# Patient Record
Sex: Female | Born: 1960 | Race: White | Hispanic: No | Marital: Married | Smoking: Former smoker
Health system: Southern US, Community
[De-identification: ages and names within clinical notes are randomized; demographics above are authoritative.]

## PROBLEM LIST (undated history)

## (undated) DIAGNOSIS — E785 Hyperlipidemia, unspecified: Secondary | ICD-10-CM

## (undated) DIAGNOSIS — M1990 Unspecified osteoarthritis, unspecified site: Secondary | ICD-10-CM

## (undated) DIAGNOSIS — K5792 Diverticulitis of intestine, part unspecified, without perforation or abscess without bleeding: Secondary | ICD-10-CM

## (undated) DIAGNOSIS — IMO0002 Reserved for concepts with insufficient information to code with codable children: Secondary | ICD-10-CM

## (undated) DIAGNOSIS — I10 Essential (primary) hypertension: Secondary | ICD-10-CM

## (undated) DIAGNOSIS — K219 Gastro-esophageal reflux disease without esophagitis: Secondary | ICD-10-CM

## (undated) DIAGNOSIS — Z9889 Other specified postprocedural states: Secondary | ICD-10-CM

## (undated) DIAGNOSIS — Z5189 Encounter for other specified aftercare: Secondary | ICD-10-CM

## (undated) DIAGNOSIS — D649 Anemia, unspecified: Secondary | ICD-10-CM

## (undated) DIAGNOSIS — T7840XA Allergy, unspecified, initial encounter: Secondary | ICD-10-CM

## (undated) DIAGNOSIS — I493 Ventricular premature depolarization: Secondary | ICD-10-CM

## (undated) DIAGNOSIS — R112 Nausea with vomiting, unspecified: Secondary | ICD-10-CM

## (undated) DIAGNOSIS — F32A Depression, unspecified: Secondary | ICD-10-CM

## (undated) DIAGNOSIS — F419 Anxiety disorder, unspecified: Secondary | ICD-10-CM

## (undated) DIAGNOSIS — M797 Fibromyalgia: Secondary | ICD-10-CM

## (undated) HISTORY — PX: APPENDECTOMY: SHX54

## (undated) HISTORY — DX: Encounter for other specified aftercare: Z51.89

## (undated) HISTORY — PX: ABDOMINAL HYSTERECTOMY: SHX81

## (undated) HISTORY — PX: AV FISTULA INSERTION W/ RF MAGNETIC GUIDANCE: CATH118308

## (undated) HISTORY — DX: Gastro-esophageal reflux disease without esophagitis: K21.9

## (undated) HISTORY — PX: COLONOSCOPY: SHX174

## (undated) HISTORY — DX: Anemia, unspecified: D64.9

## (undated) HISTORY — PX: SMALL INTESTINE SURGERY: SHX150

## (undated) HISTORY — DX: Allergy, unspecified, initial encounter: T78.40XA

## (undated) HISTORY — DX: Hyperlipidemia, unspecified: E78.5

## (undated) HISTORY — PX: COLON SURGERY: SHX602

## (undated) HISTORY — PX: CHOLECYSTECTOMY: SHX55

## (undated) HISTORY — DX: Reserved for concepts with insufficient information to code with codable children: IMO0002

---

## 1994-04-13 HISTORY — PX: TUBAL LIGATION: SHX77

## 2005-04-13 HISTORY — PX: ABDOMINAL HYSTERECTOMY: SHX81

## 2006-04-13 HISTORY — PX: APPENDECTOMY: SHX54

## 2006-04-13 HISTORY — PX: BLADDER REPAIR: SHX76

## 2006-04-13 HISTORY — PX: VESICOVAGINAL FISTULA CLOSURE: SUR270

## 2008-08-16 DIAGNOSIS — F988 Other specified behavioral and emotional disorders with onset usually occurring in childhood and adolescence: Secondary | ICD-10-CM | POA: Insufficient documentation

## 2009-08-22 DIAGNOSIS — N3942 Incontinence without sensory awareness: Secondary | ICD-10-CM | POA: Insufficient documentation

## 2013-02-28 DIAGNOSIS — N3941 Urge incontinence: Secondary | ICD-10-CM | POA: Insufficient documentation

## 2014-10-31 DIAGNOSIS — F411 Generalized anxiety disorder: Secondary | ICD-10-CM | POA: Insufficient documentation

## 2014-10-31 DIAGNOSIS — F17200 Nicotine dependence, unspecified, uncomplicated: Secondary | ICD-10-CM | POA: Insufficient documentation

## 2014-10-31 DIAGNOSIS — L280 Lichen simplex chronicus: Secondary | ICD-10-CM | POA: Insufficient documentation

## 2014-10-31 DIAGNOSIS — F329 Major depressive disorder, single episode, unspecified: Secondary | ICD-10-CM | POA: Insufficient documentation

## 2014-12-20 DIAGNOSIS — F341 Dysthymic disorder: Secondary | ICD-10-CM | POA: Insufficient documentation

## 2015-02-22 DIAGNOSIS — S29011A Strain of muscle and tendon of front wall of thorax, initial encounter: Secondary | ICD-10-CM | POA: Insufficient documentation

## 2015-02-22 DIAGNOSIS — M940 Chondrocostal junction syndrome [Tietze]: Secondary | ICD-10-CM | POA: Insufficient documentation

## 2015-02-22 DIAGNOSIS — N644 Mastodynia: Secondary | ICD-10-CM | POA: Insufficient documentation

## 2015-11-12 DIAGNOSIS — Z8719 Personal history of other diseases of the digestive system: Secondary | ICD-10-CM | POA: Insufficient documentation

## 2015-11-13 DIAGNOSIS — K5792 Diverticulitis of intestine, part unspecified, without perforation or abscess without bleeding: Secondary | ICD-10-CM | POA: Insufficient documentation

## 2016-07-12 HISTORY — PX: COLONOSCOPY: SHX174

## 2017-02-12 ENCOUNTER — Ambulatory Visit: Payer: Self-pay | Admitting: Physician Assistant

## 2017-02-12 VITALS — BP 110/70 | HR 92 | Temp 98.5°F | Resp 16

## 2017-02-12 DIAGNOSIS — R319 Hematuria, unspecified: Principal | ICD-10-CM

## 2017-02-12 DIAGNOSIS — N390 Urinary tract infection, site not specified: Secondary | ICD-10-CM

## 2017-02-12 LAB — POCT URINALYSIS DIPSTICK
BILIRUBIN UA: NEGATIVE
Glucose, UA: NEGATIVE
KETONES UA: NEGATIVE
NITRITE UA: POSITIVE
PH UA: 7 (ref 5.0–8.0)
PROTEIN UA: NEGATIVE
Spec Grav, UA: 1.01 (ref 1.010–1.025)
Urobilinogen, UA: 0.2 E.U./dL

## 2017-02-12 MED ORDER — FLUCONAZOLE 150 MG PO TABS: ORAL_TABLET | ORAL | 0 refills | Status: DC

## 2017-02-12 MED ORDER — CIPROFLOXACIN HCL 250 MG PO TABS: 250.0000 mg | ORAL_TABLET | Freq: Two times a day (BID) | ORAL | 0 refills | Status: DC

## 2017-02-12 NOTE — Progress Notes (Signed)
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

## 2017-02-15 LAB — URINE CULTURE

## 2017-02-18 ENCOUNTER — Encounter: Payer: Self-pay | Admitting: Physician Assistant

## 2017-02-18 ENCOUNTER — Ambulatory Visit: Payer: Self-pay | Admitting: Physician Assistant

## 2017-02-18 VITALS — BP 134/90 | HR 85 | Temp 98.6°F

## 2017-02-18 DIAGNOSIS — H8110 Benign paroxysmal vertigo, unspecified ear: Secondary | ICD-10-CM

## 2017-02-18 DIAGNOSIS — G8929 Other chronic pain: Secondary | ICD-10-CM

## 2017-02-18 DIAGNOSIS — M2550 Pain in unspecified joint: Secondary | ICD-10-CM

## 2017-02-18 LAB — POCT URINALYSIS DIPSTICK
Bilirubin, UA: NEGATIVE
GLUCOSE UA: NEGATIVE
KETONES UA: NEGATIVE
Leukocytes, UA: NEGATIVE
Nitrite, UA: NEGATIVE
Protein, UA: NEGATIVE
SPEC GRAV UA: 1.025 (ref 1.010–1.025)
Urobilinogen, UA: 0.2 E.U./dL
pH, UA: 7 (ref 5.0–8.0)

## 2017-02-18 NOTE — Progress Notes (Signed)
S: Patient complains of being dizzy, being a little confused, is for getting her words, no headache, no slurred speech, no loss of use of limbs, no chest pain or shortness of breath. Patient recently started amitriptyline. She was also on Cipro for UTI. She has multiple areas of pain. She was tested for rheumatoid arthritis and lupus which were both negative. Her doctor had referred her to rheumatology but she moved during that time. States she does have joint pain in her hands, elbows, and some in her back. 2. Also has some anterior throat pain. States it hurts to swallow, hurts to touch. She is a smoker and is concerned about what may be causing the pain  O: vitals wnl, nad, perrl, speech is normal, patient is alert and oriented 3. TMs are clear lungs are clear to auscultation, cv rrr, neuro is grossly intact, goals are swollen bilaterally, left elbow is a little tender to palpation. UA is normal  A:? medication reaction to elavil, throat pain, joint pain  P: labs today, refer to ENT for throat pain, stop elavil, if sx worsen pt is to go to ER

## 2017-02-19 LAB — CMP12+LP+TP+TSH+6AC+CBC/D/PLT
A/G RATIO: 1.7 (ref 1.2–2.2)
ALT: 16 IU/L (ref 0–32)
AST: 17 IU/L (ref 0–40)
Albumin: 4.4 g/dL (ref 3.5–5.5)
Alkaline Phosphatase: 89 IU/L (ref 39–117)
BASOS ABS: 0 10*3/uL (ref 0.0–0.2)
BUN/Creatinine Ratio: 11 (ref 9–23)
BUN: 8 mg/dL (ref 6–24)
Basos: 1 %
Bilirubin Total: <0.2 mg/dL (ref 0.0–1.2)
CALCIUM: 9.4 mg/dL (ref 8.7–10.2)
CREATININE: 0.72 mg/dL (ref 0.57–1.00)
Chloride: 103 mmol/L (ref 96–106)
Chol/HDL Ratio: 5.1 ratio — ABNORMAL HIGH (ref 0.0–4.4)
Cholesterol, Total: 215 mg/dL — ABNORMAL HIGH (ref 100–199)
EOS (ABSOLUTE): 0.2 10*3/uL (ref 0.0–0.4)
ESTIMATED CHD RISK: 1.4 times avg. — AB (ref 0.0–1.0)
Eos: 2 %
Free Thyroxine Index: 1.8 (ref 1.2–4.9)
GFR calc Af Amer: 110 mL/min/{1.73_m2} (ref >59)
GFR, EST NON AFRICAN AMERICAN: 95 mL/min/{1.73_m2} (ref >59)
GGT: 27 IU/L (ref 0–60)
GLUCOSE: 99 mg/dL (ref 65–99)
Globulin, Total: 2.6 g/dL (ref 1.5–4.5)
HDL: 42 mg/dL (ref >39)
Hematocrit: 38.4 % (ref 34.0–46.6)
Hemoglobin: 13.1 g/dL (ref 11.1–15.9)
IRON: 56 ug/dL (ref 27–159)
Immature Grans (Abs): 0 10*3/uL (ref 0.0–0.1)
Immature Granulocytes: 0 %
LDH: 210 IU/L (ref 119–226)
LDL Calculated: 145 mg/dL — ABNORMAL HIGH (ref 0–99)
LYMPHS ABS: 3.4 10*3/uL — AB (ref 0.7–3.1)
Lymphs: 46 %
MCH: 32.3 pg (ref 26.6–33.0)
MCHC: 34.1 g/dL (ref 31.5–35.7)
MCV: 95 fL (ref 79–97)
Monocytes Absolute: 0.4 10*3/uL (ref 0.1–0.9)
Monocytes: 5 %
NEUTROS ABS: 3.5 10*3/uL (ref 1.4–7.0)
Neutrophils: 46 %
PHOSPHORUS: 4.3 mg/dL (ref 2.5–4.5)
PLATELETS: 296 10*3/uL (ref 150–379)
POTASSIUM: 4.1 mmol/L (ref 3.5–5.2)
RBC: 4.05 x10E6/uL (ref 3.77–5.28)
RDW: 13.7 % (ref 12.3–15.4)
Sodium: 141 mmol/L (ref 134–144)
T3 UPTAKE RATIO: 22 % — AB (ref 24–39)
T4 TOTAL: 8.1 ug/dL (ref 4.5–12.0)
TOTAL PROTEIN: 7 g/dL (ref 6.0–8.5)
TSH: 1.35 u[IU]/mL (ref 0.450–4.500)
Triglycerides: 139 mg/dL (ref 0–149)
URIC ACID: 2.6 mg/dL (ref 2.5–7.1)
VLDL Cholesterol Cal: 28 mg/dL (ref 5–40)
WBC: 7.5 10*3/uL (ref 3.4–10.8)

## 2017-02-19 LAB — B. BURGDORFI ANTIBODIES

## 2017-02-19 LAB — VITAMIN D 25 HYDROXY (VIT D DEFICIENCY, FRACTURES): VIT D 25 HYDROXY: 25.5 ng/mL — AB (ref 30.0–100.0)

## 2017-02-19 LAB — VITAMIN B12: Vitamin B-12: 418 pg/mL (ref 232–1245)

## 2017-03-03 NOTE — Progress Notes (Signed)
Per Neysa Bonitohristy at [facility] patient's appointment is 03/19/17 @ 9:15am.  I called and left a message on the patient's voicemail.

## 2017-05-07 DIAGNOSIS — K5790 Diverticulosis of intestine, part unspecified, without perforation or abscess without bleeding: Secondary | ICD-10-CM | POA: Insufficient documentation

## 2017-05-07 DIAGNOSIS — M797 Fibromyalgia: Secondary | ICD-10-CM | POA: Insufficient documentation

## 2017-05-07 DIAGNOSIS — G8929 Other chronic pain: Secondary | ICD-10-CM | POA: Insufficient documentation

## 2017-05-21 ENCOUNTER — Encounter: Payer: Self-pay | Admitting: *Deleted

## 2017-05-25 ENCOUNTER — Other Ambulatory Visit: Payer: Self-pay | Admitting: Internal Medicine

## 2017-05-25 DIAGNOSIS — K5792 Diverticulitis of intestine, part unspecified, without perforation or abscess without bleeding: Secondary | ICD-10-CM

## 2017-05-27 ENCOUNTER — Emergency Department: Payer: Managed Care, Other (non HMO)

## 2017-05-27 ENCOUNTER — Encounter: Payer: Self-pay | Admitting: Emergency Medicine

## 2017-05-27 ENCOUNTER — Emergency Department
Admission: EM | Admit: 2017-05-27 | Discharge: 2017-05-27 | Disposition: A | Discharge status: Home / Self Care | Payer: Managed Care, Other (non HMO) | Attending: Emergency Medicine | Admitting: Emergency Medicine

## 2017-05-27 ENCOUNTER — Other Ambulatory Visit: Payer: Self-pay

## 2017-05-27 DIAGNOSIS — Z79899 Other long term (current) drug therapy: Secondary | ICD-10-CM | POA: Insufficient documentation

## 2017-05-27 DIAGNOSIS — R109 Unspecified abdominal pain: Secondary | ICD-10-CM

## 2017-05-27 DIAGNOSIS — R1030 Lower abdominal pain, unspecified: Secondary | ICD-10-CM | POA: Diagnosis not present

## 2017-05-27 DIAGNOSIS — F17210 Nicotine dependence, cigarettes, uncomplicated: Secondary | ICD-10-CM | POA: Diagnosis not present

## 2017-05-27 HISTORY — DX: Diverticulitis of intestine, part unspecified, without perforation or abscess without bleeding: K57.92

## 2017-05-27 LAB — URINALYSIS, COMPLETE (UACMP) WITH MICROSCOPIC
BILIRUBIN URINE: NEGATIVE
Glucose, UA: NEGATIVE mg/dL
KETONES UR: NEGATIVE mg/dL
LEUKOCYTES UA: NEGATIVE
Nitrite: NEGATIVE
Protein, ur: NEGATIVE mg/dL
SPECIFIC GRAVITY, URINE: 1.005 (ref 1.005–1.030)
pH: 7 (ref 5.0–8.0)

## 2017-05-27 LAB — CBC WITH DIFFERENTIAL/PLATELET
BASOS ABS: 0.1 10*3/uL (ref 0–0.1)
BASOS PCT: 1 %
Eosinophils Absolute: 0.2 10*3/uL (ref 0–0.7)
Eosinophils Relative: 3 %
HEMATOCRIT: 43.2 % (ref 35.0–47.0)
HEMOGLOBIN: 14.5 g/dL (ref 12.0–16.0)
Lymphocytes Relative: 33 %
Lymphs Abs: 2.2 10*3/uL (ref 1.0–3.6)
MCH: 32.7 pg (ref 26.0–34.0)
MCHC: 33.6 g/dL (ref 32.0–36.0)
MCV: 97.2 fL (ref 80.0–100.0)
MONOS PCT: 6 %
Monocytes Absolute: 0.4 10*3/uL (ref 0.2–0.9)
NEUTROS ABS: 4 10*3/uL (ref 1.4–6.5)
NEUTROS PCT: 57 %
Platelets: 298 10*3/uL (ref 150–440)
RBC: 4.44 MIL/uL (ref 3.80–5.20)
RDW: 12.7 % (ref 11.5–14.5)
WBC: 6.9 10*3/uL (ref 3.6–11.0)

## 2017-05-27 LAB — COMPREHENSIVE METABOLIC PANEL
ALK PHOS: 76 U/L (ref 38–126)
ALT: 22 U/L (ref 14–54)
AST: 37 U/L (ref 15–41)
Albumin: 4.5 g/dL (ref 3.5–5.0)
Anion gap: 9 (ref 5–15)
BILIRUBIN TOTAL: 0.8 mg/dL (ref 0.3–1.2)
BUN: 12 mg/dL (ref 6–20)
CALCIUM: 9.6 mg/dL (ref 8.9–10.3)
CO2: 26 mmol/L (ref 22–32)
Chloride: 104 mmol/L (ref 101–111)
Creatinine, Ser: 0.75 mg/dL (ref 0.44–1.00)
GFR calc Af Amer: >60 mL/min (ref >60)
GFR calc non Af Amer: >60 mL/min (ref >60)
GLUCOSE: 100 mg/dL — AB (ref 65–99)
Potassium: 3.8 mmol/L (ref 3.5–5.1)
SODIUM: 139 mmol/L (ref 135–145)
TOTAL PROTEIN: 8.2 g/dL — AB (ref 6.5–8.1)

## 2017-05-27 LAB — LIPASE, BLOOD: Lipase: 29 U/L (ref 11–51)

## 2017-05-27 MED ORDER — KETOROLAC TROMETHAMINE 30 MG/ML IJ SOLN
15.0000 mg | Freq: Once | INTRAMUSCULAR | Status: AC
  Administered 2017-05-27: 15 mg via INTRAVENOUS
  Filled 2017-05-27: qty 1

## 2017-05-27 MED ORDER — ONDANSETRON HCL 4 MG/2ML IJ SOLN
4.0000 mg | Freq: Once | INTRAMUSCULAR | Status: AC
Start: 2017-05-27 — End: 2017-05-27
  Administered 2017-05-27: 4 mg via INTRAVENOUS
  Filled 2017-05-27: qty 2

## 2017-05-27 MED ORDER — IOPAMIDOL (ISOVUE-300) INJECTION 61%
100.0000 mL | Freq: Once | INTRAVENOUS | Status: AC | PRN
  Administered 2017-05-27: 100 mL via INTRAVENOUS

## 2017-05-27 MED ORDER — ONDANSETRON 4 MG PO TBDP: 4.0000 mg | ORAL_TABLET | Freq: Three times a day (TID) | ORAL | 0 refills | Status: DC | PRN

## 2017-05-27 NOTE — ED Provider Notes (Signed)
[facility] Emergency Department Provider Note  ____________________________________________  Time seen: Approximately 9:08 AM  I have reviewed the triage vital signs and the nursing notes.   HISTORY  Chief Complaint Abdominal Pain; Nausea; Emesis; and Diarrhea   HPI Allison Alvarez is a 57 y.o. female with history of diverticulitis presents for evaluation of abdominal pain. Patient reports that this is her seventh bouts of diverticulitis in the last 13 months. This one started a week ago. 5 days ago she was started on Cipro and Flagyl by her PCP. Since yesterday evening the pain has gotten worse. She is complaining of pain on the right side of her abdomen, 5 out of 10, constant, nonradiating. She is also complaining of pressure in her suprapubic region but no dysuria or hematuria. She has had nausea but no vomiting, no fever or chills, no diarrhea or constipation.  Past Medical History:  Diagnosis Date  . Diverticulitis     There are no active problems to display for this patient.   Past Surgical History:  Procedure Laterality Date  . ABDOMINAL HYSTERECTOMY    . APPENDECTOMY    . AV FISTULA INSERTION W/ RF MAGNETIC GUIDANCE    . BLADDER REPAIR    . CHOLECYSTECTOMY      Prior to Admission medications   Medication Sig Start Date End Date Taking? Authorizing Provider  Acidophilus Lactobacillus CAPS Take 1 capsule by mouth every morning.   Yes [provider]  ciprofloxacin (CIPRO) 500 MG tablet Take 1 tablet by mouth 2 (two) times daily. For 14 days 05/21/17 06/04/17 Yes [provider]  citalopram (CELEXA) 40 MG tablet TAKE 1 TABLET(40 MG) BY MOUTH DAILY 01/30/17  Yes [provider]  magnesium oxide (MAG-OX) 400 MG tablet Take 400 mg by mouth daily.   Yes [provider]  metroNIDAZOLE (FLAGYL) 500 MG tablet Take 1 tablet by mouth 2 (two) times daily. For 14 days 05/21/17  Yes [provider]  Omega-3 Fatty  Acids (FISH OIL/SUPER POTENT/NO BURP) 1000 MG CAPS Take 1 capsule by mouth daily.   Yes [provider]  fluconazole (DIFLUCAN) 150 MG tablet Take one now and one in a week Patient not taking: Reported on 02/18/2017 02/12/17   Faythe Ghee, PA-C  ondansetron (ZOFRAN ODT) 4 MG disintegrating tablet Take 1 tablet (4 mg total) by mouth every 8 (eight) hours as needed for nausea or vomiting. 05/27/17   Nita Sickle, MD    Allergies Morphine and Hydromorphone  History reviewed. No pertinent family history.  Social History Social History   Tobacco Use  . Smoking status: Current Every Day Smoker    Packs/day: 1.00  . Smokeless tobacco: Never Used  Substance Use Topics  . Alcohol use: No    Frequency: Never  . Drug use: No    Review of Systems  Constitutional: Negative for fever. Eyes: Negative for visual changes. ENT: Negative for sore throat. Neck: No neck pain  Cardiovascular: Negative for chest pain. Respiratory: Negative for shortness of breath. Gastrointestinal: + abdominal pain and nausea. No vomiting or diarrhea. Genitourinary: Negative for dysuria. Musculoskeletal: Negative for back pain. Skin: Negative for rash. Neurological: Negative for headaches, weakness or numbness. Psych: No SI or HI  ____________________________________________   PHYSICAL EXAM:  VITAL SIGNS: ED Triage Vitals  Enc Vitals Group     BP 05/27/17 0847 139/88     Pulse Rate 05/27/17 0847 73     Resp 05/27/17 0847 20     Temp  05/27/17 0847 98.2 F (36.8 C)     Temp Source 05/27/17 0847 Oral     SpO2 05/27/17 0847 98 %     Weight 05/27/17 0848 177 lb (80.3 kg)     Height 05/27/17 0848 5\' 8"  (1.727 m)     Head Circumference --      Peak Flow --      Pain Score 05/27/17 0847 6     Pain Loc --      Pain Edu? --      Excl. in GC? --     Constitutional: Alert and oriented. Well appearing and in no apparent distress. HEENT:      Head: Normocephalic and atraumatic.          Eyes: Conjunctivae are normal. Sclera is non-icteric.       Mouth/Throat: Mucous membranes are moist.       Neck: Supple with no signs of meningismus. Cardiovascular: Regular rate and rhythm. No murmurs, gallops, or rubs. 2+ symmetrical distal pulses are present in all extremities. No JVD. Respiratory: Normal respiratory effort. Lungs are clear to auscultation bilaterally. No wheezes, crackles, or rhonchi.  Gastrointestinal: Soft, mild R sided ttp, and non distended with positive bowel sounds. No rebound or guarding. Genitourinary: No CVA tenderness. Musculoskeletal: Nontender with normal range of motion in all extremities. No edema, cyanosis, or erythema of extremities. Neurologic: Normal speech and language. Face is symmetric. Moving all extremities. No gross focal neurologic deficits are appreciated. Skin: Skin is warm, dry and intact. No rash noted. Psychiatric: Mood and affect are normal. Speech and behavior are normal.  ____________________________________________   LABS (all labs ordered are listed, but only abnormal results are displayed)  Labs Reviewed  COMPREHENSIVE METABOLIC PANEL - Abnormal; Notable for the following components:      Result Value   Glucose, Bld 100 (*)    Total Protein 8.2 (*)    All other components within normal limits  URINALYSIS, COMPLETE (UACMP) WITH MICROSCOPIC - Abnormal; Notable for the following components:   Color, Urine YELLOW (*)    APPearance CLEAR (*)    Hgb urine dipstick SMALL (*)    Bacteria, UA RARE (*)    Squamous Epithelial / LPF 0-5 (*)    All other components within normal limits  CBC WITH DIFFERENTIAL/PLATELET  LIPASE, BLOOD   ____________________________________________  EKG  none  ____________________________________________  RADIOLOGY  Interpreted by me: CT a/p: CT scan showing diverticulosis with no evidence of acute diverticulitis or complications.   Interpretation by Radiologist:  Ct Abdomen Pelvis W  Contrast  Result Date: 05/27/2017 CLINICAL DATA:  Right-sided abdominal pain. History of diverticulitis. EXAM: CT ABDOMEN AND PELVIS WITH CONTRAST TECHNIQUE: Multidetector CT imaging of the abdomen and pelvis was performed using the standard protocol following bolus administration of intravenous contrast. CONTRAST:  ISOVUE-300 IOPAMIDOL (ISOVUE-300) INJECTION 61% COMPARISON:  CT abdomen pelvis dated January 16, 2015. FINDINGS: Lower chest: No acute abnormality. Hepatobiliary: No focal liver abnormality is seen. Status post cholecystectomy. No biliary dilatation. Pancreas: Unremarkable. No pancreatic ductal dilatation or surrounding inflammatory changes. Spleen: Normal in size without focal abnormality. Adrenals/Urinary Tract: Adrenal glands are unremarkable. Kidneys are normal, without renal calculi, focal lesion, or hydronephrosis. Bladder is unremarkable. Stomach/Bowel: Stomach is within normal limits. Appendix is surgically absent. No evidence of bowel wall thickening, distention, or inflammatory changes. Moderate left-sided colonic diverticulosis. Vascular/Lymphatic: No significant vascular findings are present. No enlarged abdominal or pelvic lymph nodes. Reproductive: Status post hysterectomy. No adnexal masses. 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 Other: No free fluid or pneumoperitoneum. Musculoskeletal: No acute or significant osseous findings. IMPRESSION: 1.  No acute intra-abdominal process. 2. Moderate left-sided colonic diverticulosis without evidence of diverticulitis. 3. Unchanged urethral diverticulum. Electronically Signed   By: Obie DredgeWilliam T Derry M.D.   On: 05/27/2017 10:13      ____________________________________________   PROCEDURES  Procedure(s) performed: None Procedures Critical Care performed:   None ____________________________________________   INITIAL IMPRESSION / ASSESSMENT AND PLAN / ED COURSE   57 y.o. female with history of diverticulitis presents for evaluation of worsening abdominal pain and nausea after 5 days of flagyl and cipro for diverticulitis. Patient is well-appearing, in no distress, she has normal vital signs, abdomen is soft with mild tenderness on the right quadrants, no rebound or guarding. We'll check labs and do a CT abdomen and pelvis to rule out any complications from diverticulitis. Patient is status post cholecystectomy and appendectomy.    _________________________ 10:23 AM on 05/27/2017 -----------------------------------------  CT scan showing diverticulosis with no acute diverticulitis or any other complications. Labs are within normal limits. Abdomen remains benign. At this time I recommended patient continues to take the Flagyl and Cipro she was started on by her PCP. I will provide her with a prescription for Zofran. She will follow-up with her surgeon. Discussed return precautions with patient and her husband. Patient is now stable for discharge.   As part of my medical decision making, I reviewed the following data within the electronic MEDICAL RECORD NUMBER Nursing notes reviewed and incorporated, Labs reviewed , Radiograph reviewed , Notes from prior ED visits and [place] Controlled Substance Database    Pertinent labs & imaging results that were available during my care of the patient were reviewed by me and considered in my medical decision making (see chart for details).    ____________________________________________   FINAL CLINICAL IMPRESSION(S) / ED DIAGNOSES  Final diagnoses:  Abdominal pain, unspecified abdominal location      NEW MEDICATIONS STARTED DURING THIS VISIT:  ED Discharge Orders        Ordered    ondansetron (ZOFRAN ODT) 4 MG disintegrating tablet  Every 8 hours PRN     05/27/17 1022       Note:  This document was  prepared using Dragon voice recognition software and may include unintentional dictation errors.    Don PerkingVeronese, [place], MD 05/27/17 1024

## 2017-05-27 NOTE — Discharge Instructions (Signed)

## 2017-05-27 NOTE — ED Notes (Signed)
First nurse note  [facility]  Has been treat for diverticulitis and on 6 days of cipro and flagyl  No improvement  States pain is moving from LLQ to RLQ

## 2017-05-27 NOTE — ED Triage Notes (Signed)
Pt reports that she has diverticulitis has been on Flagyl and Cipro for this. She has had this 7 times since last Jan. She has been referred for a resection. She is waiting on call back from Dr. Birdie SonsByrnette. She is scheduled for a CT on the 22 of Feb. States symptoms are getting worse.

## 2017-05-27 NOTE — ED Notes (Signed)
Patient ambulatory to lobby with steady gait and NAD noted. Verbalized understanding of discharge instructions and follow-up care.  

## 2017-06-04 ENCOUNTER — Ambulatory Visit: Payer: Self-pay

## 2017-06-15 ENCOUNTER — Ambulatory Visit: Payer: Managed Care, Other (non HMO) | Admitting: General Surgery

## 2017-06-15 ENCOUNTER — Encounter: Payer: Self-pay | Admitting: General Surgery

## 2017-06-15 VITALS — BP 140/80 | HR 79 | Resp 12 | Ht 68.0 in | Wt 180.0 lb

## 2017-06-15 DIAGNOSIS — K5732 Diverticulitis of large intestine without perforation or abscess without bleeding: Secondary | ICD-10-CM | POA: Diagnosis not present

## 2017-06-15 NOTE — Progress Notes (Signed)
Patient ID: Allison Alvarez, female   DOB: 08-13-1960, 56 y.o.   MRN: [id number]  Chief Complaint  Patient presents with  . Other    HPI Allison Alvarez is a 57 y.o. female here today for a evaluation of diverticulitis. Patient states she has been having abdmonial pain for the last two years in her left lower quadrant and radiations to her back. Her first spell was after eating popcorn. She has had about seven episodes. She moves [place] where her doctor told her she need a colon resect. She had a colonoscopy last April 2018. Moves her bowels daily. Ct scan done on 05/27/2017.   Mother, Allison Alvarez  Is present at visit.   HPI  Past Medical History:  Diagnosis Date  . Diverticulitis     Past Surgical History:  Procedure Laterality Date  . ABDOMINAL HYSTERECTOMY    . APPENDECTOMY    . AV FISTULA INSERTION W/ RF MAGNETIC GUIDANCE    . BLADDER REPAIR    . CHOLECYSTECTOMY    . COLONOSCOPY  07/2016    No family history on file.  Social History Social History   Tobacco Use  . Smoking status: Current Every Day Smoker    Packs/day: 1.00  . Smokeless tobacco: Never Used  Substance Use Topics  . Alcohol use: No    Frequency: Never  . Drug use: No    Allergies  Allergen Reactions  . Morphine Itching  . Hydromorphone Itching    Current Outpatient Medications  Medication Sig Dispense Refill  . Acidophilus Lactobacillus CAPS Take 1 capsule by mouth every morning.    . citalopram (CELEXA) 40 MG tablet TAKE 1 TABLET(40 MG) BY MOUTH DAILY    . fluconazole (DIFLUCAN) 150 MG tablet Take one now and one in a week (Patient not taking: Reported on 02/18/2017) 2 tablet 0  . magnesium oxide (MAG-OX) 400 MG tablet Take 400 mg by mouth daily.    . metroNIDAZOLE (FLAGYL) 500 MG tablet Take 1 tablet by mouth 2 (two) times daily. For 14 days  0  . Omega-3 Fatty Acids (FISH OIL/SUPER POTENT/NO BURP) 1000 MG CAPS Take 1 capsule by mouth daily.    . ondansetron (ZOFRAN ODT) 4 MG disintegrating  tablet Take 1 tablet (4 mg total) by mouth every 8 (eight) hours as needed for nausea or vomiting. 20 tablet 0   No current facility-administered medications for this visit.     Review of Systems Review of Systems  Constitutional: Negative.   Respiratory: Negative.   Gastrointestinal: Positive for abdominal pain and nausea.    Blood pressure 140/80, pulse 79, resp. rate 12, height 5\' 8"  (1.727 m), weight 180 lb (81.6 kg).  Physical Exam Physical Exam  Constitutional: She is oriented to person, place, and time. She appears well-developed and well-nourished.  Eyes: Conjunctivae are normal. No scleral icterus.  Neck: Neck supple.  Cardiovascular: Normal rate, regular rhythm and normal heart sounds.  Pulmonary/Chest: Effort normal and breath sounds normal. Right breast exhibits no inverted nipple, no mass, no nipple discharge, no skin change and no tenderness. Left breast exhibits no inverted nipple, no mass, no nipple discharge, no skin change and no tenderness.  Abdominal: Soft. Bowel sounds are normal. There is tenderness.    Lymphadenopathy:    She has no cervical adenopathy.    She has no axillary adenopathy.  Neurological: She is alert and oriented to person, place, and time.  Skin: Skin is warm and dry.    Data Reviewed Laboratory studies dated May 27, 2017 showed normal electrolytes and renal function, creatinine 0.75 with an estimated GFR greater than 60.  Normal liver function studies.  Nonfasting blood sugar 100.  CBC of the same date showed a white blood cell count of 6900 with 57% polys, 33% lymphocytes.  Hemoglobin 14.5 with a platelet count of 298,000.  Lipase normal at 29.  CT scan of the abdomen and pelvis dated May 27, 2017 was reviewed.  No evidence of acute diverticular inflammation.  Evidence of diverticuli especially in the sigmoid colon appreciated. No acute intra-abdominal process.  Assessment    Multiple episodes of diverticulitis by report,  possible inflammation or so than infection on her most recent bout in February 2019.    Plan Reviewed physiology of diverticulitis as both possibly of infectious or inflammation.  She reported that her symptoms when she presented to the ED on February 14 were similar to her past episodes when she was treated with antibiotics.  Laboratory and CT imaging does not support acute diverticulitis at the time of her February 2019 exam.  Before moving to sigmoid colectomy, would like to make use of a trial of a high-fiber diet to see if this improves her symptoms.  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  It may be difficult for her to stop smoking, although the effects of smoking both on her episodes of diverticulitis and potential recovery from surgery was reviewed.  Patient to return in one month. The patient will be encouraged to make use of a daily fiber supplement. The patient is aware to call back for any questions or concerns.   HPI, Physical Exam, Assessment and Plan have been scribed under the direction and in the presence of Donnalee CurryJeffrey Ilynn Stauffer, MD.  Ples SpecterJessica Qualls, CMA  I have completed the exam and reviewed the above documentation for accuracy and completeness.  I agree with the above.  [employer] has been used and any errors in dictation or transcription are unintentional.  Donnalee CurryJeffrey Eldo Umanzor, M.D., F.A.C.S.  Merrily PewJeffrey W Cimone Fahey 06/16/2017, 8:15 PM

## 2017-06-15 NOTE — Patient Instructions (Signed)
Patient to return in one month. The patient will be encouraged to make use of a daily fiber supplement. . Less smoking is better . The patient is aware to call back for any questions or concerns.

## 2017-06-16 DIAGNOSIS — K5732 Diverticulitis of large intestine without perforation or abscess without bleeding: Secondary | ICD-10-CM | POA: Insufficient documentation

## 2017-07-08 ENCOUNTER — Other Ambulatory Visit
Admission: RE | Admit: 2017-07-08 | Discharge: 2017-07-08 | Disposition: A | Discharge status: Home / Self Care | Payer: Managed Care, Other (non HMO) | Source: Ambulatory Visit | Attending: General Surgery | Admitting: General Surgery

## 2017-07-08 ENCOUNTER — Telehealth: Payer: Self-pay | Admitting: *Deleted

## 2017-07-08 DIAGNOSIS — K5732 Diverticulitis of large intestine without perforation or abscess without bleeding: Secondary | ICD-10-CM

## 2017-07-08 LAB — CBC WITH DIFFERENTIAL/PLATELET
BASOS PCT: 1 %
Basophils Absolute: 0.1 10*3/uL (ref 0–0.1)
Eosinophils Absolute: 0.2 10*3/uL (ref 0–0.7)
Eosinophils Relative: 2 %
HEMATOCRIT: 37.2 % (ref 35.0–47.0)
Hemoglobin: 12.9 g/dL (ref 12.0–16.0)
LYMPHS PCT: 32 %
Lymphs Abs: 2.8 10*3/uL (ref 1.0–3.6)
MCH: 33.3 pg (ref 26.0–34.0)
MCHC: 34.5 g/dL (ref 32.0–36.0)
MCV: 96.3 fL (ref 80.0–100.0)
MONOS PCT: 4 %
Monocytes Absolute: 0.4 10*3/uL (ref 0.2–0.9)
NEUTROS ABS: 5.3 10*3/uL (ref 1.4–6.5)
Neutrophils Relative %: 61 %
Platelets: 253 10*3/uL (ref 150–440)
RBC: 3.86 MIL/uL (ref 3.80–5.20)
RDW: 12.9 % (ref 11.5–14.5)
WBC: 8.7 10*3/uL (ref 3.6–11.0)

## 2017-07-08 NOTE — Telephone Encounter (Signed)
Dr Lemar LivingsByrnett would like to obtain the hard copy colonoscopy report from April 2018. Patient states [facility] Dr Micah NoelLance was who did this and she will call to get that sent over, aware original hard color copy needed, pt agrees. [facility], order sent.

## 2017-07-08 NOTE — Telephone Encounter (Signed)
Patient called stating that Dr.Byrnett treats her for diverticulitis and the past 3 days she has noticed blood when she has a BM. The first 2 days it was just in the toilet, bright red blood, and then today was in the toilet and also in the stool. She has no pain with it but occasionally she has some stabbing pain on the lower left side of her abdomen but patient stated that it wasn't anything to cause to her bend over during the pain. She has an appointment on 07/15/17 but she didn't know if she needs to be seen sooner or if there was something she can do or take until then.

## 2017-07-10 ENCOUNTER — Encounter: Payer: Self-pay | Admitting: General Surgery

## 2017-07-13 NOTE — Telephone Encounter (Signed)
Yes, your labs were good, follow up as scheduled. thanks

## 2017-07-13 NOTE — Telephone Encounter (Deleted)
Labs are good follow up 07-15-17 as planned.

## 2017-07-15 ENCOUNTER — Ambulatory Visit: Payer: Managed Care, Other (non HMO) | Admitting: General Surgery

## 2017-10-26 ENCOUNTER — Other Ambulatory Visit: Payer: Self-pay | Admitting: Internal Medicine

## 2017-10-26 DIAGNOSIS — N644 Mastodynia: Secondary | ICD-10-CM

## 2017-10-27 ENCOUNTER — Encounter: Payer: Self-pay | Admitting: *Deleted

## 2017-11-08 ENCOUNTER — Other Ambulatory Visit: Payer: Self-pay | Admitting: *Deleted

## 2017-11-08 ENCOUNTER — Other Ambulatory Visit: Payer: Self-pay | Admitting: Internal Medicine

## 2017-11-08 ENCOUNTER — Inpatient Hospital Stay
Admission: RE | Admit: 2017-11-08 | Discharge: 2017-11-08 | Disposition: A | Discharge status: Home / Self Care | Payer: Self-pay | Source: Ambulatory Visit | Attending: *Deleted | Admitting: *Deleted

## 2017-11-08 DIAGNOSIS — N644 Mastodynia: Secondary | ICD-10-CM

## 2017-11-08 DIAGNOSIS — Z1231 Encounter for screening mammogram for malignant neoplasm of breast: Secondary | ICD-10-CM

## 2017-11-11 ENCOUNTER — Ambulatory Visit
Admission: RE | Admit: 2017-11-11 | Discharge: 2017-11-11 | Disposition: A | Discharge status: Home / Self Care | Payer: Managed Care, Other (non HMO) | Source: Ambulatory Visit | Attending: Internal Medicine | Admitting: Internal Medicine

## 2017-11-11 DIAGNOSIS — N644 Mastodynia: Secondary | ICD-10-CM

## 2017-12-02 ENCOUNTER — Ambulatory Visit: Payer: Managed Care, Other (non HMO) | Admitting: General Surgery

## 2018-01-06 ENCOUNTER — Ambulatory Visit: Payer: Self-pay | Admitting: Family Medicine

## 2018-01-06 VITALS — BP 130/70 | HR 72 | Temp 98.6°F | Resp 16

## 2018-01-06 DIAGNOSIS — R059 Cough, unspecified: Secondary | ICD-10-CM

## 2018-01-06 DIAGNOSIS — R05 Cough: Secondary | ICD-10-CM

## 2018-01-06 DIAGNOSIS — H6983 Other specified disorders of Eustachian tube, bilateral: Secondary | ICD-10-CM

## 2018-01-06 MED ORDER — FLUTICASONE PROPIONATE 50 MCG/ACT NA SUSP: 2.0000 | Freq: Every day | NASAL | 1 refills | Status: DC

## 2018-01-06 MED ORDER — BENZONATATE 200 MG PO CAPS: 200.0000 mg | ORAL_CAPSULE | Freq: Every evening | ORAL | 0 refills | Status: DC | PRN

## 2018-01-06 NOTE — Progress Notes (Signed)
Subjective: congestion     Allison Alvarez is a 57 y.o. female who presents for evaluation of nasal congestion with clear drainage, productive cough with green sputum, bilateral ear pressure/fullness, and sneezing for 1 week.  Patient reports a slow gradual increase in ear pressure, otherwise improvement in other symptoms. Treatment to date: Alka-Seltzer, NyQuil, DayQuil.  Denies rash, nausea, vomiting, diarrhea, SOB, wheezing, chest or back pain, ear pain, sore throat, difficulty swallowing, confusion, purulent nasal discharge, anosmia/hyposmia, dental pain, facial pressure/unilateral facial pain, pain exacerbated by bending over, headache, body aches, fatigue, fever, chills, severe symptoms, or initial improvement and then worsening of symptoms. History of smoking, asthma, COPD: Positive for tobacco use only.  Patient has smoked since she was in her 31s, smokes approximately a pack a day. History of recurrent sinus and/or lung infections: Negative.  Review of Systems Pertinent items noted in HPI and remainder of comprehensive ROS otherwise negative.     Objective:   Physical Exam General: Awake, alert, and oriented. No acute distress. Well developed, hydrated and nourished. Appears stated age. Nontoxic appearance.  HEENT: Clear PND noted. No erythema to posterior oropharynx.  No edema or exudates of pharynx or tonsils. No erythema or bulging of TM.  Clear air/fluid level noted to bilateral tympanic membranes.  Ear canals normal. Mild erythema/edema to nasal mucosa. Sinuses nontender. Supple neck without adenopathy. Cardiac: Heart rate and rhythm are normal. No murmurs, gallops, or rubs are auscultated. S1 and S2 are heard and are of normal intensity.  Respiratory: No signs of respiratory distress. Lungs clear. No tachypnea. Able to speak in full sentences without dyspnea. Nonlabored respirations.  Skin: Skin is warm, dry and intact. Appropriate color for ethnicity. No cyanosis noted.   Assessment:    Viral upper respiratory illness   Bilateral eustachian tube dysfunction  Plan:    Discussed diagnosis and treatment of URI and eustachian tube dysfunction. Discussed the importance of avoiding unnecessary antibiotic therapy. Suggested symptomatic OTC remedies. Nasal saline spray for congestion.   Prescribed Flonase and educated patient regarding instructions and side/adverse effects.  Patient has taken this in the past and tolerated it well. Prescribed Tessalon Perles to use as needed at night for cough.  Discussed side/adverse effects. Discussed red flag symptoms and circumstances with which to seek medical care.   New Prescriptions   BENZONATATE (TESSALON) 200 MG CAPSULE    Take 1 capsule (200 mg total) by mouth at bedtime as needed for cough.   FLUTICASONE (FLONASE) 50 MCG/ACT NASAL SPRAY    Place 2 sprays into both nostrils daily.

## 2018-01-27 ENCOUNTER — Telehealth: Payer: Self-pay

## 2018-01-27 NOTE — Telephone Encounter (Signed)
Message left for patient to call to reschedule her missed appointment with Dr Lemar Livings from 12/02/2017 for breast follow up.

## 2018-02-23 ENCOUNTER — Ambulatory Visit
Admission: RE | Admit: 2018-02-23 | Discharge: 2018-02-23 | Disposition: A | Discharge status: Home / Self Care | Payer: Managed Care, Other (non HMO) | Source: Ambulatory Visit | Attending: Family Medicine | Admitting: Family Medicine

## 2018-02-23 ENCOUNTER — Ambulatory Visit: Payer: Self-pay | Admitting: Family Medicine

## 2018-02-23 VITALS — BP 140/82 | HR 82 | Temp 98.5°F | Resp 14 | Ht 68.0 in | Wt 175.0 lb

## 2018-02-23 DIAGNOSIS — R05 Cough: Secondary | ICD-10-CM | POA: Diagnosis not present

## 2018-02-23 DIAGNOSIS — Z87891 Personal history of nicotine dependence: Secondary | ICD-10-CM | POA: Insufficient documentation

## 2018-02-23 DIAGNOSIS — R059 Cough, unspecified: Secondary | ICD-10-CM

## 2018-02-23 DIAGNOSIS — J209 Acute bronchitis, unspecified: Secondary | ICD-10-CM

## 2018-02-23 DIAGNOSIS — J029 Acute pharyngitis, unspecified: Secondary | ICD-10-CM

## 2018-02-23 LAB — POCT RAPID STREP A (OFFICE): Rapid Strep A Screen: NEGATIVE

## 2018-02-23 MED ORDER — DOXYCYCLINE HYCLATE 100 MG PO TABS: 100.0000 mg | ORAL_TABLET | Freq: Two times a day (BID) | ORAL | 0 refills | Status: AC

## 2018-02-23 MED ORDER — ALBUTEROL SULFATE HFA 108 (90 BASE) MCG/ACT IN AERS: 2.0000 | INHALATION_SPRAY | Freq: Four times a day (QID) | RESPIRATORY_TRACT | 0 refills | Status: DC | PRN

## 2018-02-23 MED ORDER — BENZONATATE 200 MG PO CAPS: 200.0000 mg | ORAL_CAPSULE | Freq: Every evening | ORAL | 0 refills | Status: DC | PRN

## 2018-02-23 NOTE — Progress Notes (Signed)
Subjective: sore throat and cough     Allison Alvarez is a 57 y.o. female who presents for evaluation of sore throat that began yesterday and cough and nasal congestion for 6 weeks.  Patient reports a long-standing history of tobacco use and having a "smoker's cough" (describes as nonproductive) but denies any diagnosis of COPD or asthma.  Reports that for the last month and a half she has had a mild productive cough with yellow/green sputum and nasal congestion, that has worsened in the last week. 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 Treatment to date: Flonase.   Denies rash, nausea, vomiting, diarrhea, SOB, wheezing, chest or back pain, ear pain, difficulty swallowing, confusion, weight loss, night sweats, TB exposure/risk factors, anosmia/hyposmia, dental pain, facial pressure/unilateral facial pain, pain exacerbated by bending over, headache, body aches, fatigue, fever, chills, ocular pruritis/discharge, severe symptoms, or initial improvement and then worsening of symptoms. History of smoking, asthma, COPD: Positive for tobacco use.  Denies any history of asthma or COPD. History of recurrent sinus and/or lung infections: Patient denies. Antibiotic use in the last 3 months: Patient denies.  Patient denies any other symptoms or concerns.  Review of Systems Pertinent items noted in HPI and remainder of comprehensive ROS otherwise negative.     Objective:   Physical Exam General: Awake, alert, and oriented. No acute distress. Well developed, hydrated and nourished. Appears stated age. Nontoxic appearance.  HEENT:  PND noted.  Mild erythema to posterior oropharynx.  No edema or exudates of pharynx or tonsils. No erythema or bulging of TM.  Mild erythema/edema to nasal mucosa. Sinuses nontender. Supple neck without adenopathy. Cardiac: Heart rate and rhythm are normal. No murmurs, gallops, or rubs are auscultated. S1  and S2 are heard and are of normal intensity.  Respiratory: No signs of respiratory distress. Lungs clear. No tachypnea. Able to speak in full sentences without dyspnea. Nonlabored respirations.  Skin: Skin is warm, dry and intact. Appropriate color for ethnicity. No cyanosis noted.   Diagnostic Results: Rapid strep test negative Chest x-ray  EXAM: CHEST - 2 VIEW  COMPARISON: None.  FINDINGS: Lungs are clear. Heart size and pulmonary vascularity are normal. No adenopathy. No bone lesions.  IMPRESSION: No edema or consolidation.   Electronically Signed By: Bretta Bang III M.D. On: 02/23/2018 10:55  Assessment:    bronchitis   Plan:   Discussed x-ray results with the patient. Discussed diagnosis and treatment of bronchitis. Suggested symptomatic OTC remedies. Nasal saline spray for congestion. Prescribed doxycycline.  Patient has taken this in the past and tolerated it well. Prescribed Tessalon Perles to use as needed at night for cough.  Patient reports these have been very helpful in the past. Prescribed as needed albuterol and discussed instructions for use.  Advised her to monitor her blood pressure while taking this and only use if she notices symptomatic improvement with use. Discussed side/adverse effects of medications. Discussed red flag symptoms and circumstances with which to seek medical care.  Advised the patient to follow-up with her primary care provider in 5 to 7 days if symptoms are not improving or sooner if symptoms worsen, she develops new symptoms, or has any concerns.  New Prescriptions   ALBUTEROL (PROVENTIL HFA;VENTOLIN HFA) 108 (90 BASE) MCG/ACT INHALER    Inhale 2 puffs into the lungs every 6 (six) hours as needed for wheezing or shortness of breath (or cough).   BENZONATATE (TESSALON) 200 MG CAPSULE  Take 1 capsule (200 mg total) by mouth at bedtime as needed for cough.   DOXYCYCLINE (VIBRA-TABS) 100 MG TABLET    Take 1 tablet (100 mg  total) by mouth 2 (two) times daily for 7 days.

## 2018-02-24 ENCOUNTER — Encounter: Payer: Self-pay | Admitting: *Deleted

## 2018-02-24 DIAGNOSIS — I10 Essential (primary) hypertension: Secondary | ICD-10-CM | POA: Insufficient documentation

## 2018-05-04 ENCOUNTER — Ambulatory Visit: Payer: Managed Care, Other (non HMO) | Admitting: Podiatry

## 2018-05-04 ENCOUNTER — Encounter: Payer: Self-pay | Admitting: Podiatry

## 2018-05-04 ENCOUNTER — Telehealth: Payer: Self-pay | Admitting: Podiatry

## 2018-05-04 ENCOUNTER — Ambulatory Visit (INDEPENDENT_AMBULATORY_CARE_PROVIDER_SITE_OTHER): Payer: Managed Care, Other (non HMO)

## 2018-05-04 VITALS — BP 132/80 | HR 75 | Resp 16 | Ht 68.0 in | Wt 175.0 lb

## 2018-05-04 DIAGNOSIS — M722 Plantar fascial fibromatosis: Secondary | ICD-10-CM

## 2018-05-04 MED ORDER — MELOXICAM 15 MG PO TABS: 15.0000 mg | ORAL_TABLET | Freq: Every day | ORAL | 3 refills | Status: DC

## 2018-05-04 MED ORDER — METHYLPREDNISOLONE 4 MG PO TABS: ORAL_TABLET | ORAL | 0 refills | Status: DC

## 2018-05-04 NOTE — Telephone Encounter (Signed)
I saw Dr. Logan Bores this morning and he was supposed to send prescriptions to [facility] on [address] in [place]. The pharmacy has not received those yet.

## 2018-05-04 NOTE — Progress Notes (Signed)
   Subjective:    Patient ID: Allison Alvarez, female    DOB: 02-16-1961, 58 y.o.   MRN: [id number]  HPI    Review of Systems  Musculoskeletal: Positive for arthralgias and myalgias.  All other systems reviewed and are negative.      Objective:   Physical Exam        Assessment & Plan:

## 2018-05-04 NOTE — Patient Instructions (Signed)

## 2018-05-15 NOTE — Progress Notes (Signed)
   Subjective: 58 year old female presenting today as a new patient with a chief complaint of pain and soreness to the left plantar heel that began about one month ago. She states it feels as if she is walking on a stone or a bruised area. Walking and standing increases her pain. She has been taking Ibuprofen for treatment. Patient is here for further evaluation and treatment.   Past Medical History:  Diagnosis Date  . Diverticulitis      Objective: Physical Exam General: The patient is alert and oriented x3 in no acute distress.  Dermatology: Skin is warm, dry and supple bilateral lower extremities. Negative for open lesions or macerations bilateral.   Vascular: Dorsalis Pedis and Posterior Tibial pulses palpable bilateral.  Capillary fill time is immediate to all digits.  Neurological: Epicritic and protective threshold intact bilateral.   Musculoskeletal: Tenderness to palpation to the plantar aspect of the left heel along the plantar fascia. All other joints range of motion within normal limits bilateral. Strength 5/5 in all groups bilateral.   Radiographic exam:   Normal osseous mineralization. Joint spaces preserved. No fracture/dislocation/boney destruction. No other soft tissue abnormalities or radiopaque foreign bodies.   Assessment: 1. Plantar fasciitis left foot  Plan of Care:  1. Patient evaluated. Xrays reviewed.   2. Injection of 0.5cc Celestone soluspan injected into the left plantar fascia.  3. Rx for Medrol Dose Pak placed 4. Rx for Meloxicam ordered for patient. 5. Plantar fascial band(s) dispensed  6. Instructed patient regarding therapies and modalities at home to alleviate symptoms.  7. Return to clinic in 4 weeks.     Felecia ShellingBrent M. Archer Vise, DPM [facility]  Dr. Felecia ShellingBrent M. Markell Sciascia, DPM    [address]                                     [place], [place] [address]                Office [phone]  Fax [phone]

## 2018-05-31 ENCOUNTER — Encounter: Payer: Managed Care, Other (non HMO) | Admitting: Podiatry

## 2018-06-08 NOTE — Progress Notes (Signed)
This encounter was created in error - please disregard.

## 2018-07-28 ENCOUNTER — Encounter: Payer: Self-pay | Admitting: Podiatry

## 2018-07-29 ENCOUNTER — Other Ambulatory Visit: Payer: Self-pay

## 2018-07-29 ENCOUNTER — Ambulatory Visit: Payer: Managed Care, Other (non HMO) | Admitting: Podiatry

## 2018-07-29 ENCOUNTER — Encounter: Payer: Self-pay | Admitting: Podiatry

## 2018-07-29 VITALS — Temp 98.5°F

## 2018-07-29 DIAGNOSIS — M722 Plantar fascial fibromatosis: Secondary | ICD-10-CM

## 2018-07-29 MED ORDER — METHYLPREDNISOLONE 4 MG PO TBPK: ORAL_TABLET | ORAL | 0 refills | Status: DC

## 2018-07-29 NOTE — Progress Notes (Signed)
   Subjective: 58 year old female presenting today for follow-up evaluation regarding left heel pain.  Patient states she is doing very well until she stepped on her dog's ball and reaggravated her plantar fascia.  She has been hurting ever since.  Patient states the injection and the Medrol Dosepak she received last visit helped significantly.  She presents for further treatment evaluation   Past Medical History:  Diagnosis Date  . Diverticulitis      Objective: Physical Exam General: The patient is alert and oriented x3 in no acute distress.  Dermatology: Skin is warm, dry and supple bilateral lower extremities. Negative for open lesions or macerations bilateral.   Vascular: Dorsalis Pedis and Posterior Tibial pulses palpable bilateral.  Capillary fill time is immediate to all digits.  Neurological: Epicritic and protective threshold intact bilateral.   Musculoskeletal: Tenderness to palpation to the plantar aspect of the left heel along the plantar fascia. All other joints range of motion within normal limits bilateral. Strength 5/5 in all groups bilateral.    Assessment: 1. Plantar fasciitis left foot  Plan of Care:  1. Patient evaluated.  2. Injection of 0.5cc Celestone soluspan injected into the left plantar fascia.  3. Rx for Medrol Dose Pak placed 4.  Continue meloxicam daily 5.  Continue plantar fascial brace as needed  6.  Recommend OTC insoles online since the sport stores are currently closed due to the COVID-19 pandemic.  Recommend Superfeet or Powersteps brands 7. Return to clinic PRN.     Felecia Shelling, DPM [facility]  Dr. Felecia Shelling, DPM    [address] [place], [place] [address]                Office [phone]  Fax [phone]

## 2018-09-01 ENCOUNTER — Encounter: Payer: Self-pay | Admitting: Podiatry

## 2018-09-01 NOTE — Telephone Encounter (Signed)
Please advise 

## 2018-09-02 ENCOUNTER — Other Ambulatory Visit: Payer: Self-pay

## 2018-09-02 MED ORDER — MELOXICAM 15 MG PO TABS: 15.0000 mg | ORAL_TABLET | Freq: Every day | ORAL | 3 refills | Status: DC

## 2018-09-02 NOTE — Telephone Encounter (Signed)
Pharmacy refill request for Meloxicam 15mg   Per Dr. Evans verbal order, ok to refill.   Script has been sent to pharmacy 

## 2018-09-16 ENCOUNTER — Encounter: Payer: Self-pay | Admitting: Podiatry

## 2018-09-16 ENCOUNTER — Ambulatory Visit (INDEPENDENT_AMBULATORY_CARE_PROVIDER_SITE_OTHER): Payer: Managed Care, Other (non HMO) | Admitting: Podiatry

## 2018-09-16 ENCOUNTER — Other Ambulatory Visit: Payer: Self-pay

## 2018-09-16 VITALS — Temp 96.7°F | Resp 16

## 2018-09-16 DIAGNOSIS — M722 Plantar fascial fibromatosis: Secondary | ICD-10-CM

## 2018-09-19 NOTE — Progress Notes (Signed)
   Subjective: 58 year old female presenting today for follow-up evaluation of plantar fasciitis of the left foot. She states her pain has not changed and the injection she last received provided some relief for about 5 days. She now reports intermittent moderate sharp pain and denies modifying factors. She has been taking Meloxicam and using the inserts as directed. Patient is here for further evaluation and treatment.   Past Medical History:  Diagnosis Date  . Diverticulitis      Objective: Physical Exam General: The patient is alert and oriented x3 in no acute distress.  Dermatology: Skin is warm, dry and supple bilateral lower extremities. Negative for open lesions or macerations bilateral.   Vascular: Dorsalis Pedis and Posterior Tibial pulses palpable bilateral.  Capillary fill time is immediate to all digits.  Neurological: Epicritic and protective threshold intact bilateral.   Musculoskeletal: Tenderness to palpation to the plantar aspect of the left heel along the plantar fascia. All other joints range of motion within normal limits bilateral. Strength 5/5 in all groups bilateral.    Assessment: 1. Plantar fasciitis left foot  Plan of Care:  1. Patient evaluated.  2. Injection of 0.5cc Celestone soluspan injected into the left plantar fascia.  3. Continue taking Meloxicam.  4. Continue using OTC insoles.  5. .Orders for physical therapy 3 times weekly for four weeks placed today.  6. Return to clinic in 4 weeks.      Edrick Kins, DPM [facility]  Dr. Edrick Kins, DPM    [address], [place] [address]                Office [phone]  Fax [phone]

## 2018-09-23 ENCOUNTER — Other Ambulatory Visit: Payer: Self-pay

## 2018-09-23 DIAGNOSIS — M722 Plantar fascial fibromatosis: Secondary | ICD-10-CM

## 2018-09-29 ENCOUNTER — Other Ambulatory Visit: Payer: Self-pay | Admitting: *Deleted

## 2018-09-29 ENCOUNTER — Ambulatory Visit: Payer: Managed Care, Other (non HMO) | Admitting: Adult Health

## 2018-09-29 ENCOUNTER — Other Ambulatory Visit: Payer: Self-pay

## 2018-09-29 ENCOUNTER — Encounter: Payer: Self-pay | Admitting: Adult Health

## 2018-09-29 ENCOUNTER — Encounter (INDEPENDENT_AMBULATORY_CARE_PROVIDER_SITE_OTHER): Payer: Self-pay

## 2018-09-29 ENCOUNTER — Telehealth: Payer: Self-pay | Admitting: General Practice

## 2018-09-29 DIAGNOSIS — Z20828 Contact with and (suspected) exposure to other viral communicable diseases: Secondary | ICD-10-CM | POA: Diagnosis not present

## 2018-09-29 DIAGNOSIS — Z20822 Contact with and (suspected) exposure to covid-19: Secondary | ICD-10-CM

## 2018-09-29 DIAGNOSIS — R6889 Other general symptoms and signs: Secondary | ICD-10-CM | POA: Diagnosis not present

## 2018-09-29 NOTE — Progress Notes (Signed)
Allergies  Allergen Reactions  . Morphine Itching  . Hydromorphone Itching    Virtual Visit via Telephone Note  I connected with Allison Alvarez on 09/29/18 at 10:00 AM EDT by telephone and verified that I am speaking with the correct person using two identifiers.  Location: Patient: at her home  Provider: [facility], [facility], [place]     I discussed the limitations, risks, security and privacy concerns of performing an evaluation and management service by telephone and the availability of in person appointments. I also discussed with the patient that there may be a patient responsible charge related to this service. The patient expressed understanding and agreed to proceed.   History of Present Illness:  (no vital signs available)  Patient is a 58 year old female in no acute distress who calls the clinic for a telephone visit.   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 She did not have a mask.  They stayed outside the entire time. She sat on porch with the covid positive patient, 10 feet away but client got angry and brushed up against patient in anger.  She was hollering and having heavy breathing  at the patient and many other family members. She was then in close to the patient up against her standing at the door. Papers were exchanged, different pen was used.  Patient did not wear  a mask.     Observations/Objective: Patient is alert and oriented and responsive to questions Engages in conversation with provider. Speaks in full sentences without any pauses without any shortness of breath or distress.   She has had diarrhea yesterday 09/28/18 afternoon several times.  She has mild congestion that started this morning 09/29/18, " feels like allergies"  Woke up  with it this morning. She did have a headache.  She is a smoker. Productive clear sputum mostly.   Patient  denies any fever, body aches,chills, rash, chest pain, shortness of breath, nausea, vomiting.     Assessment and Plan:    ICD-10-CM   1. Suspected Covid-19 Virus Infection  R68.89 Temperature monitoring  2. Close Exposure to Covid-19 Virus  Z20.828 Temperature monitoring     Follow Up Instructions: Work note given for remain out of work 10/09/18 or longer if symptoms persist. She has to be fever free 72 hours without fever reducers as well,  Covid test 6/18 and 6/19 to see if patient will meet test strategy criteria to return to work based on symptoms.    Advised patient call the office or your primary care doctor for an appointment if no improvement within 72 hours or if any symptoms change or worsen at any time  Advised ER or urgent Care if after hours or on weekend. Call 911 for emergency symptoms at any time.Patinet verbalized understanding of all instructions given/reviewed and treatment plan and has no further questions or concerns at this time.     I discussed the assessment and treatment plan with the patient. The patient was provided an opportunity to ask questions and all were answered. The patient agreed with the plan and demonstrated an understanding of the instructions.   The patient was advised to call back or seek an in-person evaluation if the symptoms worsen or if the condition  fails to improve as anticipated.  I provided  15 minutes of non-face-to-face time during this encounter.   Marcille Buffy, FNP

## 2018-09-29 NOTE — Telephone Encounter (Signed)
Pt has been scheduled for both days for covid testing per her provider.    Pt was referred by: Doreen Beam, FNP

## 2018-09-29 NOTE — Telephone Encounter (Signed)
-----   Message from Doreen Beam, [place] sent at 09/29/2018 10:20 AM EDT ----- Need patient tested 6/18 and then again on 6/19 greater than 24 hours from 1st test. 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  Thanks, Laverna Peace MSN, AGNP-C, FNP-C

## 2018-09-29 NOTE — Addendum Note (Signed)
Addended by: Dimple Nanas on: 09/29/2018 12:13 PM   Modules accepted: Orders

## 2018-09-29 NOTE — Patient Instructions (Signed)
Diarrhea, Adult Diarrhea is when you pass loose and watery poop (stool) often. Diarrhea can make you feel weak and cause you to lose water in your body (get dehydrated). Losing water in your body can cause you to:  Feel tired and thirsty.  Have a dry mouth.  Go pee (urinate) less often. Diarrhea often lasts 2-3 days. However, it can last longer if it is a sign of something more serious. It is important to treat your diarrhea as told by your doctor. Follow these instructions at home: Eating and drinking     Follow these instructions as told by your doctor:  Take an ORS (oral rehydration solution). This is a drink that helps you replace fluids and minerals your body lost. It is sold at pharmacies and stores.  Drink plenty of fluids, such as: ? Water. ? Ice chips. ? Diluted fruit juice. ? Low-calorie sports drinks. ? Milk, if you want.  Avoid drinking fluids that have a lot of sugar or caffeine in them.  Eat bland, easy-to-digest foods in small amounts as you are able. These foods include: ? Bananas. ? Applesauce. ? Rice. ? Low-fat (lean) meats. ? Toast. ? Crackers.  Avoid alcohol.  Avoid spicy or fatty foods.  Medicines  Take over-the-counter and prescription medicines only as told by your doctor.  If you were prescribed an antibiotic medicine, take it as told by your doctor. Do not stop using the antibiotic even if you start to feel better. General instructions   Wash your hands often using soap and water. If soap and water are not available, use a hand sanitizer. Others in your home should wash their hands as well. Hands should be washed: ? After using the toilet or changing a diaper. ? Before preparing, cooking, or serving food. ? While caring for a sick person. ? While visiting someone in a hospital.  Drink enough fluid to keep your pee (urine) pale yellow.  Rest at home while you get better.  Watch your condition for any changes.  Take a warm bath to help  with any burning or pain from having diarrhea.  Keep all follow-up visits as told by your doctor. This is important. Contact a doctor if:  You have a fever.  Your diarrhea gets worse.  You have new symptoms.  You cannot keep fluids down.  You feel light-headed or dizzy.  You have a headache.  You have muscle cramps. Get help right away if:  You have chest pain.  You feel very weak or you pass out (faint).  You have bloody or black poop or poop that looks like tar.  You have very bad pain, cramping, or bloating in your belly (abdomen).  You have trouble breathing or you are breathing very quickly.  Your heart is beating very quickly.  Your skin feels cold and clammy.  You feel confused.  You have signs of losing too much water in your body, such as: ? Dark pee, very little pee, or no pee. ? Cracked lips. ? Dry mouth. ? Sunken eyes. ? Sleepiness. ? Weakness. Summary  Diarrhea is when you pass loose and watery poop (stool) often.  Diarrhea can make you feel weak and cause you to lose water in your body (get dehydrated).  Take an ORS (oral rehydration solution). This is a drink that is sold at pharmacies and stores.  Eat bland, easy-to-digest foods in small amounts as you are able.  Contact a doctor if your condition gets worse. Get help right   away if you have signs that you have lost too much water in your body. This information is not intended to replace advice given to you by your health care provider. Make sure you discuss any questions you have with your health care provider. Document Released: 09/16/2007 Document Revised: 09/03/2017 Document Reviewed: 09/03/2017 Elsevier Interactive Patient Education  2019 [place]. Upper Respiratory Infection, Adult An upper respiratory infection (URI) affects the nose, throat, and upper air passages. URIs are caused by germs (viruses). The most common type of URI is often called "the common cold." Medicines cannot  cure URIs, but you can do things at home to relieve your symptoms. URIs usually get better within 7-10 days. Follow these instructions at home: Activity  Rest as needed.  If you have a fever, stay home from work or school until your fever is gone, or until your doctor says you may return to work or school. ? You should stay home until you cannot spread the infection anymore (you are not contagious). ? Your doctor may have you wear a face mask so you have less risk of spreading the infection. Relieving symptoms  Gargle with a salt-water mixture 3-4 times a day or as needed. To make a salt-water mixture, completely dissolve -1 tsp of salt in 1 cup of warm water.  Use a cool-mist humidifier to add moisture to the air. This can help you breathe more easily. Eating and drinking   Drink enough fluid to keep your pee (urine) pale yellow.  Eat soups and other clear broths. General instructions   Take over-the-counter and prescription medicines only as told by your doctor. These include cold medicines, fever reducers, and cough suppressants.  Do not use any products that contain nicotine or tobacco. These include cigarettes and e-cigarettes. If you need help quitting, ask your doctor.  Avoid being where people are smoking (avoid secondhand smoke).  Make sure you get regular shots and get the flu shot every year.  Keep all follow-up visits as told by your doctor. This is important. How to avoid spreading infection to others   Wash your hands often with soap and water. If you do not have soap and water, use hand sanitizer.  Avoid touching your mouth, face, eyes, or nose.  Cough or sneeze into a tissue or your sleeve or elbow. Do not cough or sneeze into your hand or into the air. Contact a doctor if:  You are getting worse, not better.  You have any of these: ? A fever. ? Chills. ? Brown or red mucus in your nose. ? Yellow or brown fluid (discharge)coming from your nose. ? Pain  in your face, especially when you bend forward. ? Swollen neck glands. ? Pain with swallowing. ? White areas in the back of your throat. Get help right away if:  You have shortness of breath that gets worse.  You have very bad or constant: ? Headache. ? Ear pain. ? Pain in your forehead, behind your eyes, and over your cheekbones (sinus pain). ? Chest pain.  You have long-lasting (chronic) lung disease along with any of these: ? Wheezing. ? Long-lasting cough. ? Coughing up blood. ? A change in your usual mucus.  You have a stiff neck.  You have changes in your: ? Vision. ? Hearing. ? Thinking. ? Mood. Summary  An upper respiratory infection (URI) is caused by a germ called a virus. The most common type of URI is often called "the common cold."  URIs usually get  better within 7-10 days.  Take over-the-counter and prescription medicines only as told by your doctor. This information is not intended to replace advice given to you by your health care provider. Make sure you discuss any questions you have with your health care provider. Document Released: 09/16/2007 Document Revised: 11/20/2016 Document Reviewed: 11/20/2016 Elsevier Interactive Patient Education  2019 [employer]    Person Under Monitoring Name: Allison Alvarez  [place] [address] [place] [id number]   CORONAVIRUS DISEASE 2019 (COVID-19) Guidance for Persons Under Investigation You are being tested for the virus that causes coronavirus disease 2019 (COVID-19). Public health actions are necessary to ensure protection of your health and the health of others, and to prevent further spread of infection. COVID-19 is caused by a virus that can cause symptoms, such as fever, cough, and shortness of breath. The primary transmission from person to person is by coughing or sneezing. On May 12, 2018, the World Health Organization announced a [employer] Emergency of International Concern and on May 13, 2018 the U.S. Department of Health and Human Services declared a public health emergency. If the virus that causesCOVID-19 spreads in the community, it could have severe public health consequences.  As a person under investigation for COVID-19, the [place] Department of Health and [employer], Division of [employer] advises you to adhere to the following guidance until your test results are reported to you. If your test result is positive, you will receive additional information from your provider and your local health department at that time.   Remain at home until you are cleared by your health provider or public health authorities.   Keep a log of visitors to your home using the form provided. Any visitors to your home must be aware of your isolation status.  If you plan to move to a new address or leave the county, notify the local health department in your county.  Call a doctor or seek care if you have an urgent medical need. Before seeking medical care, call ahead and get instructions from the provider before arriving at the medical office, clinic or hospital. Notify them that you are being tested for the virus that causes COVID-19 so arrangements can be made, as necessary, to prevent transmission to others in the healthcare setting. Next, notify the local health department in your county.  If a medical emergency arises and you need to call 911, inform the first responders that you are being tested for the virus that causes COVID-19. Next, notify the local health department in your county.  Adhere to all guidance set forth by the [facility] of [employer] for [facility] [employer] of patients that is based on guidance from the Center for Disease Control and Prevention with suspected or confirmed COVID-19. It is provided with this guidance for Persons Under Investigation.  Your health and the health of our community are our top priorities. Public Health officials remain  available to provide assistance and counseling to you about COVID-19 and compliance with this guidance.  Provider: ____________________________________________________________ Date: ______/_____/_________  By signing below, you acknowledge that you have read and agree to comply with this Guidance for Persons Under Investigation. ______________________________________________________________ Date: ______/_____/_________  WHO DO I CALL? You can find a list of local health departments here: http://dean.org/https://www.ncdhhs.gov/divisions/public-health/county-healthdepartments Health Department: ____________________________________________________________________ Contact Name: ________________________________________________________________________ Telephone: ___________________________________________________________________________  [place] [facility], Division of Public Health, Communicable Disease Branch COVID-19 Guidance for Persons Under Investigation June 18, 2018

## 2018-09-30 ENCOUNTER — Other Ambulatory Visit: Payer: Self-pay

## 2018-09-30 ENCOUNTER — Telehealth: Payer: Self-pay | Admitting: *Deleted

## 2018-09-30 ENCOUNTER — Encounter: Payer: Self-pay | Admitting: Adult Health

## 2018-09-30 ENCOUNTER — Encounter (INDEPENDENT_AMBULATORY_CARE_PROVIDER_SITE_OTHER): Payer: Self-pay

## 2018-09-30 DIAGNOSIS — Z20822 Contact with and (suspected) exposure to covid-19: Secondary | ICD-10-CM

## 2018-09-30 NOTE — Telephone Encounter (Signed)
Contacted pt due to my chart companion responses of cough and weakness that are worse; pt says that she has fibromyalgia; advised pt to prioritize, and space activities; other recommendations for cough: 6: GENERAL CARE ADVICE FOR COVID-19 SYMPTOMS:  * Cough: Use cough drops.  Sore throat: Try throat lozenges, hard candy or warm chicken broth.    7: COUGH MEDICINES:  * OTC COUGH SYRUPS: The most common cough suppressant in OTC cough medications is dextromethorphan. Often the letters 'DM' appear in the name.  * OTC COUGH DROPS: Cough drops can help a lot, especially for mild coughs. They reduce coughing by soothing your irritated throat and removing that tickle sensation in the back of the throat. Cough drops also have the advantage of portability - you can carry them with you.  * HOME REMEDY - HARD CANDY: Hard candy works just as well as medicine-flavored OTC cough drops. People who have diabetes should use sugar-free candy.  * HOME REMEDY - HONEY: This old home remedy has been shown to help decrease coughing at night. The adult dosage is 2 teaspoons (10 ml) at bedtime. Honey should not be given to infants under one year of age.   8: CAUTION - DEXTROMETHORPHAN:  * Do not try to completely suppress coughs that produce mucus and phlegm. Remember that coughing is helpful in bringing up mucus from the lungs and preventing pneumonia.  * Research Notes: Dextromethorphan in some research studies has been shown to reduce the frequency and severity of cough in adults (18 years or older) without significant adverse effects. However, other studies suggest that dextromethorphan is no better than placebo at reducing a cough.  * Drug Abuse Potential: It should be noted that dextromethorphan has become a drug of abuse. This problem is seen most often in adolescents. Overdose symptoms can range from giggling and euphoria to hallucinations and coma.  * CONTRAINDICATED: Do not take dextromethorphan if you are taking a  monoamine oxidase (MAO) inhibitor now or in the past 2 weeks. Examples of MAO inhibitors include isocarboxazid (Marplan), phenelzine (Nardil), selegiline (Eldepryl, Emsam, Zelapar), and tranylcypromine (Parnate). Do not take dextromethorphan if you are taking venlafaxine (Effexor).  Pt also instructed to contact PCP for further recommendations; she verbalized understanding.

## 2018-10-01 ENCOUNTER — Encounter (INDEPENDENT_AMBULATORY_CARE_PROVIDER_SITE_OTHER): Payer: Self-pay

## 2018-10-01 LAB — NOVEL CORONAVIRUS, NAA: SARS-CoV-2, NAA: NOT DETECTED

## 2018-10-02 ENCOUNTER — Telehealth: Payer: Self-pay

## 2018-10-02 ENCOUNTER — Encounter (INDEPENDENT_AMBULATORY_CARE_PROVIDER_SITE_OTHER): Payer: Self-pay

## 2018-10-02 NOTE — Telephone Encounter (Signed)
On MyChart questionairre, pt recorded that  Her weakness and appetite was worse today. Pt stated that she had a nightmare last night and didn't sleep well and is still in bed. Pt stated that she has not eaten anything yet this morning. Encouraged pt to stay hydrated and rest as much as possible. Pt verbalized understanding.

## 2018-10-03 ENCOUNTER — Encounter (INDEPENDENT_AMBULATORY_CARE_PROVIDER_SITE_OTHER): Payer: Self-pay

## 2018-10-04 ENCOUNTER — Encounter: Payer: Self-pay | Admitting: Adult Health

## 2018-10-04 ENCOUNTER — Encounter (INDEPENDENT_AMBULATORY_CARE_PROVIDER_SITE_OTHER): Payer: Self-pay

## 2018-10-04 ENCOUNTER — Telehealth: Payer: Self-pay | Admitting: *Deleted

## 2018-10-04 LAB — NOVEL CORONAVIRUS, NAA: SARS-CoV-2, NAA: NOT DETECTED

## 2018-10-04 NOTE — Telephone Encounter (Signed)
Thank you I have been in contact with her as well.

## 2018-10-04 NOTE — Telephone Encounter (Signed)
Contacted pt due to my chart companion response of worsening appetite; the pt says that she is not hungry and she is having lower abdominal cramping; she denies diarrhea; the pt says that she feels that this is not related to COVID;pt advised to continue to rest and hydrate; pt also advised to contact her PCP for further recommendations; she verbalized understanding; will route to provider for notification.

## 2018-10-04 NOTE — Telephone Encounter (Signed)
Sent to wrong person in epic

## 2018-10-05 ENCOUNTER — Encounter (INDEPENDENT_AMBULATORY_CARE_PROVIDER_SITE_OTHER): Payer: Self-pay

## 2018-10-05 ENCOUNTER — Encounter: Payer: Self-pay | Admitting: Adult Health

## 2018-10-05 ENCOUNTER — Telehealth: Payer: Self-pay | Admitting: Adult Health

## 2018-10-05 NOTE — Telephone Encounter (Signed)
Routed to me again

## 2018-10-05 NOTE — Telephone Encounter (Signed)
Patient was called for follow-up after receiving my chart messaging from patient.  Called patient 10/05/2018 at 8:30 AM.  Patient reports that she is having mild nausea this morning, her diarrhea and body aches have resolved since yesterday.  She is feeling much better.  She is afebrile without fever reducers. She will remain out of work until 10/10/2018, due to COVID-like symptoms, she has tested negative for COVID twice but waiting on resolution of GI symptoms.  Cannot exclude norovirus or other gastrointestinal virus as well.   She is advised to call the office should any symptoms worsen, and seek after-hours emergency medical attention should the office be closed. Patient verbalizes understanding.  He was also sent a work note to remain out of work until Monday, 10/10/2018, this will give her 55 hours for symptoms to resolve and she does not work the weekend.  Patient understands that if she is running a fever or having symptoms on Monday morning she should not go to work and call the clinic.

## 2018-10-06 ENCOUNTER — Encounter: Payer: Self-pay | Admitting: Adult Health

## 2018-10-07 ENCOUNTER — Encounter (INDEPENDENT_AMBULATORY_CARE_PROVIDER_SITE_OTHER): Payer: Self-pay

## 2018-10-12 ENCOUNTER — Ambulatory Visit: Payer: Managed Care, Other (non HMO) | Attending: Podiatry

## 2018-10-17 ENCOUNTER — Ambulatory Visit: Payer: Managed Care, Other (non HMO)

## 2018-10-19 ENCOUNTER — Ambulatory Visit: Payer: Managed Care, Other (non HMO)

## 2018-10-24 ENCOUNTER — Ambulatory Visit: Payer: Managed Care, Other (non HMO)

## 2018-10-25 ENCOUNTER — Ambulatory Visit: Payer: Managed Care, Other (non HMO) | Admitting: Podiatry

## 2018-12-21 ENCOUNTER — Encounter: Payer: Self-pay | Admitting: Adult Health

## 2018-12-21 ENCOUNTER — Other Ambulatory Visit: Payer: Self-pay

## 2018-12-21 ENCOUNTER — Encounter: Payer: Self-pay | Admitting: Registered Nurse

## 2018-12-21 ENCOUNTER — Ambulatory Visit: Payer: Managed Care, Other (non HMO) | Admitting: Registered Nurse

## 2018-12-21 VITALS — HR 84 | Temp 99.4°F | Resp 16

## 2018-12-21 DIAGNOSIS — T50B95A Adverse effect of other viral vaccines, initial encounter: Secondary | ICD-10-CM | POA: Diagnosis not present

## 2018-12-21 DIAGNOSIS — R509 Fever, unspecified: Secondary | ICD-10-CM

## 2018-12-21 DIAGNOSIS — T50A95A Adverse effect of other bacterial vaccines, initial encounter: Secondary | ICD-10-CM

## 2018-12-21 DIAGNOSIS — J309 Allergic rhinitis, unspecified: Secondary | ICD-10-CM

## 2018-12-21 NOTE — Progress Notes (Signed)
Subjective:    Patient ID: Allison Alvarez, female    DOB: 11-Sep-1960, 58 y.o.   MRN: [id number]  58y/o caucasian female established to clinic new to me had PCM visit and received 4 vaccinations shingrix, pneumococcal 23, hepatitis A and influenza on Monday 7 Sep this week. She also was cleaning out dusty boxes in garage on Monday.  Took a benadryl 25mg  for runny nose/congestion (her usual allergy symptoms)  Later that evening she developed fever and took tylenol.  tmax 102.4 Monday; Tuesday (yesterday) tmax 101.4 and this morning 99.25F  Body aches 5/10 headache 2/10, coughing before she took benadryl, chills with fever.  Injection sites for vaccines sore with mild redness/tenderness.  Feeling better today wondering when she can return to work due to fever and covid 19 pandemic.  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  Working from home today.  Needs work excuse for yesterday.  Denied nausea/vomiting/diarrhea, dyspnea, sore throat, loss of taste/smell, trouble breathing, shortness of breath or exposure to known covid positive persons.  Agreed to telephone visit today  Last dose of tylenol last night.  Tolerating po intake without difficulty.     Review of Systems  Constitutional: Positive for chills, fatigue and fever. Negative for activity change, appetite change and diaphoresis.  HENT: Positive for congestion, rhinorrhea and sneezing. Negative for drooling, ear discharge, ear pain, facial swelling, hearing loss, mouth sores, nosebleeds, trouble swallowing and voice change.   Eyes: Negative for photophobia, pain, discharge, redness, itching and visual disturbance.  Respiratory: Positive for cough. Negative for choking, chest tightness, shortness of breath, wheezing and stridor.   Cardiovascular: Negative for palpitations.  Gastrointestinal: Negative for abdominal pain, blood in stool, diarrhea, nausea and vomiting.  Endocrine: Negative for cold intolerance  and heat intolerance.  Genitourinary: Negative for difficulty urinating.  Musculoskeletal: Positive for myalgias. Negative for arthralgias, back pain, gait problem, joint swelling, neck pain and neck stiffness.  Skin: Negative for color change, pallor, rash and wound.  Allergic/Immunologic: Positive for environmental allergies. Negative for food allergies.  Neurological: Positive for headaches. Negative for dizziness, tremors, seizures, syncope, speech difficulty, weakness, light-headedness and numbness.  Hematological: Negative for adenopathy. Does not bruise/bleed easily.  Psychiatric/Behavioral: Negative for agitation, confusion and sleep disturbance.       Objective:   Physical Exam Vitals signs and nursing note reviewed.  Constitutional:      General: She is awake. She is not in acute distress. HENT:     Right Ear: Hearing normal.     Left Ear: Hearing normal.     Mouth/Throat:     Mouth: No angioedema.     Comments: No throat clearing or nose sniffling noticed during telephone conversation Cardiovascular:     Rate and Rhythm: Normal rate.     Pulses: Normal pulses.  Pulmonary:     Effort: Pulmonary effort is normal.     Breath sounds: Normal breath sounds and air entry. No stridor. No wheezing, rhonchi or rales.     Comments: Spoke full sentences without difficulty; no cough heard during telephone encounter Musculoskeletal:     Right shoulder: Normal.     Left shoulder: Normal.     Right elbow: Normal.    Left elbow: Normal.     Right hip: Normal.     Left hip: Normal.     Right knee: Normal.     Left knee: Normal.     Right hand: Normal.     Left hand:  Normal.     Right lower leg: No edema.     Left lower leg: No edema.  Lymphadenopathy:     Cervical:     Right cervical: No superficial cervical adenopathy.    Left cervical: No superficial cervical adenopathy.  Skin:    General: Skin is warm and dry.     Coloration: Skin is not ashen, cyanotic, jaundiced,  mottled, pale or sallow.     Findings: Erythema present.  Neurological:     General: No focal deficit present.     Mental Status: She is alert.     GCS: GCS verbal subscore is 5.     Motor: Motor function is intact.     Gait: Gait is intact.  Psychiatric:        Attention and Perception: Attention and perception normal.        Mood and Affect: Mood and affect normal.        Speech: Speech normal.        Behavior: Behavior normal. Behavior is cooperative.        Thought Content: Thought content normal.        Cognition and Memory: Cognition and memory normal.        Judgment: Judgment normal.      17 minutes duration of telephone call     Assessment & Plan:  A-fever in adult, adverse reaction to pneumococcal, hepatitis A, influenza and shingrix vaccines initial encounter, acute allergic rhinitis P-Work excuse yesterday and today no direct patient care may work from home if you feel able; discuss with your supervisor.  As discussed on the telephone these symptoms are consistent with known possible symptoms after receiving shingrix, pneumococcal 23, influenza and hepatitis A vaccines along with dust allergies but there is some overlap of symptoms with covid 19 symptoms.  Must be fever free (less than 100.58F) off tylenol/motrin/ibuprofen/advil/aleve/aspirin/naproxen/naprosyn for 24 hours before returning to direct patient care and no new symptoms and your current symptoms improving.  Keep taking your allergy medication especially if working with dusty boxes again or seasonal allergy symptoms worsening due to fall allergens.  Possible symptoms of covid-19: cough, shortness of breath, headache, sore throat, fever, chills, difficulty breathing, fatigue, muscle or body aches, headache, new loss of taste/smell, congestion or runny nose, nausea or vomiting and diarrhea.  If fever returns tomorrow or any of these symptoms start I will put in a request for you to have drive thru covid testing at the  [facility] location.  Testing starts at 0800 and stops at 3:30 M-F.  Please call the [facility] if you experience fever or new symptoms tonight after 0800 tomorrow and do not perform direct patient care tomorrow.  Avoid dehydration.  Hydrate keep urine clear and yellow tinged and urinating every 4-6 hours.  It is easy to become dehydrated with fever/runny nose.   Exitcare handouts on shingrix, influenza, hepatitis a and pneumococcal 23 vaccines VIS.  Reviewed up to date information on her vaccinations and possible side effects/frequency noted in patient populations for fever, myalgias, chills, local injection site reaction, runny nose/congestion, headache  Discussed frequency noted in Up to date for her applicable symptoms  Shingrix possible Adverse Reactions: >10%: Central nervous system: Fatigue (37% to 57%), headache (29% to 51%), shivering (20% to 36%) Gastrointestinal: Gastrointestinal adverse effects (14% to 24%) Local: Pain at injection site (69% to 88%), erythema at injection site (38% to 39%), swelling at injection site (23% to 31%) Neuromuscular & skeletal: Myalgia (35% to 57%) Miscellaneous:  Fever (14% to 28%) 1% to 10%: Central nervous system: Chills (4%), malaise (2%), dizziness (1%) Dermatologic: Injection site pruritus (2%) Gastrointestinal: Nausea (1%) Neuromuscular & skeletal: Arthralgia (2%) <1%, postmarketing, and/or case reports: Gout, high fever, lymphadenitis, optic neuropathy  Influenza possible adverse reactions: >10%: Gastrointestinal: Anorexia (infants, children, and adolescents: 9% to 32%; adults and older adults: 4% to 8%), diarrhea (4% to 13%), nausea (?12%), vomiting (infants, children, and adolescents: ?15%; adults and older adults: ?3%) Local: Erythema at injection site (1% to 37%), induration at injection site (?17%), pain at injection site (17% to 67%), swelling at injection site (?25%), tenderness at injection site (21% to 69%) Nervous system: Drowsiness (infants  and children: 8% to 38%), fatigue (?22%), headache (1% to 27%), irritability (infants and children: 2% to 54%), malaise (?38%), uncontrolled crying (infants and children: 33% to 41%) Neuromuscular & skeletal: Arthralgia (4% to 15%), myalgia (8% to 40%) Respiratory: Cough (infants, children, and adolescents: 1% to 15%), rhinorrhea (infants and children: 1% to 11%), wheezing Miscellaneous: Fever (infants, children, and adolescents: 1% to 16%; adults and older adults: ?4%) 1% to 10%: Dermatologic: Skin rash (infants and children: 1%) Gastrointestinal: Change in appetite (children: 10%) Local: Bruising at injection site (?9%) Nervous system: Chills (?7%), shivering (?9%) Respiratory: Flu-like symptoms (infants and children: 1%), nasal congestion (infants, children, and adolescents: 2% to 6%), nasopharyngitis (infants and children: 2%), oropharyngeal pain (adolescents and adults: 2% to 7%) Frequency not defined: Local: Hematoma at injection site, itching at injection site Pneumococcal 23 possible adverse reactions: Frequency not defined. Cardiovascular: Peripheral edema (in injected extremity) Central nervous system: Chills, Guillain-Barr syndrome, febrile seizures, headache, malaise, pain, paresthesia, radiculopathy Dermatologic: Cellulitis, skin rash, urticaria Gastrointestinal: Nausea, vomiting Hematologic & oncologic: C-reactive protein increased, hemolytic anemia (in patients with other hematologic disorders), leukocytosis, lymphadenitis, lymphadenopathy, thrombocytopenia (in patients with stabilized immune thrombocytopenia [formerly known as idiopathic thrombocytopenic purpura]) Hypersensitivity: Anaphylactoid reaction, angioedema, serum sickness Local: Injection site reaction (most common in clinical trials; includes erythema at injection site, induration at injection site, local soreness/soreness at injection site, swelling at injection site, warm sensation at injection site) Neuromuscular &  skeletal: Arthralgia, arthritis, decreased range of motion (limb), myalgia, weakness Miscellaneous: Fever (most common in clinical trials: ?102F; fever >102F)  Hepatitis A possible adverse reactions: >10%: Central nervous system: Drowsiness, headache, irritability Gastrointestinal: Decreased appetite Local: Erythema at injection site, injection site reaction (soreness, warmth), pain at injection site, swelling at injection site, tenderness at injection site Neuromuscular & skeletal: Weakness Miscellaneous: Fever (?100.45F [1-5 days postvaccination], >98.61F [1-14 days postvaccination]) 1% to 10%: Central nervous system: Chills, fatigue, insomnia, malaise Dermatologic: Skin rash Endocrine & metabolic: Menstrual disease Gastrointestinal: Abdominal pain, anorexia, constipation, diarrhea, gastroenteritis, nausea, vomiting Local: Bruising at injection site, induration at injection site Neuromuscular & skeletal: Arm pain, back pain, myalgia, stiffness Ophthalmic: Conjunctivitis Otic: Otitis media Respiratory: Asthma, cough, nasal congestion, nasopharyngitis, pharyngitis, rhinitis, rhinorrhea, upper respiratory tract infection Miscellaneous: Excessive crying, fever ?102F (1-5 days postvaccination) <1%, postmarketing, and/or case reports: Anaphylaxis, angioedema, arthralgia, ataxia (cerebellar), bronchiolitis, bronchoconstriction, croup, dehydration, dermatitis, dizziness, dysgeusia, dyspnea, encephalitis, erythema multiforme, eye irritation, flu-like symptoms, Guillain-Barre syndrome, hematoma at injection site, hepatitis, hyperhidrosis, hypersensitivity reaction, hypertonia, hypoesthesia, increased creatine kinase, increased serum transaminases (transient), injection site reaction (nodule), insomnia, jaundice, lymphadenopathy, multiple sclerosis, myelitis, neuropathy, otitis, paresthesia, photophobia, pneumonia, pruritus, rash at injection site, respiratory congestion, seizure, serum sickness-like  reaction, syncope, thrombocytopenia, urticaria, vasculitis, vertigo, viral exanthem, wheezing    Patient may use normal saline nasal spray 2 sprays each nostril q2h wa as  needed. flonase 87mcg 1 spray each nostril BID OTC.  Patient denied personal or family history of ENT cancer.  OTC antihistamine of choice benadryl 25-50mg  po q8h caution may cause drowsiness.  Avoid triggers if possible.  Shower prior to bedtime if exposed to triggers.  If allergic dust/dust mites recommend mattress/pillow covers/encasements; washing linens, vacuuming, sweeping, dusting weekly.  Call or return to clinic as needed if these symptoms worsen or fail to improve as anticipated.   Exitcare handout on allergic rhinitis and sinus rinse given to patient.  Patient verbalized understanding of instructions, agreed with plan of care and had no further questions at this time.  P2:  Avoidance and hand washing.

## 2018-12-21 NOTE — Patient Instructions (Addendum)
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    As discussed on the telephone these symptoms are consistent with known possible symptoms after receiving shingrix, pneumococcal 23, influenza and hepatitis A vaccines along with seasonal allergies but there is some overlap of symptoms with covid 19 symptoms.  Must be fever free (less than 100.75F) off tylenol/motrin/ibuprofen/advil/aleve/aspirin/naproxen/naprosyn for 24 hours before returning to direct patient care and no new symptoms and your current symptoms improving.  Possible symptoms of covid-19: cough, shortness of breath, headache, sore throat, fever, chills, difficulty breathing, fatigue, muscle or body aches, headache, new loss of taste/smell, congestion or runny nose, nausea or vomiting and diarrhea.  If fever returns tomorrow or any of these symptoms start I will put in a request for you to have drive thru covid testing at the [facility] location.  Testing starts at 0800 and stops at 3:30.  Please call the [facility] clinic if you experience fever or new symptoms tonight after 0800 tomorrow and do not perform direct patient care tomorrow.  Hydrate keep urine clear and yellow tinged and urinating every 4-6 hours.  It is easy to become dehydrated with fever/runny nose.   Hepatitis A Vaccine: What You Need to Know 1. Why get vaccinated? Hepatitis A is a serious liver disease. It is caused by the hepatitis A virus (HAV). HAV is spread from person to person through contact with the feces (stool) of people who are infected, which can easily happen if someone does not wash his or her hands properly. You can also get hepatitis A from food, water, or objects contaminated with HAV. Symptoms of hepatitis A can include:  fever, fatigue, loss of appetite, nausea, vomiting, and/or joint pain  severe stomach pains and diarrhea (mainly in children), or  jaundice (yellow skin or eyes, dark  urine, clay-colored bowel movements). These symptoms usually appear 2 to 6 weeks after exposure and usually last less than 2 months, although some people can be ill for as long as 6 months. If you have hepatitis A you may be too ill to work. Children often do not have symptoms, but most adults do. You can spread HAV without having symptoms. Hepatitis A can cause liver failure and death, although this is rare and occurs more commonly in persons 77 years of age or older and persons with other liver diseases, such as hepatitis B or C. Hepatitis A vaccine can prevent hepatitis A. Hepatitis A vaccines were recommended in the [place] beginning in 1996. Since then, the number of cases reported each year in the [place] has dropped from around 31,000 cases to fewer than 1,500 cases. 2. Hepatitis A vaccine Hepatitis A vaccine is an inactivated (killed) vaccine. You will need 2 doses for long-lasting protection. These doses should be given at least 6 months apart. Children are routinely vaccinated between their first and second birthdays (27 through 37 months of age). Older children and adolescents can get the vaccine after 23 months. Adults who have not been vaccinated previously and want to be protected against hepatitis A can also get the vaccine. You should get hepatitis A vaccine if you:  are traveling to countries where hepatitis A is common,  are a man who has sex with other men,  use illegal drugs,  have a chronic liver disease such as hepatitis B or hepatitis C,  are being treated with clotting-factor concentrates,  work with hepatitis A-infected animals or in a  hepatitis A research laboratory, or  expect to have close personal contact with an international adoptee from a country where hepatitis A is common Ask your healthcare provider if you want more information about any of these groups. There are no known risks to getting hepatitis A vaccine at the same time as other vaccines. 3. Some  people should not get this vaccine Tell the person who is giving you the vaccine:  If you have any severe, life-threatening allergies. If you ever had a life-threatening allergic reaction after a dose of hepatitis A vaccine, or have a severe allergy to any part of this vaccine, you may be advised not to get vaccinated. Ask your health care provider if you want information about vaccine components.  If you are not feeling well. If you have a mild illness, such as a cold, you can probably get the vaccine today. If you are moderately or severely ill, you should probably wait until you recover. Your doctor can advise you. 4. Risks of a vaccine reaction With any medicine, including vaccines, there is a chance of side effects. These are usually mild and go away on their own, but serious reactions are also possible. Most people who get hepatitis A vaccine do not have any problems with it. Minor problems following hepatitis A vaccine include:  soreness or redness where the shot was given  low-grade fever  headache  tiredness If these problems occur, they usually begin soon after the shot and last 1 or 2 days. Your doctor can tell you more about these reactions. Other problems that could happen after this vaccine:  People sometimes faint after a medical procedure, including vaccination. Sitting or lying down for about 15 minutes can help prevent fainting, and injuries caused by a fall. Tell your provider if you feel dizzy, or have vision changes or ringing in the ears.  Some people get shoulder pain that can be more severe and longer lasting than the more routine soreness that can follow injections. This happens very rarely.  Any medication can cause a severe allergic reaction. Such reactions from a vaccine are very rare, estimated at about 1 in a million doses, and would happen within a few minutes to a few hours after the vaccination. As with any medicine, there is a very remote chance of a  vaccine causing a serious injury or death. The safety of vaccines is always being monitored. For more information, visit: http://floyd.org/www.cdc.gov/vaccinesafety/ 5. What if there is a serious problem? What should I look for?  Look for anything that concerns you, such as signs of a severe allergic reaction, very high fever, or unusual behavior. Signs of a severe allergic reaction can include hives, swelling of the face and throat, difficulty breathing, a fast heartbeat, dizziness, and weakness. These would start a few minutes to a few hours after the vaccination. What should I do?  If you think it is a severe allergic reaction or other emergency that can't wait, call 9-1-1 or get to the nearest hospital. Otherwise, call your clinic. Afterward, the reaction should be reported to the Vaccine Adverse Event Reporting System (VAERS). Your doctor should file this report, or you can do it yourself through the VAERS web site at www.vaers.LAgents.nohhs.gov, or by calling 1-959-824-2740. VAERS does not give medical advice. 6. The National Vaccine Injury Compensation Program The [employer] Vaccine Injury Compensation Program (VICP) is a federal program that was created to compensate people who may have been injured by certain vaccines. Persons who believe they may  have been injured by a vaccine can learn about the program and about filing a claim by calling 903 838 9764 or visiting the [place] website at GoldCloset.com.ee. There is a time limit to file a claim for compensation. 7. How can I learn more?  Ask your healthcare provider. He or she can give you the vaccine package insert or suggest other sources of information.  Call your local or state health department.  Contact the Centers for Disease Control and Prevention (CDC): ? Call 763-336-4647 (1-800-CDC-INFO) or ? Visit CDC's website at http://hunter.com/ CDC Vaccine Information Statement Hepatitis A Vaccine (10/31/2014) This information is not intended to  replace advice given to you by your health care provider. Make sure you discuss any questions you have with your health care provider. Document Released: 01/22/2006 Document Revised: 07/19/2018 Document Reviewed: 11/09/2017 Elsevier Patient Education  [place]. Influenza (Flu) Vaccine (Inactivated or Recombinant): What You Need to Know 1. Why get vaccinated? Influenza vaccine can prevent influenza (flu). Flu is a contagious disease that spreads around the [place] every year, usually between October and May. Anyone can get the flu, but it is more dangerous for some people. Infants and young children, people 58 years of age and older, pregnant women, and people with certain health conditions or a weakened immune system are at greatest risk of flu complications. Pneumonia, bronchitis, sinus infections and ear infections are examples of flu-related complications. If you have a medical condition, such as heart disease, cancer or diabetes, flu can make it worse. Flu can cause fever and chills, sore throat, muscle aches, fatigue, cough, headache, and runny or stuffy nose. Some people may have vomiting and diarrhea, though this is more common in children than adults. Each year thousands of people in the [place] die from flu, and many more are hospitalized. Flu vaccine prevents millions of illnesses and flu-related visits to the doctor each year. 2. Influenza vaccine CDC recommends everyone 64 months of age and older get vaccinated every flu season. Children 6 months through 9 years of age may need 2 doses during a single flu season. Everyone else needs only 1 dose each flu season. It takes about 2 weeks for protection to develop after vaccination. There are many flu viruses, and they are always changing. Each year a new flu vaccine is made to protect against three or four viruses that are likely to cause disease in the upcoming flu season. Even when the vaccine doesn't exactly match these  viruses, it may still provide some protection. Influenza vaccine does not cause flu. Influenza vaccine may be given at the same time as other vaccines. 3. Talk with your health care provider Tell your vaccine provider if the person getting the vaccine:  Has had an allergic reaction after a previous dose of influenza vaccine, or has any severe, life-threatening allergies.  Has ever had Guillain-Barr Syndrome (also called GBS). In some cases, your health care provider may decide to postpone influenza vaccination to a future visit. People with minor illnesses, such as a cold, may be vaccinated. People who are moderately or severely ill should usually wait until they recover before getting influenza vaccine. Your health care provider can give you more information. 4. Risks of a vaccine reaction  Soreness, redness, and swelling where shot is given, fever, muscle aches, and headache can happen after influenza vaccine.  There may be a very small increased risk of Guillain-Barr Syndrome (GBS) after inactivated influenza vaccine (the flu shot). Young children who get the flu shot  along with pneumococcal vaccine (PCV13), and/or DTaP vaccine at the same time might be slightly more likely to have a seizure caused by fever. Tell your health care provider if a child who is getting flu vaccine has ever had a seizure. People sometimes faint after medical procedures, including vaccination. Tell your provider if you feel dizzy or have vision changes or ringing in the ears. As with any medicine, there is a very remote chance of a vaccine causing a severe allergic reaction, other serious injury, or death. 5. What if there is a serious problem? An allergic reaction could occur after the vaccinated person leaves the clinic. If you see signs of a severe allergic reaction (hives, swelling of the face and throat, difficulty breathing, a fast heartbeat, dizziness, or weakness), call 9-1-1 and get the person to the  nearest hospital. For other signs that concern you, call your health care provider. Adverse reactions should be reported to the Vaccine Adverse Event Reporting System (VAERS). Your health care provider will usually file this report, or you can do it yourself. Visit the VAERS website at www.vaers.LAgents.nohhs.gov or call (662)649-19871-(949)130-8808.VAERS is only for reporting reactions, and VAERS staff do not give medical advice. 6. The National Vaccine Injury Compensation Program The [employer] Vaccine Injury Compensation Program (VICP) is a federal program that was created to compensate people who may have been injured by certain vaccines. Visit the VICP website at SpiritualWord.atwww.hrsa.gov/vaccinecompensation or call 208-613-27291-7747213425 to learn about the program and about filing a claim. There is a time limit to file a claim for compensation. 7. How can I learn more?  Ask your healthcare provider.  Call your local or state health department.  Contact the Centers for Disease Control and Prevention (CDC): ? Call 430-871-61891-385-080-5905 (1-800-CDC-INFO) or ? Visit CDC's BiotechRoom.com.cywww.cdc.gov/flu Vaccine Information Statement (Interim) Inactivated Influenza Vaccine (11/25/2017) This information is not intended to replace advice given to you by your health care provider. Make sure you discuss any questions you have with your health care provider. Document Released: 01/22/2006 Document Revised: 07/19/2018 Document Reviewed: 11/29/2017 Elsevier Patient Education  2020 Elsevier Inc. Zoster Vaccine, Recombinant injection What is this medicine? ZOSTER VACCINE (ZOS ter vak SEEN) is used to prevent shingles in adults 56 years old and over. This vaccine is not used to treat shingles or nerve pain from shingles. This medicine may be used for other purposes; ask your health care provider or pharmacist if you have questions. COMMON BRAND NAME(S): [facility] What should I tell my health care provider before I take this medicine? They need to know if you have any of  these conditions:  blood disorders or disease  cancer like leukemia or lymphoma  immune system problems or therapy  an unusual or allergic reaction to vaccines, other medications, foods, dyes, or preservatives  pregnant or trying to get pregnant  breast-feeding How should I use this medicine? This vaccine is for injection in a muscle. It is given by a health care professional. Talk to your pediatrician regarding the use of this medicine in children. This medicine is not approved for use in children. Overdosage: If you think you have taken too much of this medicine contact a poison control center or emergency room at once. NOTE: This medicine is only for you. Do not share this medicine with others. What if I miss a dose? Keep appointments for follow-up (booster) doses as directed. It is important not to miss your dose. Call your doctor or health care professional if you are unable to keep an  appointment. What may interact with this medicine?  medicines that suppress your immune system  medicines to treat cancer  steroid medicines like prednisone or cortisone This list may not describe all possible interactions. Give your health care provider a list of all the medicines, herbs, non-prescription drugs, or dietary supplements you use. Also tell them if you smoke, drink alcohol, or use illegal drugs. Some items may interact with your medicine. What should I watch for while using this medicine? Visit your doctor for regular check ups. This vaccine, like all vaccines, may not fully protect everyone. What side effects may I notice from receiving this medicine? Side effects that you should report to your doctor or health care professional as soon as possible:  allergic reactions like skin rash, itching or hives, swelling of the face, lips, or tongue  breathing problems Side effects that usually do not require medical attention (report these to your doctor or health care professional if  they continue or are bothersome):  chills  headache  fever  nausea, vomiting  redness, warmth, pain, swelling or itching at site where injected  tiredness This list may not describe all possible side effects. Call your doctor for medical advice about side effects. You may report side effects to FDA at 1-800-FDA-1088. Where should I keep my medicine? COVID-19 COVID-19 is a respiratory infection that is caused by a virus called severe acute respiratory syndrome coronavirus 2 (SARS-CoV-2). The disease is also known as coronavirus disease or novel coronavirus. In some people, the virus may not cause any symptoms. In others, it may cause a serious infection. The infection can get worse quickly and can lead to complications, such as:  Pneumonia, or infection of the lungs.  Acute respiratory distress syndrome or ARDS. This is fluid build-up in the lungs.  Acute respiratory failure. This is a condition in which there is not enough oxygen passing from the lungs to the body.  Sepsis or septic shock. This is a serious bodily reaction to an infection.  Blood clotting problems.  Secondary infections due to bacteria or fungus. The virus that causes COVID-19 is contagious. This means that it can spread from person to person through droplets from coughs and sneezes (respiratory secretions). What are the causes? This illness is caused by a virus. You may catch the virus by:  Breathing in droplets from an infected person's cough or sneeze.  Touching something, like a table or a doorknob, that was exposed to the virus (contaminated) and then touching your mouth, nose, or eyes. What increases the risk? Risk for infection You are more likely to be infected with this virus if you:  Live in or travel to an area with a COVID-19 outbreak.  Come in contact with a sick person who recently traveled to an area with a COVID-19 outbreak.  Provide care for or live with a person who is infected with  COVID-19. Risk for serious illness You are more likely to become seriously ill from the virus if you:  Are 70 years of age or older.  Have a long-term disease that lowers your body's ability to fight infection (immunocompromised).  Live in a nursing home or long-term care facility.  Have a long-term (chronic) disease such as: ? Chronic lung disease, including chronic obstructive pulmonary disease or asthma ? Heart disease. ? Diabetes. ? Chronic kidney disease. ? Liver disease.  Are obese. What are the signs or symptoms? Symptoms of this condition can range from mild to severe. Symptoms may appear any time  from 2 to 14 days after being exposed to the virus. They include:  A fever.  A cough.  Difficulty breathing.  Chills.  Muscle pains.  A sore throat.  Loss of taste or smell. Some people may also have stomach problems, such as nausea, vomiting, or diarrhea. Other people may not have any symptoms of COVID-19. How is this diagnosed? This condition may be diagnosed based on:  Your signs and symptoms, especially if: ? You live in an area with a COVID-19 outbreak. ? You recently traveled to or from an area where the virus is common. ? You provide care for or live with a person who was diagnosed with COVID-19.  A physical exam.  Lab tests, which may include: ? A nasal swab to take a sample of fluid from your nose. ? A throat swab to take a sample of fluid from your throat. ? A sample of mucus from your lungs (sputum). ? Blood tests.  Imaging tests, which may include, X-rays, CT scan, or ultrasound. How is this treated? At present, there is no medicine to treat COVID-19. Medicines that treat other diseases are being used on a trial basis to see if they are effective against COVID-19. Your health care provider will talk with you about ways to treat your symptoms. For most people, the infection is mild and can be managed at home with rest, fluids, and over-the-counter  medicines. Treatment for a serious infection usually takes places in a hospital intensive care unit (ICU). It may include one or more of the following treatments. These treatments are given until your symptoms improve.  Receiving fluids and medicines through an IV.  Supplemental oxygen. Extra oxygen is given through a tube in the nose, a face mask, or a hood.  Positioning you to lie on your stomach (prone position). This makes it easier for oxygen to get into the lungs.  Continuous positive airway pressure (CPAP) or bi-level positive airway pressure (BPAP) machine. This treatment uses mild air pressure to keep the airways open. A tube that is connected to a motor delivers oxygen to the body.  Ventilator. This treatment moves air into and out of the lungs by using a tube that is placed in your windpipe.  Tracheostomy. This is a procedure to create a hole in the neck so that a breathing tube can be inserted.  Extracorporeal membrane oxygenation (ECMO). This procedure gives the lungs a chance to recover by taking over the functions of the heart and lungs. It supplies oxygen to the body and removes carbon dioxide. Follow these instructions at home: Lifestyle  If you are sick, stay home except to get medical care. Your health care provider will tell you how long to stay home. Call your health care provider before you go for medical care.  Rest at home as told by your health care provider.  Do not use any products that contain nicotine or tobacco, such as cigarettes, e-cigarettes, and chewing tobacco. If you need help quitting, ask your health care provider.  Return to your normal activities as told by your health care provider. Ask your health care provider what activities are safe for you. General instructions  Take over-the-counter and prescription medicines only as told by your health care provider.  Drink enough fluid to keep your urine pale yellow.  Keep all follow-up visits as told  by your health care provider. This is important. How is this prevented?  There is no vaccine to help prevent COVID-19 infection. However, there are  steps you can take to protect yourself and others from this virus. To protect yourself:   Do not travel to areas where COVID-19 is a risk. The areas where COVID-19 is reported change often. To identify high-risk areas and travel restrictions, check the CDC travel website: StageSync.si  If you live in, or must travel to, an area where COVID-19 is a risk, take precautions to avoid infection. ? Stay away from people who are sick. ? Wash your hands often with soap and water for 20 seconds. If soap and water are not available, use an alcohol-based hand sanitizer. ? Avoid touching your mouth, face, eyes, or nose. ? Avoid going out in public, follow guidance from your state and local health authorities. ? If you must go out in public, wear a cloth face covering or face mask. ? Disinfect objects and surfaces that are frequently touched every day. This may include:  Counters and tables.  Doorknobs and light switches.  Sinks and faucets.  Electronics, such as phones, remote controls, keyboards, computers, and tablets. To protect others: If you have symptoms of COVID-19, take steps to prevent the virus from spreading to others.  If you think you have a COVID-19 infection, contact your health care provider right away. Tell your health care team that you think you may have a COVID-19 infection.  Stay home. Leave your house only to seek medical care. Do not use public transport.  Do not travel while you are sick.  Wash your hands often with soap and water for 20 seconds. If soap and water are not available, use alcohol-based hand sanitizer.  Stay away from other members of your household. Let healthy household members care for children and pets, if possible. If you have to care for children or pets, wash your hands often and wear a  mask. If possible, stay in your own room, separate from others. Use a different bathroom.  Make sure that all people in your household wash their hands well and often.  Cough or sneeze into a tissue or your sleeve or elbow. Do not cough or sneeze into your hand or into the air.  Wear a cloth face covering or face mask. Where to find more information  Centers for Disease Control and Prevention: StickerEmporium.tn  World Health Organization: https://thompson-craig.com/ Contact a health care provider if:  You live in or have traveled to an area where COVID-19 is a risk and you have symptoms of the infection.  You have had contact with someone who has COVID-19 and you have symptoms of the infection. Get help right away if:  You have trouble breathing.  You have pain or pressure in your chest.  You have confusion.  You have bluish lips and fingernails.  You have difficulty waking from sleep.  You have symptoms that get worse. These symptoms may represent a serious problem that is an emergency. Do not wait to see if the symptoms will go away. Get medical help right away. Call your local emergency services (911 in the [place] Do not drive yourself to the hospital. Let the emergency medical personnel know if you think you have COVID-19. Summary  COVID-19 is a respiratory infection that is caused by a virus. It is also known as coronavirus disease or novel coronavirus. It can cause serious infections, such as pneumonia, acute respiratory distress syndrome, acute respiratory failure, or sepsis.  The virus that causes COVID-19 is contagious. This means that it can spread from person to person through droplets from coughs and  sneezes.  You are more likely to develop a serious illness if you are 38 years of age or older, have a weak immunity, live in a nursing home, or have chronic disease.  There is no medicine to treat COVID-19. Your health care provider  will talk with you about ways to treat your symptoms.  Take steps to protect yourself and others from infection. Wash your hands often and disinfect objects and surfaces that are frequently touched every day. Stay away from people who are sick and wear a mask if you are sick. This information is not intended to replace advice given to you by your health care provider. Make sure you discuss any questions you have with your health care provider. Document Released: 05/05/2018 Document Revised: 08/25/2018 Document Reviewed: 05/05/2018 Elsevier Patient Education  2020 Elsevier Inc. Pneumococcal Polysaccharide Vaccine (PPSV23): What You Need to Know 1. Why get vaccinated? Pneumococcal polysaccharide vaccine (PPSV23) can prevent pneumococcal disease. Pneumococcal disease refers to any illness caused by pneumococcal bacteria. These bacteria can cause many types of illnesses, including pneumonia, which is an infection of the lungs. Pneumococcal bacteria are one of the most common causes of pneumonia. Besides pneumonia, pneumococcal bacteria can also cause:  Ear infections  Sinus infections  Meningitis (infection of the tissue covering the brain and spinal cord)  Bacteremia (bloodstream infection) Anyone can get pneumococcal disease, but children under 34 years of age, people with certain medical conditions, adults 65 years or older, and cigarette smokers are at the highest risk. Most pneumococcal infections are mild. However, some can result in long-term problems, such as brain damage or hearing loss. Meningitis, bacteremia, and pneumonia caused by pneumococcal disease can be fatal. 2. PPSV23 PPSV23 protects against 23 types of bacteria that cause pneumococcal disease. PPSV23 is recommended for:  All adults 65 years or older,  Anyone 2 years or older with certain medical conditions that can lead to an increased risk for pneumococcal disease. Most people need only one dose of PPSV23. A second dose  of PPSV23, and another type of pneumococcal vaccine called PCV13, are recommended for certain high-risk groups. Your health care provider can give you more information. People 65 years or older should get a dose of PPSV23 even if they have already gotten one or more doses of the vaccine before they turned 66. 3. Talk with your health care provider Tell your vaccine provider if the person getting the vaccine:  Has had an allergic reaction after a previous dose of PPSV23, or has any severe, life-threatening allergies. In some cases, your health care provider may decide to postpone PPSV23 vaccination to a future visit. People with minor illnesses, such as a cold, may be vaccinated. People who are moderately or severely ill should usually wait until they recover before getting PPSV23. Your health care provider can give you more information. 4. Risks of a vaccine reaction  Redness or pain where the shot is given, feeling tired, fever, or muscle aches can happen after PPSV23. People sometimes faint after medical procedures, including vaccination. Tell your provider if you feel dizzy or have vision changes or ringing in the ears. As with any medicine, there is a very remote chance of a vaccine causing a severe allergic reaction, other serious injury, or death. 5. What if there is a serious problem? An allergic reaction could occur after the vaccinated person leaves the clinic. If you see signs of a severe allergic reaction (hives, swelling of the face and throat, difficulty breathing, a fast heartbeat, dizziness,  or weakness), call 9-1-1 and get the person to the nearest hospital. For other signs that concern you, call your health care provider. Adverse reactions should be reported to the Vaccine Adverse Event Reporting System (VAERS). Your health care provider will usually file this report, or you can do it yourself. Visit the VAERS website at www.vaers.LAgents.no or call (828)351-5810. VAERS is only for  reporting reactions, and VAERS staff do not give medical advice. 6. How can I learn more?  Ask your health care provider.  Call your local or state health department.  Contact the Centers for Disease Control and Prevention (CDC): ? Call 803-088-5903 (1-800-CDC-INFO) or ? Visit CDC's website at PicCapture.uy CDC Vaccine Information Statement PPSV23 Vaccine (02/09/2018) This information is not intended to replace advice given to you by your health care provider. Make sure you discuss any questions you have with your health care provider. Document Released: 01/25/2006 Document Revised: 07/19/2018 Document Reviewed: 11/09/2017 Elsevier Patient Education  2020 [employer]. This vaccine is only given in a clinic, pharmacy, doctor's office, or other health care setting and will not be stored at home. NOTE: This sheet is a summary. It may not cover all possible information. If you have questions about this medicine, talk to your doctor, pharmacist, or health care provider.  2020 Elsevier/Gold Standard (2016-11-09 13:20:30)  How to Perform a Sinus Rinse A sinus rinse is a home treatment that is used to rinse your sinuses with a sterile mixture of salt and water (saline solution). Sinuses are air-filled spaces in your skull behind the bones of your face and forehead that open into your nasal cavity. A sinus rinse can help to clear mucus, dirt, dust, or pollen from your nasal cavity. You may do a sinus rinse when you have a cold, a virus, nasal allergy symptoms, a sinus infection, or stuffiness in your nose or sinuses. Talk with your health care provider about whether a sinus rinse might help you. What are the risks? A sinus rinse is generally safe and effective. However, there are a few risks, which include:  A burning sensation in your sinuses. This may happen if you do not make the saline solution as directed. Be sure to follow all directions when making the saline solution.  Nasal  irritation.  Infection from contaminated water. This is rare, but possible. Do not do a sinus rinse if you have had ear or nasal surgery, ear infection, or blocked ears. Supplies needed:  Saline solution or powder.  Distilled or sterile water may be needed to mix with saline powder. ? You may use boiled and cooled tap water. Boil tap water for 5 minutes; cool until it is lukewarm. Use within 24 hours. ? Do not use regular tap water to mix with the saline solution.  Neti pot or nasal rinse bottle. These supplies release the saline solution into your nose and through your sinuses. Neti pots and nasal rinse bottles can be purchased at [employer], a health food store, or online. How to perform a sinus rinse  1. Wash your hands with soap and water. 2. Wash your device according to the directions that came with the product and then dry it. 3. Use the solution that comes with your product or one that is sold separately in stores. Follow the mixing directions on the package if you need to mix with sterile or distilled water. 4. Fill the device with the amount of saline solution noted in the device instructions. 5. Stand over a sink  and tilt your head sideways over the sink. 6. Place the spout of the device in your upper nostril (the one closer to the ceiling). 7. Gently pour or squeeze the saline solution into your nasal cavity. The liquid should drain out from the lower nostril if you are not too congested. 8. While rinsing, breathe through your open mouth. 9. Gently blow your nose to clear any mucus and rinse solution. Blowing too hard may cause ear pain. 10. Repeat in your other nostril. 11. Clean and rinse your device with clean water and then air-dry it. Talk with your health care provider or pharmacist if you have questions about how to do a sinus rinse. Summary  A sinus rinse is a home treatment that is used to rinse your sinuses with a sterile mixture of salt and water (saline  solution).  A sinus rinse is generally safe and effective. Follow all instructions carefully.  Before doing a sinus rinse, talk with your health care provider about whether it would be helpful for you. This information is not intended to replace advice given to you by your health care provider. Make sure you discuss any questions you have with your health care provider. Document Released: 10/25/2013 Document Revised: 01/25/2017 Document Reviewed: 01/25/2017 Elsevier Patient Education  2020 [employer]. Allergic Rhinitis, Adult Allergic rhinitis is an allergic reaction that affects the mucous membrane inside the nose. It causes sneezing, a runny or stuffy nose, and the feeling of mucus going down the back of the throat (postnasal drip). Allergic rhinitis can be mild to severe. There are two types of allergic rhinitis:  Seasonal. This type is also called hay fever. It happens only during certain seasons.  Perennial. This type can happen at any time of the year. What are the causes? This condition happens when the body's defense system (immune system) responds to certain harmless substances called allergens as though they were germs.  Seasonal allergic rhinitis is triggered by pollen, which can come from grasses, trees, and weeds. Perennial allergic rhinitis may be caused by:  House dust mites.  Pet dander.  Mold spores. What are the signs or symptoms? Symptoms of this condition include:  Sneezing.  Runny or stuffy nose (nasal congestion).  Postnasal drip.  Itchy nose.  Tearing of the eyes.  Trouble sleeping.  Daytime sleepiness. How is this diagnosed? This condition may be diagnosed based on:  Your medical history.  A physical exam.  Tests to check for related conditions, such as: ? Asthma. ? Pink eye. ? Ear infection. ? Upper respiratory infection.  Tests to find out which allergens trigger your symptoms. These may include skin or blood tests. How is this  treated? There is no cure for this condition, but treatment can help control symptoms. Treatment may include:  Taking medicines that block allergy symptoms, such as antihistamines. Medicine may be given as a shot, nasal spray, or pill.  Avoiding the allergen.  Desensitization. This treatment involves getting ongoing shots until your body becomes less sensitive to the allergen. This treatment may be done if other treatments do not help.  If taking medicine and avoiding the allergen does not work, new, stronger medicines may be prescribed. Follow these instructions at home:  Find out what you are allergic to. Common allergens include smoke, dust, and pollen.  Avoid the things you are allergic to. These are some things you can do to help avoid allergens: ? Replace carpet with wood, tile, or vinyl flooring. Carpet can trap dander and dust. ?  Do not smoke. Do not allow smoking in your home. ? Change your heating and air conditioning filter at least once a month. ? During allergy season:  Keep windows closed as much as possible.  Plan outdoor activities when pollen counts are lowest. This is usually during the evening hours.  When coming indoors, change clothing and shower before sitting on furniture or bedding.  Take over-the-counter and prescription medicines only as told by your health care provider.  Keep all follow-up visits as told by your health care provider. This is important. Contact a health care provider if:  You have a fever.  You develop a persistent cough.  You make whistling sounds when you breathe (you wheeze).  Your symptoms interfere with your normal daily activities. Get help right away if:  You have shortness of breath. Summary  This condition can be managed by taking medicines as directed and avoiding allergens.  Contact your health care provider if you develop a persistent cough or fever.  During allergy season, keep windows closed as much as possible.  This information is not intended to replace advice given to you by your health care provider. Make sure you discuss any questions you have with your health care provider. Document Released: 12/23/2000 Document Revised: 03/12/2017 Document Reviewed: 05/07/2016 Elsevier Patient Education  2020 [employer].

## 2018-12-21 NOTE — Telephone Encounter (Signed)
Patient did telephone visit with me today.  See Epic note.  Work excuse for yesterday and today.  May return to work tomorrow if fever free off tylenol/motrin/advil/aleve/ibuprofen/naproxen/naprosyn/aspirin x 24 hours and no new symptoms.  Patient verbalized understanding information/instructions and agreed with plan of care

## 2019-01-05 ENCOUNTER — Encounter: Payer: Self-pay | Admitting: Podiatry

## 2019-01-10 ENCOUNTER — Encounter: Payer: Self-pay | Admitting: Podiatry

## 2019-01-10 ENCOUNTER — Other Ambulatory Visit: Payer: Self-pay

## 2019-01-10 ENCOUNTER — Ambulatory Visit: Payer: Managed Care, Other (non HMO) | Admitting: Podiatry

## 2019-01-10 ENCOUNTER — Telehealth: Payer: Self-pay | Admitting: *Deleted

## 2019-01-10 ENCOUNTER — Other Ambulatory Visit: Payer: Self-pay | Admitting: Podiatry

## 2019-01-10 DIAGNOSIS — M21862 Other specified acquired deformities of left lower leg: Secondary | ICD-10-CM

## 2019-01-10 DIAGNOSIS — M722 Plantar fascial fibromatosis: Secondary | ICD-10-CM | POA: Diagnosis not present

## 2019-01-10 DIAGNOSIS — M216X2 Other acquired deformities of left foot: Secondary | ICD-10-CM

## 2019-01-10 NOTE — Telephone Encounter (Signed)
"  I saw Dr. Amalia Hailey this morning.  I need to schedule my surgery.  He told me to wait and call tomorrow but I need to find out a date as soon as possible because my daughter is getting married in December and we plan on having her a shower in November.  Will you be able to help me with this or do I need to call back tomorrow?"  I don't have your information at this time.  You will need to call back tomorrow.  "Is there a particular time I should call?"  You can call anytime, there's not a specific time.  (Please send paperwork.)

## 2019-01-10 NOTE — Patient Instructions (Signed)
Pre-Operative Instructions  Congratulations, you have decided to take an important step towards improving your quality of life.  You can be assured that the doctors and staff at [facility] will be with you every step of the way.  Here are some important things you should know:  1. Plan to be at the surgery center/hospital at least 1 (one) hour prior to your scheduled time, unless otherwise directed by the surgical center/hospital staff.  You must have a responsible adult accompany you, remain during the surgery and drive you home.  Make sure you have directions to the surgical center/hospital to ensure you arrive on time. 2. If you are having surgery at [facility] or  hospitals, you will need a copy of your medical history and physical form from your family physician within one month prior to the date of surgery. We will give you a form for your primary physician to complete.  3. We make every effort to accommodate the date you request for surgery.  However, there are times where surgery dates or times have to be moved.  We will contact you as soon as possible if a change in schedule is required.   4. No aspirin/ibuprofen for one week before surgery.  If you are on aspirin, any non-steroidal anti-inflammatory medications (Mobic, Aleve, Ibuprofen) should not be taken seven (7) days prior to your surgery.  You make take Tylenol for pain prior to surgery.  5. Medications - If you are taking daily heart and blood pressure medications, seizure, reflux, allergy, asthma, anxiety, pain or diabetes medications, make sure you notify the surgery center/hospital before the day of surgery so they can tell you which medications you should take or avoid the day of surgery. 6. No food or drink after midnight the night before surgery unless directed otherwise by surgical center/hospital staff. 7. No alcoholic beverages 24-hours prior to surgery.  No smoking 24-hours prior or 24-hours after  surgery. 8. Wear loose pants or shorts. They should be loose enough to fit over bandages, boots, and casts. 9. Don't wear slip-on shoes. Sneakers are preferred. 10. Bring your boot with you to the surgery center/hospital.  Also bring crutches or a walker if your physician has prescribed it for you.  If you do not have this equipment, it will be provided for you after surgery. 11. If you have not been contacted by the surgery center/hospital by the day before your surgery, call to confirm the date and time of your surgery. 12. Leave-time from work may vary depending on the type of surgery you have.  Appropriate arrangements should be made prior to surgery with your employer. 13. Prescriptions will be provided immediately following surgery by your doctor.  Fill these as soon as possible after surgery and take the medication as directed. Pain medications will not be refilled on weekends and must be approved by the doctor. 14. Remove nail polish on the operative foot and avoid getting pedicures prior to surgery. 15. Wash the night before surgery.  The night before surgery wash the foot and leg well with water and the antibacterial soap provided. Be sure to pay special attention to beneath the toenails and in between the toes.  Wash for at least three (3) minutes. Rinse thoroughly with water and dry well with a towel.  Perform this wash unless told not to do so by your physician.  Enclosed: 1 Ice pack (please put in freezer the night before surgery)   1 Hibiclens skin cleaner     Pre-op instructions  If you have any questions regarding the instructions, please do not hesitate to call our office.  [place]: [address], [place], [place] [address] -- [phone]  [place]: [address] [place], [place] [address] -- [phone]  [place]: [address]  [place], [place] [address] -- [phone]   Website: [email or web address] 

## 2019-01-10 NOTE — Progress Notes (Signed)
   Subjective: 58 year old female presenting today for follow-up evaluation of plantar fasciitis of the left foot. She reports severe pain of the heel. She states the injection helped alleviate the pain for about one month. She states she was never able to go to physical therapy. She has been using ice therapy, OTC insoles, taking Meloxicam and Tylenol for treatment. Being on the foot for long periods of time makes the pain worse. Patient is here for further evaluation and treatment.   Past Medical History:  Diagnosis Date  . Diverticulitis      Objective: Physical Exam General: The patient is alert and oriented x3 in no acute distress.  Dermatology: Skin is warm, dry and supple bilateral lower extremities. Negative for open lesions or macerations bilateral.   Vascular: Dorsalis Pedis and Posterior Tibial pulses palpable bilateral.  Capillary fill time is immediate to all digits.  Neurological: Epicritic and protective threshold intact bilateral.   Musculoskeletal: Tenderness to palpation to the plantar aspect of the left heel along the plantar fascia. Limited ROM with ankle joint dorsiflexion left. Positive Silfverskiold test. All other joints range of motion within normal limits bilateral. Strength 5/5 in all groups bilateral.    Assessment: 1. Plantar fasciitis left foot 2. Gastroc equinus left   Plan of Care:  1. Patient evaluated.  2. Today we discussed the conservative versus surgical management of the presenting pathology. The patient opts for surgical management. All possible complications and details of the procedure were explained. All patient questions were answered. No guarantees were expressed or implied. 3. Authorization for surgery was initiated today. Surgery will consist of EPF left; gastroc aponeurosis lengthening left.  4. Refill prescription for Meloxicam provided to patient.  5. Continue using OTC insoles.     6. Return to clinic one week post op.    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    Edrick Kins, DPM [facility]  Dr. Edrick Kins, DPM    [address], [place] [address]                Office [phone]  Fax [phone]

## 2019-01-11 NOTE — Telephone Encounter (Signed)
"  I'm a patient of Dr. Amalia Hailey.  I'm calling to schedule my surgery."  Do you have a date that you like?  "What is his first available date?"  His first available date is February 16, 2019.  "I can't do it then because we're having a shower for my daughter.  Can he do it the following week?"  Yes, he can do it on February 23, 2019.  "Okay great, schedule me for then.  Will you call me later with the time?"  I will not but you will get a call from someone from the surgery center and that person will give you your arrival time.

## 2019-02-17 ENCOUNTER — Telehealth: Payer: Self-pay | Admitting: *Deleted

## 2019-02-17 NOTE — Telephone Encounter (Addendum)
DOS 02/23/2019 ENDOSCOPIC PLANTAR FASCIOTOMY - [id number] AND GASTROCNEMIUS RECESS - [id number] LEFT FOOT  [employer]: Eligibility Date - 04/13/2018 to 04/13/2019   In-Network:  Co-Insurance:  20% Individual   Deductible:  $1500 per Calendar Year   Out-Of-Pocket  Max:  $4,000 per [place]   [phone] - NOT REQUIRED - CONFIRMATION # [phone] - NOT REQUIRED - "                        "  # [phone]

## 2019-02-23 ENCOUNTER — Other Ambulatory Visit: Payer: Self-pay | Admitting: Podiatry

## 2019-02-23 DIAGNOSIS — M722 Plantar fascial fibromatosis: Secondary | ICD-10-CM

## 2019-02-23 DIAGNOSIS — M216X2 Other acquired deformities of left foot: Secondary | ICD-10-CM

## 2019-02-23 MED ORDER — OXYCODONE-ACETAMINOPHEN 5-325 MG PO TABS: 1.0000 | ORAL_TABLET | ORAL | 0 refills | Status: DC | PRN

## 2019-02-23 NOTE — Progress Notes (Signed)
PRN postop 

## 2019-02-27 ENCOUNTER — Encounter: Payer: Self-pay | Admitting: Podiatry

## 2019-03-03 ENCOUNTER — Other Ambulatory Visit: Payer: Self-pay

## 2019-03-03 ENCOUNTER — Encounter: Payer: Self-pay | Admitting: Podiatry

## 2019-03-03 ENCOUNTER — Ambulatory Visit (INDEPENDENT_AMBULATORY_CARE_PROVIDER_SITE_OTHER): Payer: Managed Care, Other (non HMO) | Admitting: Podiatry

## 2019-03-03 VITALS — BP 138/81 | HR 67 | Temp 98.8°F

## 2019-03-03 DIAGNOSIS — Z9889 Other specified postprocedural states: Secondary | ICD-10-CM

## 2019-03-03 DIAGNOSIS — M722 Plantar fascial fibromatosis: Secondary | ICD-10-CM

## 2019-03-03 DIAGNOSIS — M21862 Other specified acquired deformities of left lower leg: Secondary | ICD-10-CM

## 2019-03-03 DIAGNOSIS — M216X2 Other acquired deformities of left foot: Secondary | ICD-10-CM

## 2019-03-12 NOTE — Progress Notes (Signed)
   Subjective:  Patient presents today status post EPF and gastrocnemius recession left. DOS: 02/23/2019. She states she is doing well. She reports some mild soreness that is tolerable with Ibuprofen. There are no worsening factors noted. She has been using the CAM boot and crutches as directed. Patient is here for further evaluation and treatment.    Past Medical History:  Diagnosis Date  . Diverticulitis       Objective/Physical Exam Neurovascular status intact.  Skin incisions appear to be well coapted with sutures and staples intact. No sign of infectious process noted. No dehiscence. No active bleeding noted. Moderate edema noted to the surgical extremity.  Assessment: 1. s/p EPF, gastrocnemius recession left. DOS: 02/23/2019   Plan of Care:  1. Patient was evaluated. 2. Dressing changed.  3. Continue nonweightbearing in CAM boot with crutches.  4. Return to clinic in one week for suture removal.   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    Edrick Kins, DPM [facility]  Dr. Edrick Kins, [place]                                        [place], [place] [address]                Office [phone]  Fax [phone]

## 2019-03-14 ENCOUNTER — Ambulatory Visit (INDEPENDENT_AMBULATORY_CARE_PROVIDER_SITE_OTHER): Payer: Managed Care, Other (non HMO) | Admitting: Podiatry

## 2019-03-14 ENCOUNTER — Other Ambulatory Visit: Payer: Self-pay

## 2019-03-14 ENCOUNTER — Encounter: Payer: Self-pay | Admitting: Podiatry

## 2019-03-14 DIAGNOSIS — M722 Plantar fascial fibromatosis: Secondary | ICD-10-CM

## 2019-03-14 DIAGNOSIS — M216X2 Other acquired deformities of left foot: Secondary | ICD-10-CM

## 2019-03-14 DIAGNOSIS — Z9889 Other specified postprocedural states: Secondary | ICD-10-CM

## 2019-03-14 DIAGNOSIS — M21862 Other specified acquired deformities of left lower leg: Secondary | ICD-10-CM

## 2019-03-14 NOTE — Patient Instructions (Signed)
Today - Begin full weight bearing in CAM boot without crutches  Beginning 03/23/19 - Walking in the boot. But, you can begin taking the boot off and doing range of motion exercises. Also if you have a light resistance band (light resistance)  Christmas Eve - Begin walking in good tennis shoes.

## 2019-03-16 ENCOUNTER — Encounter: Payer: Self-pay | Admitting: Podiatry

## 2019-03-16 NOTE — Progress Notes (Signed)
   Subjective:  Patient presents today status post EPF and gastrocnemius recession left. DOS: 02/23/2019. She states she is doing well. She reports moderate swelling of the left ankle and minimal pain. She has been using the CAM boot as directed. There are no modifying factors noted. Patient is here for further evaluation and treatment.    Past Medical History:  Diagnosis Date  . Diverticulitis       Objective/Physical Exam Neurovascular status intact.  Skin incisions appear to be well coapted with sutures and staples intact. No sign of infectious process noted. No dehiscence. No active bleeding noted. Moderate edema noted to the surgical extremity.  Assessment: 1. s/p EPF, gastrocnemius recession left. DOS: 02/23/2019   Plan of Care:  1. Patient was evaluated. 2. Staples/sutures removed.  3. Continue weightbearing in CAM boot.  4. Patient does not have insurance si I will guide her for home physical therapy. Physical therapy instructions provided.  5. Return to clinic in 4 weeks.    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    Edrick Kins, DPM [facility]  Dr. Edrick Kins, [place]                                        [place], [place] [address]                Office [phone]  Fax [phone]

## 2019-03-21 ENCOUNTER — Other Ambulatory Visit: Payer: Self-pay

## 2019-03-21 ENCOUNTER — Ambulatory Visit (INDEPENDENT_AMBULATORY_CARE_PROVIDER_SITE_OTHER): Payer: Self-pay | Admitting: Podiatry

## 2019-03-21 DIAGNOSIS — Z9889 Other specified postprocedural states: Secondary | ICD-10-CM

## 2019-03-21 DIAGNOSIS — M21862 Other specified acquired deformities of left lower leg: Secondary | ICD-10-CM

## 2019-03-21 DIAGNOSIS — M722 Plantar fascial fibromatosis: Secondary | ICD-10-CM

## 2019-03-21 DIAGNOSIS — M216X2 Other acquired deformities of left foot: Secondary | ICD-10-CM

## 2019-03-24 NOTE — Progress Notes (Signed)
   Subjective:  Patient presents today status post EPF and gastrocnemius recession left. DOS: 02/23/2019. She reports continued swelling of the foot. She reports some intermittent associated numbness and aching of the heel. She denies any severe pain. She has been using the CAM boot as directed. She has been doing the physical therapy exercises at home as instructed. Patient is here for further evaluation and treatment.   Past Medical History:  Diagnosis Date  . Diverticulitis       Objective/Physical Exam Neurovascular status intact.  Skin incisions appear to be well coapted No sign of infectious process noted. No dehiscence. No active bleeding noted. Moderate edema noted to the surgical extremity.  Assessment: 1. s/p EPF, gastrocnemius recession left. DOS: 02/23/2019   Plan of Care:  1. Patient was evaluated. 2. Compression anklet dispensed.  3. Continue weightbearing in CAM boot.  4. Patient does not have insurance so I will guide her for home physical therapy. Physical therapy instructions provided.  5. Return to clinic in 4 weeks.   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    Edrick Kins, DPM [facility]  Dr. Edrick Kins, [address]                                        [place], [place] [address]                Office [phone]  Fax [phone]

## 2019-03-28 ENCOUNTER — Other Ambulatory Visit: Payer: Managed Care, Other (non HMO)

## 2019-04-18 ENCOUNTER — Other Ambulatory Visit: Payer: Self-pay

## 2019-04-18 ENCOUNTER — Ambulatory Visit (INDEPENDENT_AMBULATORY_CARE_PROVIDER_SITE_OTHER): Payer: Self-pay | Admitting: Podiatry

## 2019-04-18 ENCOUNTER — Encounter: Payer: Self-pay | Admitting: Podiatry

## 2019-04-18 DIAGNOSIS — M216X2 Other acquired deformities of left foot: Secondary | ICD-10-CM

## 2019-04-18 DIAGNOSIS — M722 Plantar fascial fibromatosis: Secondary | ICD-10-CM

## 2019-04-18 DIAGNOSIS — M21862 Other specified acquired deformities of left lower leg: Secondary | ICD-10-CM

## 2019-04-18 DIAGNOSIS — Z9889 Other specified postprocedural states: Secondary | ICD-10-CM

## 2019-04-20 NOTE — Progress Notes (Signed)
   Subjective:  Patient presents today status post EPF and gastrocnemius recession left. DOS: 02/23/2019. She states she is doing well and denies any pain. She reports some increased swelling to the posterior leg with continued numbness. She states the symptoms are worse at night. She has been using the CAM boot and the compression anklet as directed. Patient is here for further evaluation and treatment.   Past Medical History:  Diagnosis Date  . Diverticulitis       Objective/Physical Exam Neurovascular status intact.  Skin incisions appear to be well coapted. No sign of infectious process noted. No dehiscence. No active bleeding noted. Moderate edema noted to the surgical extremity.  Assessment: 1. s/p EPF, gastrocnemius recession left. DOS: 02/23/2019   Plan of Care:  1. Patient was evaluated. 2. May resume full activity with no restrictions.  3. Recommended good shoe gear.  4. Return to clinic as needed.    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    Felecia Shelling, DPM [facility]  Dr. Felecia Shelling, DPM    [address]                                        [place], [place] [address]                Office [phone]  Fax [phone]

## 2019-05-07 ENCOUNTER — Other Ambulatory Visit: Payer: Self-pay | Admitting: Podiatry

## 2019-10-13 ENCOUNTER — Ambulatory Visit: Payer: Self-pay | Admitting: Podiatry

## 2019-10-13 ENCOUNTER — Other Ambulatory Visit: Payer: Self-pay

## 2019-11-17 DIAGNOSIS — R399 Unspecified symptoms and signs involving the genitourinary system: Secondary | ICD-10-CM | POA: Diagnosis not present

## 2019-11-24 DIAGNOSIS — R1031 Right lower quadrant pain: Secondary | ICD-10-CM | POA: Diagnosis not present

## 2019-11-24 DIAGNOSIS — N3000 Acute cystitis without hematuria: Secondary | ICD-10-CM | POA: Diagnosis not present

## 2019-11-24 DIAGNOSIS — M545 Low back pain: Secondary | ICD-10-CM | POA: Diagnosis not present

## 2019-12-13 DIAGNOSIS — R399 Unspecified symptoms and signs involving the genitourinary system: Secondary | ICD-10-CM | POA: Diagnosis not present

## 2019-12-27 ENCOUNTER — Other Ambulatory Visit: Payer: Self-pay

## 2020-01-01 DIAGNOSIS — S39012A Strain of muscle, fascia and tendon of lower back, initial encounter: Secondary | ICD-10-CM | POA: Diagnosis not present

## 2020-02-06 ENCOUNTER — Other Ambulatory Visit: Payer: Self-pay | Admitting: Internal Medicine

## 2020-02-06 DIAGNOSIS — Z1231 Encounter for screening mammogram for malignant neoplasm of breast: Secondary | ICD-10-CM

## 2020-03-15 ENCOUNTER — Other Ambulatory Visit: Payer: Self-pay

## 2020-03-15 ENCOUNTER — Ambulatory Visit
Admission: RE | Admit: 2020-03-15 | Discharge: 2020-03-15 | Disposition: A | Discharge status: Home / Self Care | Payer: BLUE CROSS/BLUE SHIELD | Source: Ambulatory Visit | Attending: Internal Medicine | Admitting: Internal Medicine

## 2020-03-15 DIAGNOSIS — Z1231 Encounter for screening mammogram for malignant neoplasm of breast: Secondary | ICD-10-CM | POA: Diagnosis not present

## 2020-04-16 DIAGNOSIS — I10 Essential (primary) hypertension: Secondary | ICD-10-CM | POA: Diagnosis not present

## 2020-04-25 DIAGNOSIS — G8929 Other chronic pain: Secondary | ICD-10-CM | POA: Diagnosis not present

## 2020-04-25 DIAGNOSIS — Z720 Tobacco use: Secondary | ICD-10-CM | POA: Diagnosis not present

## 2020-04-25 DIAGNOSIS — I10 Essential (primary) hypertension: Secondary | ICD-10-CM | POA: Diagnosis not present

## 2020-04-25 DIAGNOSIS — F32A Depression, unspecified: Secondary | ICD-10-CM | POA: Diagnosis not present

## 2020-05-07 ENCOUNTER — Other Ambulatory Visit: Payer: Self-pay | Admitting: Internal Medicine

## 2020-05-07 DIAGNOSIS — R399 Unspecified symptoms and signs involving the genitourinary system: Secondary | ICD-10-CM | POA: Diagnosis not present

## 2020-05-07 DIAGNOSIS — E049 Nontoxic goiter, unspecified: Secondary | ICD-10-CM

## 2020-05-16 ENCOUNTER — Other Ambulatory Visit: Payer: Self-pay

## 2020-05-16 ENCOUNTER — Ambulatory Visit
Admission: RE | Admit: 2020-05-16 | Discharge: 2020-05-16 | Disposition: A | Discharge status: Home / Self Care | Payer: BC Managed Care – PPO | Source: Ambulatory Visit | Attending: Internal Medicine | Admitting: Internal Medicine

## 2020-05-16 DIAGNOSIS — E049 Nontoxic goiter, unspecified: Secondary | ICD-10-CM

## 2020-05-20 DIAGNOSIS — Z8371 Family history of colonic polyps: Secondary | ICD-10-CM | POA: Diagnosis not present

## 2020-05-20 DIAGNOSIS — R1314 Dysphagia, pharyngoesophageal phase: Secondary | ICD-10-CM | POA: Diagnosis not present

## 2020-05-20 DIAGNOSIS — Z86010 Personal history of colonic polyps: Secondary | ICD-10-CM | POA: Diagnosis not present

## 2020-05-20 DIAGNOSIS — K591 Functional diarrhea: Secondary | ICD-10-CM | POA: Diagnosis not present

## 2020-07-09 DIAGNOSIS — Z01818 Encounter for other preprocedural examination: Secondary | ICD-10-CM | POA: Diagnosis not present

## 2020-07-12 DIAGNOSIS — R197 Diarrhea, unspecified: Secondary | ICD-10-CM | POA: Diagnosis not present

## 2020-07-12 DIAGNOSIS — K640 First degree hemorrhoids: Secondary | ICD-10-CM | POA: Diagnosis not present

## 2020-07-12 DIAGNOSIS — K635 Polyp of colon: Secondary | ICD-10-CM | POA: Diagnosis not present

## 2020-07-12 DIAGNOSIS — K209 Esophagitis, unspecified without bleeding: Secondary | ICD-10-CM | POA: Diagnosis not present

## 2020-07-12 DIAGNOSIS — Z86010 Personal history of colonic polyps: Secondary | ICD-10-CM | POA: Diagnosis not present

## 2020-07-12 DIAGNOSIS — R1313 Dysphagia, pharyngeal phase: Secondary | ICD-10-CM | POA: Diagnosis not present

## 2020-07-12 DIAGNOSIS — R1314 Dysphagia, pharyngoesophageal phase: Secondary | ICD-10-CM | POA: Diagnosis not present

## 2020-07-12 DIAGNOSIS — K5730 Diverticulosis of large intestine without perforation or abscess without bleeding: Secondary | ICD-10-CM | POA: Diagnosis not present

## 2020-07-12 DIAGNOSIS — Z791 Long term (current) use of non-steroidal anti-inflammatories (NSAID): Secondary | ICD-10-CM | POA: Diagnosis not present

## 2020-07-12 HISTORY — PX: ESOPHAGOGASTRODUODENOSCOPY: SHX1529

## 2020-08-21 DIAGNOSIS — F411 Generalized anxiety disorder: Secondary | ICD-10-CM | POA: Diagnosis not present

## 2020-08-26 DIAGNOSIS — F411 Generalized anxiety disorder: Secondary | ICD-10-CM | POA: Diagnosis not present

## 2020-08-30 ENCOUNTER — Emergency Department
Admission: EM | Admit: 2020-08-30 | Discharge: 2020-08-30 | Disposition: A | Discharge status: Home / Self Care | Payer: BC Managed Care – PPO | Attending: Emergency Medicine | Admitting: Emergency Medicine

## 2020-08-30 ENCOUNTER — Emergency Department: Payer: BC Managed Care – PPO

## 2020-08-30 ENCOUNTER — Other Ambulatory Visit: Payer: Self-pay

## 2020-08-30 DIAGNOSIS — S6991XA Unspecified injury of right wrist, hand and finger(s), initial encounter: Secondary | ICD-10-CM | POA: Diagnosis not present

## 2020-08-30 DIAGNOSIS — I10 Essential (primary) hypertension: Secondary | ICD-10-CM | POA: Insufficient documentation

## 2020-08-30 DIAGNOSIS — S61411A Laceration without foreign body of right hand, initial encounter: Secondary | ICD-10-CM | POA: Insufficient documentation

## 2020-08-30 DIAGNOSIS — Z87891 Personal history of nicotine dependence: Secondary | ICD-10-CM | POA: Diagnosis not present

## 2020-08-30 DIAGNOSIS — Z23 Encounter for immunization: Secondary | ICD-10-CM | POA: Diagnosis not present

## 2020-08-30 DIAGNOSIS — Z79899 Other long term (current) drug therapy: Secondary | ICD-10-CM | POA: Diagnosis not present

## 2020-08-30 DIAGNOSIS — Y9389 Activity, other specified: Secondary | ICD-10-CM | POA: Diagnosis not present

## 2020-08-30 DIAGNOSIS — Y92094 Garage of other non-institutional residence as the place of occurrence of the external cause: Secondary | ICD-10-CM | POA: Insufficient documentation

## 2020-08-30 DIAGNOSIS — W268XXA Contact with other sharp object(s), not elsewhere classified, initial encounter: Secondary | ICD-10-CM | POA: Diagnosis not present

## 2020-08-30 HISTORY — DX: Unspecified osteoarthritis, unspecified site: M19.90

## 2020-08-30 HISTORY — DX: Anxiety disorder, unspecified: F41.9

## 2020-08-30 HISTORY — DX: Fibromyalgia: M79.7

## 2020-08-30 HISTORY — DX: Depression, unspecified: F32.A

## 2020-08-30 HISTORY — DX: Essential (primary) hypertension: I10

## 2020-08-30 MED ORDER — LIDOCAINE HCL (PF) 1 % IJ SOLN
INTRAMUSCULAR | Status: AC
  Administered 2020-08-30: 5 mL
  Filled 2020-08-30: qty 5

## 2020-08-30 MED ORDER — CEPHALEXIN 500 MG PO CAPS: 500.0000 mg | ORAL_CAPSULE | Freq: Three times a day (TID) | ORAL | 0 refills | Status: AC

## 2020-08-30 MED ORDER — TETANUS-DIPHTH-ACELL PERTUSSIS 5-2.5-18.5 LF-MCG/0.5 IM SUSY
0.5000 mL | PREFILLED_SYRINGE | Freq: Once | INTRAMUSCULAR | Status: AC
  Administered 2020-08-30: 0.5 mL via INTRAMUSCULAR
  Filled 2020-08-30: qty 0.5

## 2020-08-30 MED ORDER — LIDOCAINE HCL 1 % IJ SOLN: 5.0000 mL | Freq: Once | INTRAMUSCULAR | Status: AC

## 2020-08-30 NOTE — ED Provider Notes (Signed)
[facility]-EMERGENCY DEPARTMENT  ____________________________________________  Time seen: Approximately 9:55 PM  I have reviewed the triage vital signs and the nursing notes.   HISTORY  Chief Complaint Laceration   Historian Patient    HPI Allison Alvarez is a 60 y.o. female presents to the emergency department with a 3 cm right palm laceration.  Patient states that she cut her hand on something in her garage while trying to clean out boxes from her parents home.  She denies numbness or tingling in the right hand.  She cannot recall her last tetanus shot.  No other alleviating measures have been attempted.   Past Medical History:  Diagnosis Date  . Anxiety   . Arthritis   . Depression   . Diverticulitis   . Fibromyalgia   . Hypertension      Immunizations up to date:  Yes.     Past Medical History:  Diagnosis Date  . Anxiety   . Arthritis   . Depression   . Diverticulitis   . Fibromyalgia   . Hypertension     Patient Active Problem List   Diagnosis Date Noted  . Hypertension, essential 02/24/2018  . Diverticulitis of colon 06/16/2017  . Diverticulosis of intestine without bleeding 05/07/2017  . Other chronic pain 05/07/2017  . Diverticulitis 11/13/2015  . Personal history of other diseases of the digestive system 11/12/2015  . Costochondritis 02/22/2015  . Mastodynia, female 02/22/2015  . Persistent depressive disorder 12/20/2014  . GAD (generalized anxiety disorder) 10/31/2014  . Major depressive disorder with single episode 10/31/2014  . Neurodermatitis 10/31/2014  . Tobacco use disorder 10/31/2014  . Urgency incontinence 02/28/2013  . Incontinence without sensory awareness 08/22/2009  . Attention deficit disorder 08/16/2008    Past Surgical History:  Procedure Laterality Date  . ABDOMINAL HYSTERECTOMY    . APPENDECTOMY    . AV FISTULA INSERTION W/ RF MAGNETIC GUIDANCE    . BLADDER REPAIR    . CHOLECYSTECTOMY    . COLONOSCOPY  07/2016    Prior  to Admission medications   Medication Sig Start Date End Date Taking? Authorizing Provider  cephALEXin (KEFLEX) 500 MG capsule Take 1 capsule (500 mg total) by mouth 3 (three) times daily for 7 days. 08/30/20 09/06/20 Yes Pia Mau M, PA-C  albuterol (PROVENTIL HFA;VENTOLIN HFA) 108 (90 Base) MCG/ACT inhaler Inhale 2 puffs into the lungs every 6 (six) hours as needed for wheezing or shortness of breath (or cough). 02/23/18   McManama, Richardson Dopp, FNP  ALPRAZolam (XANAX) 0.25 MG tablet TAKE 1 TABLET(0.25 MG) BY MOUTH TWICE DAILY AS NEEDED FOR ANXIETY 08/16/18   [provider]  citalopram (CELEXA) 40 MG tablet TAKE 1 TABLET(40 MG) BY MOUTH DAILY 01/30/17   [provider]  diphenhydrAMINE (BENADRYL) 25 MG tablet Take 25 mg by mouth every 8 (eight) hours as needed for allergies.    [provider]  fluticasone (FLONASE) 50 MCG/ACT nasal spray Place 2 sprays into both nostrils daily. 01/06/18   Ralene Muskrat, FNP  gabapentin (NEURONTIN) 300 MG capsule Take by mouth. 11/12/17 12/21/18  [provider]  losartan (COZAAR) 25 MG tablet Take by mouth. 11/12/17 12/21/18  [provider]  meloxicam (MOBIC) 15 MG tablet TAKE 1 TABLET(15 MG) BY MOUTH DAILY 05/08/19   Felecia Shelling, DPM  oxyCODONE-acetaminophen (PERCOCET) 5-325 MG tablet Take 1 tablet by mouth every 4 (four) hours as needed for severe pain. 02/23/19   Felecia Shelling, DPM    Allergies Morphine and Hydromorphone  Family  History  Problem Relation Age of Onset  . Breast cancer Paternal Aunt 75    Social History Social History   Tobacco Use  . Smoking status: Former Smoker    Packs/day: 1.00  . Smokeless tobacco: Never Used  Vaping Use  . Vaping Use: Every day  Substance Use Topics  . Alcohol use: No  . Drug use: No     Review of Systems  Constitutional: No fever/chills Eyes:  No discharge ENT: No upper respiratory complaints. Respiratory: no cough. No SOB/ use of accessory muscles  to breath Gastrointestinal:   No nausea, no vomiting.  No diarrhea.  No constipation. Musculoskeletal: Patient has right hand laceration.  Skin: Negative for rash, abrasions, lacerations, ecchymosis.   ____________________________________________   PHYSICAL EXAM:  VITAL SIGNS: ED Triage Vitals [08/30/20 2032]  Enc Vitals Group     BP (!) 181/102     Pulse Rate 64     Resp 18     Temp 98.5 F (36.9 C)     Temp Source Oral     SpO2 96 %     Weight 175 lb (79.4 kg)     Height 5\' 8"  (1.727 m)     Head Circumference      Peak Flow      Pain Score 0     Pain Loc      Pain Edu?      Excl. in GC?      Constitutional: Alert and oriented. Well appearing and in no acute distress. Eyes: Conjunctivae are normal. PERRL. EOMI. Head: Atraumatic. ENT: Cardiovascular: Normal rate, regular rhythm. Normal S1 and S2.  Good peripheral circulation. Respiratory: Normal respiratory effort without tachypnea or retractions. Lungs CTAB. Good air entry to the bases with no decreased or absent breath sounds Gastrointestinal: Bowel sounds x 4 quadrants. Soft and nontender to palpation. No guarding or rigidity. No distention. Musculoskeletal: Full range of motion to all extremities. No obvious deformities noted.  Palpable radial and ulnar pulses bilaterally and symmetrically.  Capillary refill less than 2 seconds on the right. Neurologic:  Normal for age. No gross focal neurologic deficits are appreciated.  Skin: Patient has a 3 cm right pulmonary laceration deep to underlying adipose tissue of the right hand.   Psychiatric: Mood and affect are normal for age. Speech and behavior are normal.   ____________________________________________   LABS (all labs ordered are listed, but only abnormal results are displayed)  Labs Reviewed - No data to display ____________________________________________  EKG   ____________________________________________  RADIOLOGY , personally  viewed and evaluated these images (plain radiographs) as part of my medical decision making, as well as reviewing the written report by the radiologist.    DG Hand Complete Right  Result Date: 08/30/2020 CLINICAL DATA:  Laceration to the palm of the hand, initial encounter EXAM: RIGHT HAND - COMPLETE 3+ VIEW COMPARISON:  None. FINDINGS: No acute bony abnormality is noted. Soft tissue irregularity is noted in the palm consistent with the patient's given clinical history. No radiopaque foreign body is noted. IMPRESSION: No acute bony abnormality is noted. Electronically Signed   By: 09/01/2020 M.D.   On: 08/30/2020 22:13    ____________________________________________    PROCEDURES  Procedure(s) performed:     05/22/2022Marland KitchenLaceration Repair  Date/Time: 08/30/2020 9:57 PM Performed by: 09/01/2020, PA-C Authorized by: Orvil Feil, PA-C   Consent:    Consent obtained:  Verbal   Consent given by:  Patient   Risks  discussed:  Infection and pain Universal protocol:    Procedure explained and questions answered to patient or proxy's satisfaction: yes     Patient identity confirmed:  Verbally with patient Anesthesia:    Anesthesia method:  None Laceration details:    Location:  Hand   Length (cm):  3   Depth (mm):  5 Pre-procedure details:    Preparation:  Patient was prepped and draped in usual sterile fashion Exploration:    Wound exploration: wound explored through full range of motion     Contaminated: no   Treatment:    Area cleansed with:  Povidone-iodine and saline   Amount of cleaning:  Standard   Irrigation solution:  Sterile saline   Visualized foreign bodies/material removed: no   Skin repair:    Repair method:  Sutures   Suture size:  4-0   Suture material:  Nylon   Number of sutures:  6 Approximation:    Approximation:  Close Repair type:    Repair type:  Simple Post-procedure details:    Dressing:  Open (no dressing)   Procedure completion:  Tolerated  well, no immediate complications       Medications  lidocaine (XYLOCAINE) 1 % (with pres) injection 5 mL (5 mLs Infiltration Given by Other 08/30/20 2138)  Tdap (BOOSTRIX) injection 0.5 mL (0.5 mLs Intramuscular Given 08/30/20 2209)     ____________________________________________   INITIAL IMPRESSION / ASSESSMENT AND PLAN / ED COURSE  Pertinent labs & imaging results that were available during my care of the patient were reviewed by me and considered in my medical decision making (see chart for details).      Assessment and plan Laceration:  60 year old female presents to the emergency department with a right hand laceration.  Patient was hypertensive at triage but vital signs were otherwise reassuring.  Patient underwent laceration repair in the emergency department without complication.  She was advised to have external sutures removed by primary care in 7 days.     ____________________________________________  FINAL CLINICAL IMPRESSION(S) / ED DIAGNOSES  Final diagnoses:  Laceration of right hand without foreign body, initial encounter      NEW MEDICATIONS STARTED DURING THIS VISIT:  ED Discharge Orders         Ordered    cephALEXin (KEFLEX) 500 MG capsule  3 times daily        08/30/20 2211              This chart was dictated using voice recognition software/Dragon. Despite best efforts to proofread, errors can occur which can change the meaning. Any change was purely unintentional.     Orvil Feil, PA-C 08/30/20 7371    Phineas Semen, MD 08/30/20 2312

## 2020-08-30 NOTE — ED Triage Notes (Signed)
Pt presents with a laceration to the palm of her R hand. Pt unsure what hand was cut on. Bleeding controlled. Tetanus not UTD.

## 2020-08-30 NOTE — Discharge Instructions (Addendum)
Take Keflex three times daily for the next seven days.  

## 2020-09-04 DIAGNOSIS — F411 Generalized anxiety disorder: Secondary | ICD-10-CM | POA: Diagnosis not present

## 2020-09-12 DIAGNOSIS — F411 Generalized anxiety disorder: Secondary | ICD-10-CM | POA: Diagnosis not present

## 2020-09-20 DIAGNOSIS — K5792 Diverticulitis of intestine, part unspecified, without perforation or abscess without bleeding: Secondary | ICD-10-CM | POA: Diagnosis not present

## 2020-10-08 ENCOUNTER — Ambulatory Visit: Payer: Self-pay | Admitting: Surgery

## 2020-10-08 DIAGNOSIS — K5790 Diverticulosis of intestine, part unspecified, without perforation or abscess without bleeding: Secondary | ICD-10-CM | POA: Diagnosis not present

## 2020-10-08 MED ORDER — INDOCYANINE GREEN 25 MG IV SOLR
5.0000 mg | INTRAVENOUS | Status: AC
  Filled 2020-10-08: qty 10

## 2020-10-08 NOTE — H&P (Signed)
Subjective:    CC: Diverticulitis [K57.92]   HPI:  Allison Alvarez is a 60 y.o. female who was referred by [facility] for evaluation of above. Chronic requiring antibiotics to resolve most of pain, but never resolves.  Pain returns as soon as antibiotics discontinued. Sharp, located in [place].  Associated with fatigue, exacerbated by nothing specific.   Last colonoscopy in 07/2020, benign polyp, asked to return in 65yrs.   Past Medical History:  has a past medical history of Anxiety, Arthritis, Depression, Diverticulitis, Fibromyalgia, and Hypertension.   Past Surgical History:  has a past surgical history that includes Colonoscopy (07/12/2020); egd (07/12/2020); Colonoscopy (07/2016); Abdominal hysterectomy; appendectomy; AV fistula insertion w/ RF magnetic guidance; repair bladder exstrophy; and cholecystectomy.   Family History: family history includes Breast cancer (age of onset: 77) in her paternal aunt; Colon cancer (age of onset: 39) in her maternal grandmother.   Social History:  reports that she quit smoking about 8 months ago. Her smoking use included cigarettes. She has a 48.00 pack-year smoking history. She has never used smokeless tobacco. She reports current alcohol use. She reports that she does not use drugs.   Current Medications: has a current medication list which includes the following prescription(s): alprazolam, buspirone, citalopram, erythromycin base, gabapentin, losartan, meloxicam, metoprolol tartrate, metronidazole, and trazodone.   Allergies:      Allergies  Allergen Reactions   Morphine Itching   Hydromorphone (Bulk) Itching      ROS:  A 15 point review of systems was performed and pertinent positives and negatives noted in HPI   Objective:    LMP 04/13/2006 (Approximate)    Constitutional :  alert, cooperative, appears stated age, and no distress  Lymphatics/Throat:  no asymmetry, masses, or scars  Respiratory:  clear to auscultation bilaterally   Cardiovascular:  regular rate and rhythm  Gastrointestinal: soft, non-tender; bowel sounds normal; no masses,  no organomegaly and soft, non-tender; bowel sounds normal; no masses,  no organomegaly.    Musculoskeletl: Steady gait and movement  Skin: Cool and moist  Psychiatric: Normal affect, non-agitated, not confused         LABS:  N/a   RADS: n/a   Assessment:       Diverticulitis [K57.92]   Plan:    Chronic, recurrent with long term antibiotic use.  No other obvious cause and history consistent, so will proceed with surgical excision.     The risk of laparoscopic colon resection surgery includes, but not limited to, recurrence, bleeding, chronic pain, post-op infxn, post-op SBO or ileus, hernias, resection of bowel, re-anastamosis, possible ostomy placement and need for re-operation to address said risks. The risks of general anesthetic, if used, includes MI, CVA, sudden death or even reaction to anesthetic medications also discussed. Alternatives include continued observation.  Benefits include possible symptom relief, preventing further decline in health and possible death.   Typical post-op recovery time of additional days in hospital for observation afterwards also discussed.   Prep ordered.  Will proceed with ERAS protocol as well.  Pending medical clearance and workup as noted above.     The patient verbalized understanding and all questions were answered to the patient's satisfaction.

## 2020-10-09 ENCOUNTER — Other Ambulatory Visit: Payer: BC Managed Care – PPO

## 2020-10-17 DIAGNOSIS — I10 Essential (primary) hypertension: Secondary | ICD-10-CM | POA: Diagnosis not present

## 2020-10-17 DIAGNOSIS — E049 Nontoxic goiter, unspecified: Secondary | ICD-10-CM | POA: Diagnosis not present

## 2020-10-24 DIAGNOSIS — I10 Essential (primary) hypertension: Secondary | ICD-10-CM | POA: Diagnosis not present

## 2020-10-24 DIAGNOSIS — E538 Deficiency of other specified B group vitamins: Secondary | ICD-10-CM | POA: Diagnosis not present

## 2020-10-24 DIAGNOSIS — F32A Depression, unspecified: Secondary | ICD-10-CM | POA: Diagnosis not present

## 2020-10-24 DIAGNOSIS — Z0001 Encounter for general adult medical examination with abnormal findings: Secondary | ICD-10-CM | POA: Diagnosis not present

## 2020-10-29 ENCOUNTER — Other Ambulatory Visit
Admission: RE | Admit: 2020-10-29 | Discharge: 2020-10-29 | Disposition: A | Discharge status: Home / Self Care | Payer: BC Managed Care – PPO | Source: Ambulatory Visit | Attending: Surgery | Admitting: Surgery

## 2020-10-29 ENCOUNTER — Other Ambulatory Visit: Payer: Self-pay

## 2020-10-29 HISTORY — DX: Nausea with vomiting, unspecified: Z98.890

## 2020-10-29 HISTORY — DX: Nausea with vomiting, unspecified: R11.2

## 2020-10-29 HISTORY — DX: Ventricular premature depolarization: I49.3

## 2020-10-29 NOTE — Patient Instructions (Addendum)
Your procedure is scheduled on: Monday, August 1 Report to the Registration Desk on the 1st floor of the [facility] To find out your arrival time, please call [phone] between 1PM - 3PM on: Friday, July 29  REMEMBER: Instructions that are not followed completely may result in serious medical risk, up to and including death; or upon the discretion of your surgeon and anesthesiologist your surgery may need to be rescheduled.  Do not eat food after midnight the night before surgery.  No gum chewing, lozengers or hard candies.  Follow the diet and bowel prep instructions that you were given by Dr. Tonna Boehringer.  TAKE THESE MEDICATIONS THE MORNING OF SURGERY WITH A SIP OF WATER:  Buspirone Citalopram Metoprolol  One week prior to surgery: starting July 25 Stop MELOXICAM, Anti-inflammatories (NSAIDS) such as Advil, Aleve, Ibuprofen, Motrin, Naproxen, Naprosyn and Aspirin based products such as Excedrin, Goodys Powder, BC Powder. Stop ANY OVER THE COUNTER supplements until after surgery. You may however, continue to take Tylenol if needed for pain up until the day of surgery.  No Alcohol for 24 hours before or after surgery.  No Smoking including e-cigarettes for 24 hours prior to surgery.  No chewable tobacco products for at least 6 hours prior to surgery.  No nicotine patches on the day of surgery.  Do not use any "recreational" drugs for at least a week prior to your surgery.  Please be advised that the combination of cocaine and anesthesia may have negative outcomes, up to and including death. If you test positive for cocaine, your surgery will be cancelled.  On the morning of surgery brush your teeth with toothpaste and water, you may rinse your mouth with mouthwash if you wish. Do not swallow any toothpaste or mouthwash.  Do not wear jewelry, make-up, hairpins, clips or nail polish.  Do not wear lotions, powders, or perfumes.   Do not shave body from the neck down 48 hours  prior to surgery just in case you cut yourself which could leave a site for infection.  Also, freshly shaved skin may become irritated if using the CHG soap.  Contact lenses, hearing aids and dentures may not be worn into surgery.  Do not bring valuables to the hospital. [facility] is not responsible for any missing/lost belongings or valuables.   Use CHG Soap as directed on instruction sheet.  Notify your doctor if there is any change in your medical condition (cold, fever, infection).  Wear comfortable clothing (specific to your surgery type) to the hospital.  After surgery, you can help prevent lung complications by doing breathing exercises.  Take deep breaths and cough every 1-2 hours. Your doctor may order a device called an Incentive Spirometer to help you take deep breaths. When coughing or sneezing, hold a pillow firmly against your incision with both hands. This is called "splinting." Doing this helps protect your incision. It also decreases belly discomfort.  If you are being admitted to the hospital overnight, leave your suitcase in the car. After surgery it may be brought to your room.  Please call the Pre-admissions Testing Dept. at [phone] if you have any questions about these instructions.  Surgery Visitation Policy:  Patients undergoing a surgery or procedure may have one family member or support person with them as long as that person is not COVID-19 positive or experiencing its symptoms.  That person may remain in the waiting area during the procedure.  Inpatient Visitation:    Visiting hours are 7  a.m. to 8 p.m. Inpatients will be allowed two visitors daily. The visitors may change each day during the patient's stay. No visitors under the age of 56. Any visitor under the age of 28 must be accompanied by an adult. The visitor must pass COVID-19 screenings, use hand sanitizer when entering and exiting the patient's room and wear a mask at all times, including  in the patient's room. Patients must also wear a mask when staff or their visitor are in the room. Masking is required regardless of vaccination status.

## 2020-10-31 DIAGNOSIS — E538 Deficiency of other specified B group vitamins: Secondary | ICD-10-CM | POA: Diagnosis not present

## 2020-11-04 ENCOUNTER — Encounter
Admission: RE | Admit: 2020-11-04 | Discharge: 2020-11-04 | Disposition: A | Discharge status: Home / Self Care | Payer: BC Managed Care – PPO | Source: Ambulatory Visit | Attending: Surgery | Admitting: Surgery

## 2020-11-04 ENCOUNTER — Other Ambulatory Visit: Payer: Self-pay

## 2020-11-04 ENCOUNTER — Inpatient Hospital Stay: Admission: RE | Admit: 2020-11-04 | Payer: BC Managed Care – PPO | Source: Ambulatory Visit

## 2020-11-04 DIAGNOSIS — I10 Essential (primary) hypertension: Secondary | ICD-10-CM | POA: Insufficient documentation

## 2020-11-04 DIAGNOSIS — Z01818 Encounter for other preprocedural examination: Secondary | ICD-10-CM | POA: Diagnosis not present

## 2020-11-04 LAB — TYPE AND SCREEN
ABO/RH(D): A POS
Antibody Screen: NEGATIVE

## 2020-11-07 ENCOUNTER — Other Ambulatory Visit: Payer: Self-pay

## 2020-11-07 ENCOUNTER — Other Ambulatory Visit
Admission: RE | Admit: 2020-11-07 | Discharge: 2020-11-07 | Disposition: A | Discharge status: Home / Self Care | Payer: BC Managed Care – PPO | Source: Ambulatory Visit | Attending: Surgery | Admitting: Surgery

## 2020-11-07 DIAGNOSIS — Z01812 Encounter for preprocedural laboratory examination: Secondary | ICD-10-CM | POA: Diagnosis not present

## 2020-11-07 DIAGNOSIS — Z20822 Contact with and (suspected) exposure to covid-19: Secondary | ICD-10-CM | POA: Insufficient documentation

## 2020-11-07 DIAGNOSIS — E538 Deficiency of other specified B group vitamins: Secondary | ICD-10-CM | POA: Diagnosis not present

## 2020-11-07 LAB — SARS CORONAVIRUS 2 (TAT 6-24 HRS): SARS Coronavirus 2: NEGATIVE

## 2020-11-11 ENCOUNTER — Other Ambulatory Visit: Payer: Self-pay

## 2020-11-11 ENCOUNTER — Ambulatory Visit: Payer: BC Managed Care – PPO | Admitting: Certified Registered"

## 2020-11-11 ENCOUNTER — Inpatient Hospital Stay
Admission: AD | Admit: 2020-11-11 | Discharge: 2020-11-14 | DRG: 331 | Disposition: A | Discharge status: Home / Self Care | Payer: BC Managed Care – PPO | Attending: Surgery | Admitting: Surgery

## 2020-11-11 ENCOUNTER — Encounter: Payer: Self-pay | Admitting: Surgery

## 2020-11-11 ENCOUNTER — Encounter
Admission: AD | Disposition: A | Discharge status: Home / Self Care | Payer: Self-pay | Source: Home / Self Care | Attending: Surgery

## 2020-11-11 DIAGNOSIS — Z79899 Other long term (current) drug therapy: Secondary | ICD-10-CM | POA: Diagnosis not present

## 2020-11-11 DIAGNOSIS — Z885 Allergy status to narcotic agent status: Secondary | ICD-10-CM

## 2020-11-11 DIAGNOSIS — Z792 Long term (current) use of antibiotics: Secondary | ICD-10-CM

## 2020-11-11 DIAGNOSIS — K5790 Diverticulosis of intestine, part unspecified, without perforation or abscess without bleeding: Secondary | ICD-10-CM | POA: Diagnosis not present

## 2020-11-11 DIAGNOSIS — K5732 Diverticulitis of large intestine without perforation or abscess without bleeding: Principal | ICD-10-CM | POA: Diagnosis present

## 2020-11-11 DIAGNOSIS — Z9049 Acquired absence of other specified parts of digestive tract: Secondary | ICD-10-CM

## 2020-11-11 DIAGNOSIS — I10 Essential (primary) hypertension: Secondary | ICD-10-CM | POA: Diagnosis not present

## 2020-11-11 DIAGNOSIS — M797 Fibromyalgia: Secondary | ICD-10-CM | POA: Diagnosis not present

## 2020-11-11 DIAGNOSIS — Z87891 Personal history of nicotine dependence: Secondary | ICD-10-CM | POA: Diagnosis not present

## 2020-11-11 DIAGNOSIS — Z791 Long term (current) use of non-steroidal anti-inflammatories (NSAID): Secondary | ICD-10-CM

## 2020-11-11 DIAGNOSIS — K5730 Diverticulosis of large intestine without perforation or abscess without bleeding: Secondary | ICD-10-CM | POA: Diagnosis not present

## 2020-11-11 DIAGNOSIS — K5792 Diverticulitis of intestine, part unspecified, without perforation or abscess without bleeding: Secondary | ICD-10-CM | POA: Diagnosis present

## 2020-11-11 HISTORY — PX: COLON RESECTION: SHX5231

## 2020-11-11 LAB — CBC
HCT: 34.1 % — ABNORMAL LOW (ref 36.0–46.0)
Hemoglobin: 11.7 g/dL — ABNORMAL LOW (ref 12.0–15.0)
MCH: 33.5 pg (ref 26.0–34.0)
MCHC: 34.3 g/dL (ref 30.0–36.0)
MCV: 97.7 fL (ref 80.0–100.0)
Platelets: 240 10*3/uL (ref 150–400)
RBC: 3.49 MIL/uL — ABNORMAL LOW (ref 3.87–5.11)
RDW: 12.4 % (ref 11.5–15.5)
WBC: 12.9 10*3/uL — ABNORMAL HIGH (ref 4.0–10.5)
nRBC: 0 % (ref 0.0–0.2)

## 2020-11-11 LAB — ABO/RH: ABO/RH(D): A POS

## 2020-11-11 LAB — CREATININE, SERUM
Creatinine, Ser: 0.87 mg/dL (ref 0.44–1.00)
GFR, Estimated: >60 mL/min (ref >60)

## 2020-11-11 SURGERY — COLECTOMY, PARTIAL, ROBOT-ASSISTED, LAPAROSCOPIC
Anesthesia: General | Site: Abdomen

## 2020-11-11 MED ORDER — PROPOFOL 10 MG/ML IV BOLUS
INTRAVENOUS | Status: AC
  Filled 2020-11-11: qty 20

## 2020-11-11 MED ORDER — MIDAZOLAM HCL 2 MG/2ML IJ SOLN
INTRAMUSCULAR | Status: DC | PRN
  Administered 2020-11-11: 2 mg via INTRAVENOUS

## 2020-11-11 MED ORDER — ACETAMINOPHEN 325 MG PO TABS
650.0000 mg | ORAL_TABLET | Freq: Four times a day (QID) | ORAL | Status: DC | PRN
  Administered 2020-11-11 – 2020-11-13 (×3): 650 mg via ORAL
  Filled 2020-11-11 (×3): qty 2

## 2020-11-11 MED ORDER — METOPROLOL TARTRATE 25 MG PO TABS
25.0000 mg | ORAL_TABLET | Freq: Two times a day (BID) | ORAL | Status: DC
  Administered 2020-11-11 – 2020-11-14 (×6): 25 mg via ORAL
  Filled 2020-11-11 (×6): qty 1

## 2020-11-11 MED ORDER — FENTANYL CITRATE (PF) 250 MCG/5ML IJ SOLN
INTRAMUSCULAR | Status: AC
  Filled 2020-11-11: qty 5

## 2020-11-11 MED ORDER — ROCURONIUM BROMIDE 100 MG/10ML IV SOLN
INTRAVENOUS | Status: DC | PRN
  Administered 2020-11-11: 60 mg via INTRAVENOUS
  Administered 2020-11-11 (×2): 20 mg via INTRAVENOUS
  Administered 2020-11-11 (×2): 30 mg via INTRAVENOUS

## 2020-11-11 MED ORDER — ORAL CARE MOUTH RINSE: 15.0000 mL | Freq: Once | OROMUCOSAL | Status: AC

## 2020-11-11 MED ORDER — TRAZODONE HCL 50 MG PO TABS
75.0000 mg | ORAL_TABLET | Freq: Every day | ORAL | Status: DC
  Administered 2020-11-11 – 2020-11-13 (×3): 75 mg via ORAL
  Filled 2020-11-11 (×3): qty 2

## 2020-11-11 MED ORDER — BUSPIRONE HCL 10 MG PO TABS
10.0000 mg | ORAL_TABLET | Freq: Two times a day (BID) | ORAL | Status: DC
  Administered 2020-11-11 – 2020-11-14 (×6): 10 mg via ORAL
  Filled 2020-11-11 (×7): qty 1

## 2020-11-11 MED ORDER — PROPOFOL 10 MG/ML IV BOLUS
INTRAVENOUS | Status: DC | PRN
  Administered 2020-11-11: 150 mg via INTRAVENOUS

## 2020-11-11 MED ORDER — EPHEDRINE 5 MG/ML INJ
INTRAVENOUS | Status: AC
  Filled 2020-11-11: qty 5

## 2020-11-11 MED ORDER — BUPIVACAINE LIPOSOME 1.3 % IJ SUSP
INTRAMUSCULAR | Status: AC
  Filled 2020-11-11: qty 20

## 2020-11-11 MED ORDER — SODIUM CHLORIDE 0.9 % IV SOLN: INTRAVENOUS | Status: DC

## 2020-11-11 MED ORDER — FAMOTIDINE 20 MG PO TABS
ORAL_TABLET | ORAL | Status: AC
  Administered 2020-11-11: 20 mg via ORAL
  Filled 2020-11-11: qty 1

## 2020-11-11 MED ORDER — APREPITANT 40 MG PO CAPS
ORAL_CAPSULE | ORAL | Status: AC
  Administered 2020-11-11: 40 mg via ORAL
  Filled 2020-11-11: qty 1

## 2020-11-11 MED ORDER — MENTHOL (TOPICAL ANALGESIC) 5 % EX PTCH: MEDICATED_PATCH | Freq: Every day | CUTANEOUS | Status: DC | PRN

## 2020-11-11 MED ORDER — TRAMADOL HCL 50 MG PO TABS
ORAL_TABLET | ORAL | Status: AC
  Administered 2020-11-11: 50 mg via ORAL
  Filled 2020-11-11: qty 1

## 2020-11-11 MED ORDER — MENTHOL 3 MG MT LOZG
1.0000 | LOZENGE | OROMUCOSAL | Status: DC | PRN
  Filled 2020-11-11 (×2): qty 9

## 2020-11-11 MED ORDER — ACETAMINOPHEN 500 MG PO TABS: 1000.0000 mg | ORAL_TABLET | ORAL | Status: AC

## 2020-11-11 MED ORDER — CHLORHEXIDINE GLUCONATE 0.12 % MT SOLN: 15.0000 mL | Freq: Once | OROMUCOSAL | Status: AC

## 2020-11-11 MED ORDER — MELOXICAM 7.5 MG PO TABS
15.0000 mg | ORAL_TABLET | Freq: Every day | ORAL | Status: DC
  Administered 2020-11-12 – 2020-11-14 (×3): 15 mg via ORAL
  Filled 2020-11-11 (×3): qty 2

## 2020-11-11 MED ORDER — KETOROLAC TROMETHAMINE 30 MG/ML IJ SOLN
INTRAMUSCULAR | Status: DC | PRN
  Administered 2020-11-11: 30 mg via INTRAVENOUS

## 2020-11-11 MED ORDER — KETOROLAC TROMETHAMINE 30 MG/ML IJ SOLN
INTRAMUSCULAR | Status: AC
  Filled 2020-11-11: qty 1

## 2020-11-11 MED ORDER — ACETAMINOPHEN 500 MG PO TABS
ORAL_TABLET | ORAL | Status: AC
  Administered 2020-11-11: 1000 mg via ORAL
  Filled 2020-11-11: qty 2

## 2020-11-11 MED ORDER — MUSCLE RUB 10-15 % EX CREA
TOPICAL_CREAM | CUTANEOUS | Status: DC | PRN
  Filled 2020-11-11: qty 85

## 2020-11-11 MED ORDER — LOSARTAN POTASSIUM 50 MG PO TABS
100.0000 mg | ORAL_TABLET | Freq: Every day | ORAL | Status: DC
  Administered 2020-11-12 – 2020-11-14 (×3): 100 mg via ORAL
  Filled 2020-11-11 (×3): qty 2

## 2020-11-11 MED ORDER — IBUPROFEN 400 MG PO TABS
800.0000 mg | ORAL_TABLET | Freq: Four times a day (QID) | ORAL | Status: DC | PRN
  Administered 2020-11-12: 800 mg via ORAL
  Filled 2020-11-11: qty 2

## 2020-11-11 MED ORDER — ONDANSETRON HCL 4 MG/2ML IJ SOLN
INTRAMUSCULAR | Status: AC
  Filled 2020-11-11: qty 2

## 2020-11-11 MED ORDER — ENOXAPARIN SODIUM 40 MG/0.4ML IJ SOSY
40.0000 mg | PREFILLED_SYRINGE | INTRAMUSCULAR | Status: DC
  Administered 2020-11-12 – 2020-11-14 (×3): 40 mg via SUBCUTANEOUS
  Filled 2020-11-11 (×3): qty 0.4

## 2020-11-11 MED ORDER — FENTANYL CITRATE (PF) 100 MCG/2ML IJ SOLN
INTRAMUSCULAR | Status: DC | PRN
  Administered 2020-11-11: 50 ug via INTRAVENOUS
  Administered 2020-11-11: 100 ug via INTRAVENOUS

## 2020-11-11 MED ORDER — DEXAMETHASONE SODIUM PHOSPHATE 10 MG/ML IJ SOLN
INTRAMUSCULAR | Status: AC
  Filled 2020-11-11: qty 1

## 2020-11-11 MED ORDER — TRAMADOL HCL 50 MG PO TABS
50.0000 mg | ORAL_TABLET | Freq: Four times a day (QID) | ORAL | Status: DC | PRN
  Administered 2020-11-12 (×2): 50 mg via ORAL
  Filled 2020-11-11 (×2): qty 1

## 2020-11-11 MED ORDER — SUGAMMADEX SODIUM 200 MG/2ML IV SOLN
INTRAVENOUS | Status: DC | PRN
  Administered 2020-11-11: 150 mg via INTRAVENOUS

## 2020-11-11 MED ORDER — CITALOPRAM HYDROBROMIDE 20 MG PO TABS
40.0000 mg | ORAL_TABLET | Freq: Every day | ORAL | Status: DC
  Administered 2020-11-12 – 2020-11-14 (×3): 40 mg via ORAL
  Filled 2020-11-11 (×3): qty 2

## 2020-11-11 MED ORDER — SODIUM CHLORIDE 0.9 % IV SOLN
INTRAVENOUS | Status: AC
  Filled 2020-11-11: qty 2

## 2020-11-11 MED ORDER — LIDOCAINE HCL (PF) 2 % IJ SOLN
INTRAMUSCULAR | Status: AC
  Filled 2020-11-11: qty 20

## 2020-11-11 MED ORDER — LIDOCAINE HCL (CARDIAC) PF 100 MG/5ML IV SOSY
PREFILLED_SYRINGE | INTRAVENOUS | Status: DC | PRN
  Administered 2020-11-11: 100 mg via INTRAVENOUS

## 2020-11-11 MED ORDER — MIDAZOLAM HCL 2 MG/2ML IJ SOLN
INTRAMUSCULAR | Status: AC
  Filled 2020-11-11: qty 2

## 2020-11-11 MED ORDER — BUPIVACAINE HCL (PF) 0.5 % IJ SOLN
INTRAMUSCULAR | Status: AC
  Filled 2020-11-11: qty 30

## 2020-11-11 MED ORDER — ONDANSETRON HCL 4 MG/2ML IJ SOLN
INTRAMUSCULAR | Status: DC | PRN
  Administered 2020-11-11: 4 mg via INTRAVENOUS

## 2020-11-11 MED ORDER — LACTATED RINGERS IV SOLN: INTRAVENOUS | Status: DC | PRN

## 2020-11-11 MED ORDER — PHENYLEPHRINE HCL (PRESSORS) 10 MG/ML IV SOLN
INTRAVENOUS | Status: AC
  Filled 2020-11-11: qty 1

## 2020-11-11 MED ORDER — FAMOTIDINE 20 MG PO TABS: 20.0000 mg | ORAL_TABLET | Freq: Once | ORAL | Status: AC

## 2020-11-11 MED ORDER — GLYCOPYRROLATE 0.2 MG/ML IJ SOLN
INTRAMUSCULAR | Status: DC | PRN
  Administered 2020-11-11: .2 mg via INTRAVENOUS

## 2020-11-11 MED ORDER — CHLORHEXIDINE GLUCONATE CLOTH 2 % EX PADS: 6.0000 | MEDICATED_PAD | Freq: Once | CUTANEOUS | Status: DC

## 2020-11-11 MED ORDER — ONDANSETRON HCL 4 MG/2ML IJ SOLN
4.0000 mg | Freq: Four times a day (QID) | INTRAMUSCULAR | Status: DC | PRN
  Filled 2020-11-11: qty 2

## 2020-11-11 MED ORDER — DEXAMETHASONE SODIUM PHOSPHATE 10 MG/ML IJ SOLN
INTRAMUSCULAR | Status: DC | PRN
  Administered 2020-11-11: 10 mg via INTRAVENOUS

## 2020-11-11 MED ORDER — CHLORHEXIDINE GLUCONATE CLOTH 2 % EX PADS
6.0000 | MEDICATED_PAD | Freq: Every day | CUTANEOUS | Status: DC
  Administered 2020-11-12: 6 via TOPICAL

## 2020-11-11 MED ORDER — BUPIVACAINE HCL (PF) 0.5 % IJ SOLN
INTRAMUSCULAR | Status: DC | PRN
  Administered 2020-11-11: 30 mL

## 2020-11-11 MED ORDER — INDOCYANINE GREEN 25 MG IV SOLR
INTRAVENOUS | Status: DC | PRN
  Administered 2020-11-11: 5 mg via INTRAVENOUS

## 2020-11-11 MED ORDER — GLYCOPYRROLATE 0.2 MG/ML IJ SOLN
INTRAMUSCULAR | Status: AC
  Filled 2020-11-11: qty 1

## 2020-11-11 MED ORDER — LACTATED RINGERS IV SOLN: INTRAVENOUS | Status: DC

## 2020-11-11 MED ORDER — KETAMINE HCL 50 MG/ML IJ SOLN
INTRAMUSCULAR | Status: AC
  Filled 2020-11-11: qty 1

## 2020-11-11 MED ORDER — GABAPENTIN 300 MG PO CAPS
300.0000 mg | ORAL_CAPSULE | Freq: Every evening | ORAL | Status: DC
  Administered 2020-11-11 – 2020-11-13 (×3): 300 mg via ORAL
  Filled 2020-11-11 (×3): qty 1

## 2020-11-11 MED ORDER — ROCURONIUM BROMIDE 10 MG/ML (PF) SYRINGE
PREFILLED_SYRINGE | INTRAVENOUS | Status: AC
  Filled 2020-11-11: qty 20

## 2020-11-11 MED ORDER — FENTANYL CITRATE (PF) 100 MCG/2ML IJ SOLN
25.0000 ug | INTRAMUSCULAR | Status: DC | PRN
  Administered 2020-11-11 (×3): 25 ug via INTRAVENOUS

## 2020-11-11 MED ORDER — ALPRAZOLAM 0.25 MG PO TABS
0.2500 mg | ORAL_TABLET | Freq: Two times a day (BID) | ORAL | Status: DC | PRN
  Administered 2020-11-11 – 2020-11-13 (×3): 0.25 mg via ORAL
  Filled 2020-11-11 (×3): qty 1

## 2020-11-11 MED ORDER — PROMETHAZINE HCL 25 MG/ML IJ SOLN: 6.2500 mg | INTRAMUSCULAR | Status: DC | PRN

## 2020-11-11 MED ORDER — SODIUM CHLORIDE 0.9 % IV SOLN
2.0000 g | INTRAVENOUS | Status: AC
  Administered 2020-11-11: 2 g via INTRAVENOUS

## 2020-11-11 MED ORDER — EPHEDRINE SULFATE 50 MG/ML IJ SOLN
INTRAMUSCULAR | Status: DC | PRN
  Administered 2020-11-11: 10 mg via INTRAVENOUS
  Administered 2020-11-11: 5 mg via INTRAVENOUS
  Administered 2020-11-11 (×2): 15 mg via INTRAVENOUS
  Administered 2020-11-11: 10 mg via INTRAVENOUS
  Administered 2020-11-11 (×2): 5 mg via INTRAVENOUS

## 2020-11-11 MED ORDER — FENTANYL CITRATE (PF) 100 MCG/2ML IJ SOLN
INTRAMUSCULAR | Status: AC
  Administered 2020-11-11: 25 ug via INTRAVENOUS
  Filled 2020-11-11: qty 2

## 2020-11-11 MED ORDER — KETAMINE HCL 10 MG/ML IJ SOLN
INTRAMUSCULAR | Status: DC | PRN
  Administered 2020-11-11 (×3): 20 mg via INTRAVENOUS
  Administered 2020-11-11: 40 mg via INTRAVENOUS

## 2020-11-11 MED ORDER — SODIUM CHLORIDE 0.9 % IV SOLN
INTRAVENOUS | Status: DC | PRN
  Administered 2020-11-11: 50 mL

## 2020-11-11 MED ORDER — LIDOCAINE HCL (PF) 2 % IJ SOLN
INTRAMUSCULAR | Status: DC | PRN
Start: 2020-11-11 — End: 2020-11-11
  Administered 2020-11-11: 1.5 mg/kg/h via INTRADERMAL

## 2020-11-11 MED ORDER — CHLORHEXIDINE GLUCONATE 0.12 % MT SOLN
OROMUCOSAL | Status: AC
  Administered 2020-11-11: 15 mL via OROMUCOSAL
  Filled 2020-11-11: qty 15

## 2020-11-11 MED ORDER — LIDOCAINE HCL (PF) 2 % IJ SOLN
INTRAMUSCULAR | Status: AC
  Filled 2020-11-11: qty 5

## 2020-11-11 MED ORDER — APREPITANT 40 MG PO CAPS: 40.0000 mg | ORAL_CAPSULE | Freq: Once | ORAL | Status: AC

## 2020-11-11 MED ORDER — ONDANSETRON 4 MG PO TBDP: 4.0000 mg | ORAL_TABLET | Freq: Four times a day (QID) | ORAL | Status: DC | PRN

## 2020-11-11 SURGICAL SUPPLY — 87 items
BLADE CLIPPER SURG (BLADE) ×2 IMPLANT
BLADE SURG SZ10 CARB STEEL (BLADE) ×2 IMPLANT
BLADE SURG SZ11 CARB STEEL (BLADE) ×2 IMPLANT
CANISTER SUCT 1200ML W/VALVE (MISCELLANEOUS) IMPLANT
CANNULA REDUC XI 12-8 STAPL (CANNULA) ×1
CANNULA REDUCER 12-8 DVNC XI (CANNULA) ×1 IMPLANT
CHLORAPREP W/TINT 26 (MISCELLANEOUS) ×2 IMPLANT
COVER TIP SHEARS 8 DVNC (MISCELLANEOUS) ×1 IMPLANT
COVER TIP SHEARS 8MM DA VINCI (MISCELLANEOUS) ×1
DEFOGGER SCOPE WARMER CLEARIFY (MISCELLANEOUS) ×2 IMPLANT
DERMABOND ADVANCED (GAUZE/BANDAGES/DRESSINGS) ×1
DERMABOND ADVANCED .7 DNX12 (GAUZE/BANDAGES/DRESSINGS) ×1 IMPLANT
DRAPE ARM DVNC X/XI (DISPOSABLE) ×4 IMPLANT
DRAPE COLUMN DVNC XI (DISPOSABLE) ×1 IMPLANT
DRAPE DA VINCI XI ARM (DISPOSABLE) ×4
DRAPE DA VINCI XI COLUMN (DISPOSABLE) ×1
DRAPE LEGGINS SURG 28X43 STRL (DRAPES) ×4 IMPLANT
DRAPE UNDER BUTTOCK W/FLU (DRAPES) ×2 IMPLANT
DRSG OPSITE POSTOP 4X10 (GAUZE/BANDAGES/DRESSINGS) IMPLANT
DRSG OPSITE POSTOP 4X8 (GAUZE/BANDAGES/DRESSINGS) IMPLANT
ELECT CAUTERY BLADE 6.4 (BLADE) IMPLANT
ELECT REM PT RETURN 9FT ADLT (ELECTROSURGICAL) ×2
ELECTRODE REM PT RTRN 9FT ADLT (ELECTROSURGICAL) ×1 IMPLANT
GAUZE 4X4 16PLY ~~LOC~~+RFID DBL (SPONGE) ×2 IMPLANT
GLOVE SURG SYN 6.5 ES PF (GLOVE) ×6 IMPLANT
GLOVE SURG UNDER POLY LF SZ7 (GLOVE) ×6 IMPLANT
GOWN STRL REUS W/ TWL LRG LVL3 (GOWN DISPOSABLE) ×6 IMPLANT
GOWN STRL REUS W/TWL LRG LVL3 (GOWN DISPOSABLE) ×6
GRASPER SUT TROCAR 14GX15 (MISCELLANEOUS) IMPLANT
HANDLE YANKAUER SUCT BULB TIP (MISCELLANEOUS) ×2 IMPLANT
IRRIGATION STRYKERFLOW (MISCELLANEOUS) ×1 IMPLANT
IRRIGATOR STRYKERFLOW (MISCELLANEOUS) ×2
IV NS 1000ML (IV SOLUTION) ×1
IV NS 1000ML BAXH (IV SOLUTION) ×1 IMPLANT
KIT IMAGING PINPOINTPAQ (MISCELLANEOUS) ×2 IMPLANT
KIT PINK PAD W/HEAD ARE REST (MISCELLANEOUS) ×2
KIT PINK PAD W/HEAD ARM REST (MISCELLANEOUS) ×1 IMPLANT
LABEL OR SOLS (LABEL) IMPLANT
MANIFOLD NEPTUNE II (INSTRUMENTS) ×2 IMPLANT
NEEDLE HYPO 22GX1.5 SAFETY (NEEDLE) ×2 IMPLANT
NEEDLE INSUFFLATION 14GA 120MM (NEEDLE) IMPLANT
OBTURATOR OPTICAL STANDARD 8MM (TROCAR) ×1
OBTURATOR OPTICAL STND 8 DVNC (TROCAR) ×1
OBTURATOR OPTICALSTD 8 DVNC (TROCAR) ×1 IMPLANT
PACK COLON CLEAN CLOSURE (MISCELLANEOUS) ×2 IMPLANT
PACK LAP CHOLECYSTECTOMY (MISCELLANEOUS) ×2 IMPLANT
PENCIL ELECTRO HAND CTR (MISCELLANEOUS) ×2 IMPLANT
PORT ACCESS TROCAR AIRSEAL 5 (TROCAR) ×2 IMPLANT
RELOAD STAPLER 3.5X45 BLU DVNC (STAPLE) IMPLANT
RELOAD STAPLER 3.5X60 BLU DVNC (STAPLE) ×2 IMPLANT
SEAL CANN UNIV 5-8 DVNC XI (MISCELLANEOUS) ×3 IMPLANT
SEAL XI 5MM-8MM UNIVERSAL (MISCELLANEOUS) ×3
SEALER VESSEL DA VINCI XI (MISCELLANEOUS) ×1
SEALER VESSEL EXT DVNC XI (MISCELLANEOUS) ×1 IMPLANT
SET BI-LUMEN FLTR TB AIRSEAL (TUBING) ×2 IMPLANT
SLEEVE ENDOPATH XCEL 5M (ENDOMECHANICALS) IMPLANT
SOL PREP PVP 2OZ (MISCELLANEOUS) ×2
SOLUTION ELECTROLUBE (MISCELLANEOUS) ×2 IMPLANT
SOLUTION PREP PVP 2OZ (MISCELLANEOUS) ×1 IMPLANT
SPONGE T-LAP 18X18 ~~LOC~~+RFID (SPONGE) ×2 IMPLANT
STAPLER 45 DA VINCI SURE FORM (STAPLE)
STAPLER 45 SUREFORM DVNC (STAPLE) IMPLANT
STAPLER 60 DA VINCI SURE FORM (STAPLE) ×1
STAPLER 60 SUREFORM DVNC (STAPLE) ×1 IMPLANT
STAPLER CANNULA SEAL DVNC XI (STAPLE) ×1 IMPLANT
STAPLER CANNULA SEAL XI (STAPLE) ×1
STAPLER CIRCULAR MANUAL XL 29 (STAPLE) ×2 IMPLANT
STAPLER RELOAD 3.5X45 BLU DVNC (STAPLE)
STAPLER RELOAD 3.5X45 BLUE (STAPLE)
STAPLER RELOAD 3.5X60 BLU DVNC (STAPLE) ×2
STAPLER RELOAD 3.5X60 BLUE (STAPLE) ×2
STAPLER SKIN PROX 35W (STAPLE) IMPLANT
SURGILUBE 2OZ TUBE FLIPTOP (MISCELLANEOUS) ×2 IMPLANT
SUT DVC VLOC 3-0 CL 6 P-12 (SUTURE) ×2 IMPLANT
SUT MNCRL AB 4-0 PS2 18 (SUTURE) ×2 IMPLANT
SUT PDS AB 1 CT1 36 (SUTURE) ×4 IMPLANT
SUT SILK 3 0 SH 30 (SUTURE) ×2 IMPLANT
SUT VIC AB 3-0 SH 27 (SUTURE) ×2
SUT VIC AB 3-0 SH 27X BRD (SUTURE) ×2 IMPLANT
SUT VICRYL 0 AB UR-6 (SUTURE) IMPLANT
SYR 30ML LL (SYRINGE) ×4 IMPLANT
SYS LAPSCP GELPORT 120MM (MISCELLANEOUS)
SYSTEM LAPSCP GELPORT 120MM (MISCELLANEOUS) IMPLANT
SYSTEM WECK SHIELD CLOSURE (TROCAR) IMPLANT
TRAY FOLEY MTR SLVR 16FR STAT (SET/KITS/TRAYS/PACK) ×2 IMPLANT
TROCAR XCEL NON-BLD 5MMX100MML (ENDOMECHANICALS) ×4 IMPLANT
TUBING EVAC SMOKE HEATED PNEUM (TUBING) ×2 IMPLANT

## 2020-11-11 NOTE — H&P (Signed)
Subjective:   CC: Diverticulitis [K57.92]  HPI: Allison Alvarez is a 60 y.o. female who was referred by [facility] for evaluation of above. Chronic requiring antibiotics to resolve most of pain, but never resolves. Pain returns as soon as antibiotics discontinued. Sharp, located in [place]. Associated with fatigue, exacerbated by nothing specific.  Last colonoscopy in 07/2020, benign polyp, asked to return in 18yrs.  Past Medical History: has a past medical history of Anxiety, Arthritis, Depression, Diverticulitis, Fibromyalgia, and Hypertension.  Past Surgical History: has a past surgical history that includes Colonoscopy (07/12/2020); egd (07/12/2020); Colonoscopy (07/2016); Abdominal hysterectomy; appendectomy; AV fistula insertion w/ RF magnetic guidance; repair bladder exstrophy; and cholecystectomy.  Family History: family history includes Breast cancer (age of onset: 47) in her paternal aunt; Colon cancer (age of onset: 18) in her maternal grandmother.  Social History: reports that she quit smoking about 8 months ago. Her smoking use included cigarettes. She has a 48.00 pack-year smoking history. She has never used smokeless tobacco. She reports current alcohol use. She reports that she does not use drugs.  Current Medications: has a current medication list which includes the following prescription(s): alprazolam, buspirone, citalopram, erythromycin base, gabapentin, losartan, meloxicam, metoprolol tartrate, metronidazole, and trazodone.  Allergies:  Allergies  Allergen Reactions   Morphine Itching   Hydromorphone (Bulk) Itching   ROS:  A 15 point review of systems was performed and pertinent positives and negatives noted in HPI  Objective:    LMP 04/13/2006 (Approximate)   Constitutional : alert, appears stated age, cooperative and no distress  Lymphatics/Throat: no asymmetry, masses, or scars  Respiratory: clear to auscultation bilaterally  Cardiovascular: regular rate and rhythm   Gastrointestinal: soft, non-tender; bowel sounds normal; no masses, no organomegaly.  Musculoskeletal: Steady gait and movement  Skin: Cool and moist  Psychiatric: Normal affect, non-agitated, not confused    LABS:  N/a  RADS: n/a  Assessment:    Diverticulitis [K57.92]  Plan:    Chronic, recurrent with long term antibiotic use. No other obvious cause and history consistent, so will proceed with surgical excision.  The risk of laparoscopic colon resection surgery includes, but not limited to, recurrence, bleeding, chronic pain, post-op infxn, post-op SBO or ileus, hernias, resection of bowel, re-anastamosis, possible ostomy placement and need for re-operation to address said risks. The risks of general anesthetic, if used, includes MI, CVA, sudden death or even reaction to anesthetic medications also discussed. Alternatives include continued observation. Benefits include possible symptom relief, preventing further decline in health and possible death.  Typical post-op recovery time of additional days in hospital for observation afterwards also discussed.  Prep ordered. Will proceed with ERAS protocol as well. Pending medical clearance and workup as noted above.   The patient verbalized understanding and all questions were answered to the patient's satisfaction.  Electronically signed by Sung Amabile, DO at 10/08/2020 10:23 AM EDT

## 2020-11-11 NOTE — Anesthesia Procedure Notes (Signed)
Procedure Name: Intubation Date/Time: 11/11/2020 7:47 AM Performed by: Katherine Basset, CRNA Pre-anesthesia Checklist: Patient identified, Emergency Drugs available, Suction available and Patient being monitored Patient Re-evaluated:Patient Re-evaluated prior to induction Oxygen Delivery Method: Circle system utilized Preoxygenation: Pre-oxygenation with 100% oxygen Induction Type: IV induction Ventilation: Mask ventilation without difficulty Laryngoscope Size: Miller and 2 Grade View: Grade I Tube type: Oral Tube size: 7.5 mm Number of attempts: 1 Airway Equipment and Method: Stylet and Oral airway Placement Confirmation: ETT inserted through vocal cords under direct vision, positive ETCO2 and breath sounds checked- equal and bilateral Secured at: 22 cm Tube secured with: Tape Dental Injury: Teeth and Oropharynx as per pre-operative assessment

## 2020-11-11 NOTE — Transfer of Care (Signed)
Immediate Anesthesia Transfer of Care Note  Patient: Allison Alvarez  Procedure(s) Performed: XI ROBOT ASSISTED LAPAROSCOPIC PARTIAL COLECTOMY (Abdomen)  Patient Location: PACU  Anesthesia Type:General  Level of Consciousness: awake, alert  and oriented  Airway & Oxygen Therapy: Patient Spontanous Breathing and Patient connected to face mask oxygen  Post-op Assessment: Report given to RN, Post -op Vital signs reviewed and stable and Patient moving all extremities  Post vital signs: Reviewed and stable  Last Vitals:  Vitals Value Taken Time  BP 154/90 11/11/20 1247  Temp 36.2 C 11/11/20 1247  Pulse 81 11/11/20 1252  Resp 17 11/11/20 1252  SpO2 100 % 11/11/20 1252    Last Pain:  Vitals:   11/11/20 0629  TempSrc: Oral  PainSc: 0-No pain         Complications: No notable events documented.

## 2020-11-11 NOTE — Op Note (Signed)
Preoperative diagnosis: diverticulitis Postoperative diagnosis: Same  Procedure: Robotic assisted laparoscopic sigmoidectomy, splenic flexure takedown   Anesthesia: GETA   Surgeon: Sung Amabile Assistant: Hazle Quant for exposure and bedside assist    Wound Classification: clean contaminated   Specimen: Sigmoid colon   Complications: None   Estimated Blood Loss: 30 mL  Indications: Patient presented with above.  Please see H&P for further details.     FIndings: 1.  Infection present in the abdomen 2.  Successful EEA anastomosis with no air leak  3.  Normal anatomy 4.  Adequate hemostasis.    Description of procedure: The patient was placed on the operating table in the low lithotomy position, both arms tucked. General anesthesia was induced.  Foley placed. A time-out was completed verifying correct patient, procedure, site, positioning, and implant(s) and/or special equipment prior to beginning this procedure. The abdomen was prepped and draped in the usual sterile fashion.    Right lower quadrant incision made and dissection carried down into the abdominal cavity.  Laparoscopic GelPort then placed through this incision and insufflation up to 15 mmHg started.  12 mm port was placed through the GelPort and through this the camera was inserted in no injury to the surrounding organs were noted.  Under direct visualization 3 additional millimeter trocar trochars were placed along the right side of the patient and an additional air seal assistant port was placed between the most cephalad and periumbilical port.  The table was placed in the Trendelenburg, right side down position. Xi robotic platform was then brought to the operative field and docked. additional 6mm port placed on left side, after docking for better access for asisstant, since the airseal port difficult to access after docking.    Inspection of the sigmoid colon, noted chronic diverticulitis with dense adhesions along  lateral aspect.  Falciform taken down for better access with subxyphoid port.   Lateral attachments were taken down using sharp dissection.  This was extending around the splenic flexure and the splenocolic ligament transected as well, to allow adequate reach of the descending colon for new colonic anastomosis.  Area proximal to the thickened colon wall was chosen for anvil insertion site.  29 mm EEA stapler anvil was placed through the GelPort and grasped with force bipolar at its base.  A longitudinal incision was made in the colon with scissors distal to the previously marked site to accommodate the anvil.  The tip of the anvil was then placed at the point of the previous marked site and a small hole was made to accommodate the anvil tip through the colon wall.  3-0 V lock was then used to close the initial hole made to insert the anvil within the colon and then a 60 mm blue load stapler was used to transect immediately proximal to the closed hole.  The mesentery was then transected using vessel sealer down to the rectum.  Another 60 mm blue load stapler was then used to transect the colon at this point and the specimen was removed through the right lower quadrant GelPort site.  ICG was infused and adequate circulation was noted at both staple lines.   EEA stapler placed through the rectal stump and guided to the distal staple line and a end-to-side anastomosis was created, after confirming no twisting of the proximal mesentery and no tension noted on the staple line.  Two donuts intact.  Saline was infused into the pelvis, and a air leak test was performed and anastomosis did not show any  leaks.  All saline was then suctioned out, anastomosis looked viable, and hemostasis was noted throughout the abdomen.    Clean closure protocol initiated.  Exparel infused at TAP block, and lower quadrant gelport site closed with 1 PDS x2.  3-0 Vicryl used to approximate the subcutaneous tissue prior to closing all skin  sites with 4-0 Monocryl in running subcuticular fashion.  Incisions then dressed with Dermabond.   The patient tolerated the procedure well, awakened from anesthesia and was taken to the postanesthesia care unit in satisfactory condition with Foley in place.  Sponge count correct at end of procedure.

## 2020-11-11 NOTE — Anesthesia Preprocedure Evaluation (Signed)
Anesthesia Evaluation  Patient identified by MRN, date of birth, ID band Patient awake    Reviewed: Allergy & Precautions, H&P , NPO status , Patient's Chart, lab work & pertinent test results, reviewed documented beta blocker date and time   History of Anesthesia Complications (+) PONV and history of anesthetic complications  Airway Mallampati: III  TM Distance: >3 FB Neck ROM: full    Dental  (+) Teeth Intact, Caps, Dental Advidsory Given   Pulmonary neg pulmonary ROS, former smoker,    Pulmonary exam normal breath sounds clear to auscultation       Cardiovascular Exercise Tolerance: Good hypertension, (-) angina(-) Past MI and (-) Cardiac Stents Normal cardiovascular exam(-) dysrhythmias (-) Valvular Problems/Murmurs Rhythm:regular Rate:Normal     Neuro/Psych neg Seizures PSYCHIATRIC DISORDERS Anxiety Depression  Neuromuscular disease (fibromyalgia)    GI/Hepatic negative GI ROS, Neg liver ROS,   Endo/Other  negative endocrine ROS  Renal/GU negative Renal ROS  negative genitourinary   Musculoskeletal   Abdominal   Peds  Hematology negative hematology ROS (+)   Anesthesia Other Findings Past Medical History: No date: Anxiety No date: Arthritis No date: Depression No date: Diverticulitis No date: Fibromyalgia No date: Hypertension No date: PONV (postoperative nausea and vomiting) No date: PVC (premature ventricular contraction)   Reproductive/Obstetrics negative OB ROS                             Anesthesia Physical Anesthesia Plan  ASA: 2  Anesthesia Plan: General   Post-op Pain Management:    Induction: Intravenous  PONV Risk Score and Plan: 4 or greater and Ondansetron, Dexamethasone, Aprepitant, Midazolam, Promethazine and Treatment may vary due to age or medical condition  Airway Management Planned: Oral ETT  Additional Equipment:   Intra-op Plan:   Post-operative  Plan: Extubation in OR  Informed Consent: I have reviewed the patients History and Physical, chart, labs and discussed the procedure including the risks, benefits and alternatives for the proposed anesthesia with the patient or authorized representative who has indicated his/her understanding and acceptance.     Dental Advisory Given  Plan Discussed with: Anesthesiologist, CRNA and Surgeon  Anesthesia Plan Comments:         Anesthesia Quick Evaluation

## 2020-11-11 NOTE — Interval H&P Note (Signed)
History and Physical Interval Note:  11/11/2020 7:09 AM  Allison Alvarez  has presented today for surgery, with the diagnosis of Clectomy Robotic.  The various methods of treatment have been discussed with the patient and family. After consideration of risks, benefits and other options for treatment, the patient has consented to  Procedure(s): XI ROBOT ASSISTED LAPAROSCOPIC PARTIAL COLECTOMY (N/A) as a surgical intervention.  The patient's history has been reviewed, patient examined, no change in status, stable for surgery.  I have reviewed the patient's chart and labs.  Questions were answered to the patient's satisfaction.     Alvah Gilder Tonna Boehringer

## 2020-11-12 LAB — CBC
HCT: 28.6 % — ABNORMAL LOW (ref 36.0–46.0)
Hemoglobin: 10.1 g/dL — ABNORMAL LOW (ref 12.0–15.0)
MCH: 34.7 pg — ABNORMAL HIGH (ref 26.0–34.0)
MCHC: 35.3 g/dL (ref 30.0–36.0)
MCV: 98.3 fL (ref 80.0–100.0)
Platelets: 210 10*3/uL (ref 150–400)
RBC: 2.91 MIL/uL — ABNORMAL LOW (ref 3.87–5.11)
RDW: 12.7 % (ref 11.5–15.5)
WBC: 10.8 10*3/uL — ABNORMAL HIGH (ref 4.0–10.5)
nRBC: 0 % (ref 0.0–0.2)

## 2020-11-12 LAB — BASIC METABOLIC PANEL
Anion gap: 6 (ref 5–15)
BUN: 15 mg/dL (ref 6–20)
CO2: 26 mmol/L (ref 22–32)
Calcium: 8.5 mg/dL — ABNORMAL LOW (ref 8.9–10.3)
Chloride: 106 mmol/L (ref 98–111)
Creatinine, Ser: 0.83 mg/dL (ref 0.44–1.00)
GFR, Estimated: >60 mL/min (ref >60)
Glucose, Bld: 103 mg/dL — ABNORMAL HIGH (ref 70–99)
Potassium: 3.5 mmol/L (ref 3.5–5.1)
Sodium: 138 mmol/L (ref 135–145)

## 2020-11-12 NOTE — Anesthesia Postprocedure Evaluation (Signed)
Anesthesia Post Note  Patient: Allison Alvarez  Procedure(s) Performed: XI ROBOT ASSISTED LAPAROSCOPIC PARTIAL COLECTOMY (Abdomen)  Patient location during evaluation: PACU Anesthesia Type: General Level of consciousness: awake and alert Pain management: pain level controlled Vital Signs Assessment: post-procedure vital signs reviewed and stable Respiratory status: spontaneous breathing, nonlabored ventilation, respiratory function stable and patient connected to nasal cannula oxygen Cardiovascular status: blood pressure returned to baseline and stable Postop Assessment: no apparent nausea or vomiting Anesthetic complications: no   No notable events documented.   Last Vitals:  Vitals:   11/12/20 0439 11/12/20 0519  BP: 115/76 110/64  Pulse: 83 63  Resp: 20   Temp: 37.2 C 36.7 C  SpO2: 95% 97%    Last Pain:  Vitals:   11/12/20 0519  TempSrc: Oral  PainSc:                  Lenard Simmer

## 2020-11-12 NOTE — Progress Notes (Signed)
Subjective:  CC: Allison Alvarez is a 60 y.o. female  Hospital stay day 1, 1 Day Post-Op robotic lap sigmoid for diverticulitis.    HPI: No issues overnight.  Soreness around incisions.  Passing some flatus.  ROS:  General: Denies weight loss, weight gain, fatigue, fevers, chills, and night sweats. Heart: Denies chest pain, palpitations, racing heart, irregular heartbeat, leg pain or swelling, and decreased activity tolerance. Respiratory: Denies breathing difficulty, shortness of breath, wheezing, cough, and sputum. GI: Denies change in appetite, heartburn, nausea, vomiting, constipation, diarrhea, and blood in stool. GU: Denies difficulty urinating, pain with urinating, urgency, frequency, blood in urine.   Objective:   Temp:  [97.1 F (36.2 C)-99.1 F (37.3 C)] 98.1 F (36.7 C) (08/02 0519) Pulse Rate:  [63-94] 63 (08/02 0519) Resp:  [11-23] 20 (08/02 0439) BP: (110-154)/(64-90) 110/64 (08/02 0519) SpO2:  [95 %-100 %] 97 % (08/02 0519)     Height: 5\' 8"  (172.7 cm) Weight: 78.5 kg BMI (Calculated): 26.31   Intake/Output this shift:   Intake/Output Summary (Last 24 hours) at 11/12/2020 01/12/2021 Last data filed at 11/11/2020 2033 Gross per 24 hour  Intake 1900 ml  Output 340 ml  Net 1560 ml    Constitutional :  alert, cooperative, appears stated age, and no distress  Respiratory:  clear to auscultation bilaterally  Cardiovascular:  regular rate and rhythm  Gastrointestinal: Soft, no guarding, focal TTP around incision sites as expected .   Skin: Cool and moist. Incisions c/d/i  Psychiatric: Normal affect, non-agitated, not confused       LABS:  CMP Latest Ref Rng & Units 11/12/2020 11/11/2020 05/27/2017  Glucose 70 - 99 mg/dL 05/29/2017) - 960(A)  BUN 6 - 20 mg/dL 15 - 12  Creatinine 540(J - 1.00 mg/dL 8.11 9.14 7.82  Sodium 135 - 145 mmol/L 138 - 139  Potassium 3.5 - 5.1 mmol/L 3.5 - 3.8  Chloride 98 - 111 mmol/L 106 - 104  CO2 22 - 32 mmol/L 26 - 26  Calcium 8.9 - 10.3 mg/dL 9.56)  - 9.6  Total Protein 6.5 - 8.1 g/dL - - 8.2(H)  Total Bilirubin 0.3 - 1.2 mg/dL - - 0.8  Alkaline Phos 38 - 126 U/L - - 76  AST 15 - 41 U/L - - 37  ALT 14 - 54 U/L - - 22   CBC Latest Ref Rng & Units 11/12/2020 11/11/2020 07/08/2017  WBC 4.0 - 10.5 K/uL 10.8(H) 12.9(H) 8.7  Hemoglobin 12.0 - 15.0 g/dL 10.1(L) 11.7(L) 12.9  Hematocrit 36.0 - 46.0 % 28.6(L) 34.1(L) 37.2  Platelets 150 - 400 K/uL 210 240 253    RADS: N/a Assessment:  S/p robotic lap sigmoid for diverticulitis.  foley out, clears for today, ambulate halls.  disontinue iv when tolerating diet

## 2020-11-12 NOTE — Progress Notes (Signed)
Patient sitting up in bed at this time, no acute distress noted.  Family bedside.  Patient complaint of headache, previously given tylenol and tramadol for pain with minimal relief. Patient states she drinks an excessive amount of caffeine throughout the day, last drink on Sunday.  Patient able to tolerate clears and given caffeine as instructed by MD.  Patient has had low grade temp throughout the day, MD made aware.  Patient denies having any increased abdominal pain bedside cramping due to gas.  Patient had bowel movement previously in shift, blood noted to first stool, md made aware.  Will report off to oncoming nurse.

## 2020-11-13 ENCOUNTER — Encounter: Payer: Self-pay | Admitting: Surgery

## 2020-11-13 LAB — CBC
HCT: 30 % — ABNORMAL LOW (ref 36.0–46.0)
Hemoglobin: 10.3 g/dL — ABNORMAL LOW (ref 12.0–15.0)
MCH: 33.3 pg (ref 26.0–34.0)
MCHC: 34.3 g/dL (ref 30.0–36.0)
MCV: 97.1 fL (ref 80.0–100.0)
Platelets: 194 10*3/uL (ref 150–400)
RBC: 3.09 MIL/uL — ABNORMAL LOW (ref 3.87–5.11)
RDW: 12.9 % (ref 11.5–15.5)
WBC: 8.6 10*3/uL (ref 4.0–10.5)
nRBC: 0 % (ref 0.0–0.2)

## 2020-11-13 LAB — BASIC METABOLIC PANEL
Anion gap: 4 — ABNORMAL LOW (ref 5–15)
BUN: 9 mg/dL (ref 6–20)
CO2: 27 mmol/L (ref 22–32)
Calcium: 8.6 mg/dL — ABNORMAL LOW (ref 8.9–10.3)
Chloride: 108 mmol/L (ref 98–111)
Creatinine, Ser: 0.72 mg/dL (ref 0.44–1.00)
GFR, Estimated: >60 mL/min (ref >60)
Glucose, Bld: 115 mg/dL — ABNORMAL HIGH (ref 70–99)
Potassium: 3.7 mmol/L (ref 3.5–5.1)
Sodium: 139 mmol/L (ref 135–145)

## 2020-11-13 LAB — SURGICAL PATHOLOGY

## 2020-11-13 MED ORDER — SODIUM CHLORIDE 0.9 % IR SOLN
Status: DC | PRN
Start: 2020-11-11 — End: 2020-11-13
  Administered 2020-11-11: 1000 mL

## 2020-11-13 MED ORDER — BUTALBITAL-APAP-CAFFEINE 50-325-40 MG PO TABS
1.0000 | ORAL_TABLET | Freq: Four times a day (QID) | ORAL | Status: DC | PRN
  Administered 2020-11-13 (×2): 1 via ORAL
  Filled 2020-11-13 (×2): qty 1

## 2020-11-13 MED ORDER — KETOROLAC TROMETHAMINE 30 MG/ML IJ SOLN
30.0000 mg | Freq: Three times a day (TID) | INTRAMUSCULAR | Status: DC | PRN
  Administered 2020-11-13: 30 mg via INTRAVENOUS
  Filled 2020-11-13: qty 1

## 2020-11-13 MED ORDER — CYANOCOBALAMIN 1000 MCG/ML IJ SOLN
1000.0000 ug | Freq: Once | INTRAMUSCULAR | Status: AC
  Administered 2020-11-14: 1000 ug via INTRAMUSCULAR
  Filled 2020-11-13: qty 1

## 2020-11-13 NOTE — Progress Notes (Signed)
Subjective:  CC: Allison Alvarez is a 60 y.o. female  Hospital stay day 2, 2 Days Post-Op robotic lap sigmoid for diverticulitis.    HPI: Mild headache all day, also report of fever.  Soreness around incisions still present. Had some BMs, slightly bloody.  Anxious all day  ROS:  General: Denies weight loss, weight gain, fatigue, fevers, chills, and night sweats. Heart: Denies chest pain, palpitations, racing heart, irregular heartbeat, leg pain or swelling, and decreased activity tolerance. Respiratory: Denies breathing difficulty, shortness of breath, wheezing, cough, and sputum. GI: Denies change in appetite, heartburn, nausea, vomiting, constipation, diarrhea, and blood in stool. GU: Denies difficulty urinating, pain with urinating, urgency, frequency, blood in urine.   Objective:   Temp:  [98.1 F (36.7 C)-101.6 F (38.7 C)] 98.1 F (36.7 C) (08/03 0406) Pulse Rate:  [72-95] 72 (08/03 0406) Resp:  [18] 18 (08/03 0406) BP: (114-146)/(77-85) 125/77 (08/03 0406) SpO2:  [94 %-99 %] 95 % (08/03 0406)     Height: 5\' 8"  (172.7 cm) Weight: 78.5 kg BMI (Calculated): 26.31   Intake/Output this shift:   Intake/Output Summary (Last 24 hours) at 11/13/2020 0704 Last data filed at 11/12/2020 1600 Gross per 24 hour  Intake 1950.36 ml  Output --  Net 1950.36 ml    Constitutional :  alert, cooperative, appears stated age, and no distress  Respiratory:  clear to auscultation bilaterally  Cardiovascular:  regular rate and rhythm  Gastrointestinal: Soft, no guarding, focal TTP around incision sites as expected . Overall soreness still unchanged from previous, no distention  Skin: Cool and moist. Incisions c/d/i  Psychiatric: Normal affect, non-agitated, not confused       LABS:  CMP Latest Ref Rng & Units 11/13/2020 11/12/2020 11/11/2020  Glucose 70 - 99 mg/dL 01/11/2021) 979(G) -  BUN 6 - 20 mg/dL 9 15 -  Creatinine 921(J - 1.00 mg/dL 9.41 7.40 8.14  Sodium 135 - 145 mmol/L 139 138 -  Potassium 3.5  - 5.1 mmol/L 3.7 3.5 -  Chloride 98 - 111 mmol/L 108 106 -  CO2 22 - 32 mmol/L 27 26 -  Calcium 8.9 - 10.3 mg/dL 4.81) 8.5(U) -  Total Protein 6.5 - 8.1 g/dL - - -  Total Bilirubin 0.3 - 1.2 mg/dL - - -  Alkaline Phos 38 - 126 U/L - - -  AST 15 - 41 U/L - - -  ALT 14 - 54 U/L - - -   CBC Latest Ref Rng & Units 11/13/2020 11/12/2020 11/11/2020  WBC 4.0 - 10.5 K/uL 8.6 10.8(H) 12.9(H)  Hemoglobin 12.0 - 15.0 g/dL 10.3(L) 10.1(L) 11.7(L)  Hematocrit 36.0 - 46.0 % 30.0(L) 28.6(L) 34.1(L)  Platelets 150 - 400 K/uL 194 210 240    RADS: N/a Assessment:  S/p robotic lap sigmoid for diverticulitis.  Besides fever, no other major concerns.  Will advance diet as tolerated, IV toradol as needed for pain.  Fioricet for her headaches since resuming caffeine did not help much.  No clinical signs of overt infection but will keep monitoring for now.  Hopefully d/c by tomorrow am if no additional fevers and tolerates diet

## 2020-11-13 NOTE — Progress Notes (Signed)
Mobility Specialist - Progress Note   11/13/20 1500  Mobility  Activity Ambulated in hall  Level of Assistance Independent  Assistive Device None  Distance Ambulated (ft) 320 ft  Mobility Ambulated independently in hallway  Mobility Response Tolerated well  Mobility performed by Mobility specialist  $Mobility charge 1 Mobility    Pt ambulated in hallway independently. No LOB. Mild pain in abdomen, slight forward lean d/t discomfort. Mild SOB, O2 93% on RA. HR 99 bpm.    Filiberto Pinks Mobility Specialist 11/13/20, 4:09 PM

## 2020-11-14 MED ORDER — TRAMADOL HCL 50 MG PO TABS: 50.0000 mg | ORAL_TABLET | Freq: Four times a day (QID) | ORAL | 0 refills | Status: DC | PRN

## 2020-11-14 MED ORDER — DOCUSATE SODIUM 100 MG PO CAPS: 100.0000 mg | ORAL_CAPSULE | Freq: Two times a day (BID) | ORAL | 0 refills | Status: AC | PRN

## 2020-11-14 NOTE — Discharge Summary (Signed)
Physician Discharge Summary  Patient ID: Allison Alvarez MRN: [id number] DOB/AGE: 58/58/1962 58 y.o.  Admit date: 11/11/2020 Discharge date: 11/14/20  Admission Diagnoses: Diverticulitis  Discharge Diagnoses:  Same as above  Discharged Condition: good  Hospital Course: Admitted for above.  Underwent elective sigmoid resection robotic assisted laparoscopic.  Postop recovered as expected with gradual resumption of diet.  At the time of discharge, pain controlled, tolerating regular diet, and having bowel movements.  Consults: None  Discharge Exam: Blood pressure (!) 145/82, pulse 70, temperature 98.1 F (36.7 C), temperature source Oral, resp. rate 18, height 5\' 8"  (1.727 m), weight 78.5 kg, SpO2 97 %. General appearance: alert, cooperative, and no distress GI: Soft, no guarding, focal tenderness improving over suprapubic site.  Incisions clean dry and intact with minimal tenderness as expected  Disposition:     Allergies as of 11/14/2020       Reactions   Morphine Itching   Hydromorphone Itching        Medication List     TAKE these medications    acetaminophen 325 MG tablet Commonly known as: TYLENOL Take 650 mg by mouth every 6 (six) hours as needed for moderate pain.   ALPRAZolam 0.25 MG tablet Commonly known as: XANAX Take 0.25 mg by mouth 2 (two) times daily as needed for anxiety.   B-12 IJ Inject 1,000 mcg as directed once a week. Thursday   busPIRone 10 MG tablet Commonly known as: BUSPAR Take 10 mg by mouth 2 (two) times daily.   citalopram 40 MG tablet Commonly known as: CELEXA Take 40 mg by mouth in the morning.   docusate sodium 100 MG capsule Commonly known as: Colace Take 1 capsule (100 mg total) by mouth 2 (two) times daily as needed for up to 10 days for mild constipation.   gabapentin 300 MG capsule Commonly known as: NEURONTIN Take 300 mg by mouth every evening.   ibuprofen 200 MG tablet Commonly known as: ADVIL Take 600-800 mg by mouth  every 6 (six) hours as needed for moderate pain.   ICY HOT EX Apply 1 application topically daily as needed (leg pain).   losartan 100 MG tablet Commonly known as: COZAAR Take 100 mg by mouth in the morning.   meloxicam 15 MG tablet Commonly known as: MOBIC TAKE 1 TABLET(15 MG) BY MOUTH DAILY What changed: See the new instructions.   metoprolol tartrate 25 MG tablet Commonly known as: LOPRESSOR Take 25 mg by mouth 2 (two) times daily.   traMADol 50 MG tablet Commonly known as: Ultram Take 1 tablet (50 mg total) by mouth every 6 (six) hours as needed for severe pain.   traZODone 150 MG tablet Commonly known as: DESYREL Take 75 mg by mouth at bedtime.        Follow-up Information     Coleridge, Graden Hoshino, DO Follow up.   Specialty: Surgery Why: as scheduled Contact information: [address] [place] [id number]                  Total time spent arranging discharge was >74min. Signed: 31m 11/14/2020, 9:48 AM

## 2020-11-14 NOTE — Discharge Instructions (Signed)
Laparoscopic Colectomy, Care After This sheet gives you information about how to care for yourself after your procedure. Your health care provider may also give you more specific instructions. If you have problems or questions, contact your health care provider. What can I expect after the procedure? After your procedure, it is common to have the following: Pain in your abdomen, especially in the incision areas. You will be given medicine to control the pain. Tiredness. This is a normal part of the recovery process. Your energy level will return to normal over the next several weeks. Changes in your bowel movements, such as constipation or needing to go more often. Talk with your health care provider about how to manage this. Follow these instructions at home: Medicines  tylenol and advil as needed for discomfort.  Please alternate between the two every four hours as needed for pain.    Use narcotics, if prescribed, only when tylenol and motrin is not enough to control pain.  325-650mg every 8hrs to max of 4000mg/24hrs (including the 325mg in every norco dose) for the tylenol.    Advil up to 800mg per dose every 8hrs as needed for pain.   Do not drive or use heavy machinery while taking prescription pain medicine. Do not drink alcohol while taking prescription pain medicine. If you were prescribed an antibiotic medicine, use it as told by your health care provider. Do not stop using the antibiotic even if you start to feel better. Incision care    Follow instructions from your health care provider about how to take care of your incision areas. Make sure you: Keep your incisions clean and dry. Wash your hands with soap and water before and after applying medicine to the areas, and before and after changing your bandage (dressing). If soap and water are not available, use hand sanitizer. Change your dressing as told by your health care provider. Leave stitches (sutures), skin glue, or adhesive  strips in place. These skin closures may need to stay in place for 2 weeks or longer. If adhesive strip edges start to loosen and curl up, you may trim the loose edges. Do not remove adhesive strips completely unless your health care provider tells you to do that. Do not wear tight clothing over the incisions. Tight clothing may rub and irritate the incision areas, which may cause the incisions to open. Do not take baths, swim, or use a hot tub until your health care provider approves. OK TO SHOWER.   Check your incision area every day for signs of infection. Check for: More redness, swelling, or pain. More fluid or blood. Warmth. Pus or a bad smell. Activity Avoid lifting anything that is heavier than 10 lb (4.5 kg) for 2 weeks or until your health care provider says it is okay. You may resume normal activities as told by your health care provider. Ask your health care provider what activities are safe for you. Take rest breaks during the day as needed. Eating and drinking Follow instructions from your health care provider about what you can eat after surgery. To prevent or treat constipation while you are taking prescription pain medicine, your health care provider may recommend that you: Drink enough fluid to keep your urine clear or pale yellow. Take over-the-counter or prescription medicines. Eat foods that are high in fiber, such as fresh fruits and vegetables, whole grains, and beans. Limit foods that are high in fat and processed sugars, such as fried and sweet foods. General instructions Ask your   health care provider when you will need an appointment to get your sutures or staples removed. Keep all follow-up visits as told by your health care provider. This is important. Contact a health care provider if: You have more redness, swelling, or pain around your incisions. You have more fluid or blood coming from the incisions. Your incisions feel warm to the touch. You have pus or a  bad smell coming from your incisions or your dressing. You have a fever. You have an incision that breaks open (edges not staying together) after sutures or staples have been removed. Get help right away if: You develop a rash. You have chest pain or difficulty breathing. You have pain or swelling in your legs. You feel light-headed or you faint. Your abdomen swells (becomes distended). You have nausea or vomiting. You have blood in your stool (feces). This information is not intended to replace advice given to you by your health care provider. Make sure you discuss any questions you have with your health care provider. Document Released: 10/17/2004 Document Revised: 12/17/2017 Document Reviewed: 12/30/2015 Elsevier Interactive Patient Education  2019 [employer]    

## 2020-11-14 NOTE — Progress Notes (Signed)
Patient given instructions to keep follow up appointment, when to return for worsening symptoms, IV taken out, Medication education given, & awaiting transport via wheelchair.

## 2020-12-17 DIAGNOSIS — R1030 Lower abdominal pain, unspecified: Secondary | ICD-10-CM | POA: Diagnosis not present

## 2020-12-18 ENCOUNTER — Ambulatory Visit
Admission: RE | Admit: 2020-12-18 | Discharge: 2020-12-18 | Disposition: A | Discharge status: Home / Self Care | Payer: BC Managed Care – PPO | Source: Ambulatory Visit | Attending: Surgery | Admitting: Surgery

## 2020-12-19 ENCOUNTER — Other Ambulatory Visit: Payer: Self-pay | Admitting: Surgery

## 2020-12-19 DIAGNOSIS — K591 Functional diarrhea: Secondary | ICD-10-CM

## 2020-12-26 ENCOUNTER — Ambulatory Visit
Admission: RE | Admit: 2020-12-26 | Discharge: 2020-12-26 | Disposition: A | Discharge status: Home / Self Care | Payer: BC Managed Care – PPO | Source: Ambulatory Visit | Attending: Surgery | Admitting: Surgery

## 2020-12-26 ENCOUNTER — Other Ambulatory Visit: Payer: Self-pay

## 2020-12-26 DIAGNOSIS — K591 Functional diarrhea: Secondary | ICD-10-CM

## 2020-12-26 DIAGNOSIS — K5901 Slow transit constipation: Secondary | ICD-10-CM | POA: Diagnosis not present

## 2021-02-01 DIAGNOSIS — Z720 Tobacco use: Secondary | ICD-10-CM | POA: Diagnosis not present

## 2021-02-04 ENCOUNTER — Other Ambulatory Visit: Payer: Self-pay | Admitting: Internal Medicine

## 2021-02-04 DIAGNOSIS — N6322 Unspecified lump in the left breast, upper inner quadrant: Secondary | ICD-10-CM

## 2021-02-14 ENCOUNTER — Ambulatory Visit
Admission: RE | Admit: 2021-02-14 | Discharge: 2021-02-14 | Disposition: A | Discharge status: Home / Self Care | Payer: BC Managed Care – PPO | Source: Ambulatory Visit | Attending: Internal Medicine | Admitting: Internal Medicine

## 2021-02-14 ENCOUNTER — Other Ambulatory Visit: Payer: Self-pay

## 2021-02-14 DIAGNOSIS — N6322 Unspecified lump in the left breast, upper inner quadrant: Secondary | ICD-10-CM | POA: Insufficient documentation

## 2021-02-14 DIAGNOSIS — R922 Inconclusive mammogram: Secondary | ICD-10-CM | POA: Diagnosis not present

## 2021-04-21 DIAGNOSIS — R509 Fever, unspecified: Secondary | ICD-10-CM | POA: Diagnosis not present

## 2021-04-21 DIAGNOSIS — U071 COVID-19: Secondary | ICD-10-CM | POA: Diagnosis not present

## 2021-08-04 DIAGNOSIS — R1084 Generalized abdominal pain: Secondary | ICD-10-CM | POA: Diagnosis not present

## 2021-08-04 DIAGNOSIS — A049 Bacterial intestinal infection, unspecified: Secondary | ICD-10-CM | POA: Diagnosis not present

## 2021-08-04 DIAGNOSIS — R197 Diarrhea, unspecified: Secondary | ICD-10-CM | POA: Diagnosis not present

## 2021-08-04 DIAGNOSIS — R110 Nausea: Secondary | ICD-10-CM | POA: Diagnosis not present

## 2021-08-04 DIAGNOSIS — R140 Abdominal distension (gaseous): Secondary | ICD-10-CM | POA: Diagnosis not present

## 2021-09-17 ENCOUNTER — Ambulatory Visit: Payer: BC Managed Care – PPO | Admitting: Internal Medicine

## 2021-09-17 ENCOUNTER — Encounter: Payer: Self-pay | Admitting: Internal Medicine

## 2021-09-17 VITALS — BP 124/74 | HR 65 | Temp 98.1°F | Ht 68.0 in | Wt 187.2 lb

## 2021-09-17 DIAGNOSIS — E538 Deficiency of other specified B group vitamins: Secondary | ICD-10-CM | POA: Diagnosis not present

## 2021-09-17 DIAGNOSIS — R419 Unspecified symptoms and signs involving cognitive functions and awareness: Secondary | ICD-10-CM

## 2021-09-17 DIAGNOSIS — R1319 Other dysphagia: Secondary | ICD-10-CM

## 2021-09-17 DIAGNOSIS — R635 Abnormal weight gain: Secondary | ICD-10-CM | POA: Diagnosis not present

## 2021-09-17 DIAGNOSIS — R7301 Impaired fasting glucose: Secondary | ICD-10-CM

## 2021-09-17 DIAGNOSIS — S29011A Strain of muscle and tendon of front wall of thorax, initial encounter: Secondary | ICD-10-CM

## 2021-09-17 DIAGNOSIS — R1031 Right lower quadrant pain: Secondary | ICD-10-CM

## 2021-09-17 LAB — COMPREHENSIVE METABOLIC PANEL
ALT: 19 U/L (ref 0–35)
AST: 17 U/L (ref 0–37)
Albumin: 4.2 g/dL (ref 3.5–5.2)
Alkaline Phosphatase: 60 U/L (ref 39–117)
BUN: 18 mg/dL (ref 6–23)
CO2: 28 mEq/L (ref 19–32)
Calcium: 9.7 mg/dL (ref 8.4–10.5)
Chloride: 104 mEq/L (ref 96–112)
Creatinine, Ser: 0.81 mg/dL (ref 0.40–1.20)
GFR: 79.5 mL/min (ref >60.00)
Glucose, Bld: 96 mg/dL (ref 70–99)
Potassium: 4.4 mEq/L (ref 3.5–5.1)
Sodium: 138 mEq/L (ref 135–145)
Total Bilirubin: 0.8 mg/dL (ref 0.2–1.2)
Total Protein: 6.7 g/dL (ref 6.0–8.3)

## 2021-09-17 LAB — LIPID PANEL
Cholesterol: 208 mg/dL — ABNORMAL HIGH (ref 0–200)
HDL: 53 mg/dL (ref >39.00)
LDL Cholesterol: 123 mg/dL — ABNORMAL HIGH (ref 0–99)
NonHDL: 154.66
Total CHOL/HDL Ratio: 4
Triglycerides: 158 mg/dL — ABNORMAL HIGH (ref 0.0–149.0)
VLDL: 31.6 mg/dL (ref 0.0–40.0)

## 2021-09-17 LAB — TSH: TSH: 1.03 u[IU]/mL (ref 0.35–5.50)

## 2021-09-17 LAB — B12 AND FOLATE PANEL
Folate: >24.2 ng/mL (ref >5.9)
Vitamin B-12: >1504 pg/mL — ABNORMAL HIGH (ref 211–911)

## 2021-09-17 LAB — HEMOGLOBIN A1C: Hgb A1c MFr Bld: 5.5 % (ref 4.6–6.5)

## 2021-09-17 NOTE — Assessment & Plan Note (Signed)
For pills, and occasionally  for solids.  DG esophagus ordered

## 2021-09-17 NOTE — Assessment & Plan Note (Addendum)
Thyroid, b12 and folate are normal.   Will return for MMSE in one month

## 2021-09-17 NOTE — Assessment & Plan Note (Signed)
Present since her colectomy.  Etiology includes hernia, loculated fluid collection, retained stitch. CT abd pelvis ordered.

## 2021-09-17 NOTE — Progress Notes (Signed)
Subjective:  Patient ID: Allison Alvarez, female    DOB: December 17, 1960  Age: 61 y.o. MRN: [id number]  CC: The primary encounter diagnosis was B12 deficiency. Diagnoses of Cognitive complaints, Weight gain, Impaired fasting blood sugar, Right lower quadrant abdominal pain, Esophageal dysphagia, and Pectoralis muscle strain, initial encounter were also pertinent to this visit.  Transferring from [facility] [facility] at [place].  Referred by Freddy Jaksch .  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  Chronic.  Started after recovering from  colonic resection 2022  by  Dr. Tonna Boehringer at [facility], for recurrent diverticulitis:  since then has had  ongoing RLQ tenderness   aggravated by lying on her stomach . Dull tenderness .  Bowels are  moving regularly ,  alternates between solid  and loose , when loose it occurs several times daily  .   2)  Weight gain of ten lbs since her surgery.  No regular exercise.  Diet reviewed:   Carbs for breakfast,  big lunch,  small dinner  ,  feels like appetite has decreased since her bowel surgery.    3) right breast tenderness:  chronic,  has had diagnostic mammogram and ultrasound normal.  Medial quadrant  4)) h/o fibromyalgia managed with medication.  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  Elderly parents,  family dysfunction.  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  Doesn't remember details of cases the next day (handles 25-30 cases daily) . Some episodes of word finding problems. Family history of alzheimers dementia on father's side.  B12 w4 weas low in July,  taking a monthly shot after 4 weekly doses,  also a daily sublingual  6) h/o vesicovaginal fistula repair (complication of hysterectomy) : has frequency  and urge incontinence. No prior urology   7) occasional dysphagia for  pills   on left side. Rarely with food.  Not with liquids  Former smoker,  quit 2 yrs ago 45 pack years.  Still vapes   8) internal hemorrhoids bleed during diarrheal episodes.      HPI Allison Alvarez presents for establishment of care   History Allison Alvarez has a past medical history of Allergy, Anxiety, Arthritis, Depression, Diverticulitis, Fibromyalgia, Hyperlipidemia, Hypertension, PONV (postoperative nausea and vomiting), and PVC (premature ventricular contraction).   She has a past surgical history that includes Abdominal hysterectomy (2007); Cholecystectomy; Appendectomy (2008); Bladder repair (2008); Colonoscopy; Esophagogastroduodenoscopy (07/2020); Tubal ligation (1996); Vesicovaginal fistula closure (2008); and Colon resection (11/2020).   Her family history includes Anxiety disorder in her daughter and son; Arthritis in her brother, father, maternal grandmother, and mother; Breast cancer (age of onset: 34) in her paternal aunt; Cancer in her brother, maternal grandmother, and paternal grandfather; Depression in her brother, father, maternal grandfather, and mother; Diabetes in her maternal grandfather; Drug abuse in her brother; Early death in her father; Hyperlipidemia in her mother; Hypertension in her maternal grandmother and mother; Mental illness in her brother and mother; Stroke in her father.She reports that she quit smoking about 20 months ago. Her smoking use included cigarettes. She smoked an average of 1 pack per day. She has never used smokeless tobacco. She reports current alcohol use. She reports that she does not use drugs.  Outpatient Medications Prior to Visit  Medication Sig Dispense Refill  acetaminophen (TYLENOL) 325 MG tablet Take 650 mg by mouth every 6 (six) hours as needed for moderate pain.     ALPRAZolam (XANAX) 0.25 MG tablet Take 0.25 mg by mouth 2 (two) times daily as needed for anxiety.     busPIRone (BUSPAR) 10 MG tablet Take 10 mg by mouth 2 (two) times  daily.     citalopram (CELEXA) 40 MG tablet Take 40 mg by mouth in the morning.     cyanocobalamin (,VITAMIN B-12,) 1000 MCG/ML injection Inject into the muscle.     Cyanocobalamin (B-12 SL) Place 700 mcg under the tongue daily.     gabapentin (NEURONTIN) 300 MG capsule Take 300 mg by mouth every evening.     losartan (COZAAR) 100 MG tablet Take 100 mg by mouth in the morning.     meloxicam (MOBIC) 15 MG tablet TAKE 1 TABLET(15 MG) BY MOUTH DAILY 30 tablet 3   Menthol, Topical Analgesic, (ICY HOT EX) Apply 1 application topically daily as needed (leg pain).     metoprolol tartrate (LOPRESSOR) 25 MG tablet Take 25 mg by mouth 2 (two) times daily.     traZODone (DESYREL) 150 MG tablet Take 75 mg by mouth at bedtime.     Cyanocobalamin (B-12 IJ) Inject 1,000 mcg as directed every 30 (thirty) days. Thursday     ibuprofen (ADVIL) 200 MG tablet Take 600-800 mg by mouth every 6 (six) hours as needed for moderate pain.     traMADol (ULTRAM) 50 MG tablet Take 1 tablet (50 mg total) by mouth every 6 (six) hours as needed for severe pain. (Patient not taking: Reported on 09/17/2021) 10 tablet 0   No facility-administered medications prior to visit.    Review of Systems:  Patient denies headache, fevers, malaise, unintentional weight loss, skin rash, eye pain, sinus congestion and sinus pain, sore throat, dysphagia,  hemoptysis , cough, dyspnea, wheezing, chest pain, palpitations, orthopnea, edema, abdominal pain, nausea, melena, diarrhea, constipation, flank pain, dysuria, hematuria, urinary  Frequency, nocturia, numbness, tingling, seizures,  Focal weakness, Loss of consciousness,  Tremor, insomnia, depression, anxiety, and suicidal ideation.     Objective:  BP 124/74 (BP Location: Left Arm, Patient Position: Sitting, Cuff Size: Large)   Pulse 65   Temp 98.1 F (36.7 C) (Oral)   Ht 5\' 8"  (1.727 m)   Wt 187 lb 3.2 oz (84.9 kg)   SpO2 96%   BMI 28.46 kg/m   Physical Exam:   Assessment & Plan:    Problem List Items Addressed This Visit     Cognitive complaints    Thyroid, b12 and folate are normal.   Will return for MMSE in one month        Relevant Orders   TSH (Completed)   RPR   Esophageal dysphagia    For pills, and occasionally  for solids.  DG esophagus ordered        Relevant Orders   DG ESOPHAGUS W SINGLE CM (SOL OR THIN BA)   Pectoralis muscle strain    Left breast,  Presumed given normal diagnostic mammogram/ultrasound.  Recommend use of sports bra for a week to allow resolution       Right lower quadrant abdominal pain    Present since her colectomy.  Etiology includes hernia, loculated fluid collection, retained stitch. CT abd pelvis ordered.       Relevant Orders   CT Abdomen Pelvis Wo Contrast   Other Visit Diagnoses     B12 deficiency    -  Primary   Relevant Orders   B12 and Folate Panel (Completed)   Weight gain       Relevant Orders   Comprehensive metabolic panel (Completed)   Lipid panel (Completed)   Impaired fasting blood sugar       Relevant Orders   Hemoglobin A1c (Completed)      A total of 55 minutes was spent with patient more than half of which was spent in counseling patient on the above mentioned issues , reviewing previous labs and imaging studies done, and coordination of care.   Medications Discontinued During This Encounter  Medication Reason   ibuprofen (ADVIL) 200 MG tablet    traMADol (ULTRAM) 50 MG tablet    Cyanocobalamin (B-12 IJ) Duplicate    Follow-up: Return in about 4 weeks (around 10/15/2021).   Sherlene Shamseresa L Milania Haubner, MD

## 2021-09-17 NOTE — Assessment & Plan Note (Signed)
Left breast,  Presumed given normal diagnostic mammogram/ultrasound.  Recommend use of sports bra for a week to allow resolution

## 2021-09-17 NOTE — Patient Instructions (Signed)
CT of abd and pelvis ordered  Swallow evaluation ordered   RTC one month    For breast pain:  might be a strained pectoralis muscle.  try wearing sports bras for a week to give the  pec muscle a rest

## 2021-09-18 LAB — RPR: RPR Ser Ql: NONREACTIVE

## 2021-09-25 ENCOUNTER — Ambulatory Visit
Admission: RE | Admit: 2021-09-25 | Discharge: 2021-09-25 | Disposition: A | Discharge status: Home / Self Care | Payer: BC Managed Care – PPO | Source: Ambulatory Visit | Attending: Internal Medicine | Admitting: Internal Medicine

## 2021-09-25 DIAGNOSIS — R1031 Right lower quadrant pain: Secondary | ICD-10-CM | POA: Insufficient documentation

## 2021-09-25 DIAGNOSIS — K5732 Diverticulitis of large intestine without perforation or abscess without bleeding: Secondary | ICD-10-CM | POA: Insufficient documentation

## 2021-09-25 DIAGNOSIS — K219 Gastro-esophageal reflux disease without esophagitis: Secondary | ICD-10-CM | POA: Diagnosis not present

## 2021-09-25 DIAGNOSIS — I700 Atherosclerosis of aorta: Secondary | ICD-10-CM | POA: Diagnosis not present

## 2021-09-25 DIAGNOSIS — R109 Unspecified abdominal pain: Secondary | ICD-10-CM | POA: Diagnosis not present

## 2021-09-25 DIAGNOSIS — R1319 Other dysphagia: Secondary | ICD-10-CM | POA: Insufficient documentation

## 2021-09-28 ENCOUNTER — Other Ambulatory Visit: Payer: Self-pay | Admitting: Internal Medicine

## 2021-09-28 DIAGNOSIS — K5732 Diverticulitis of large intestine without perforation or abscess without bleeding: Secondary | ICD-10-CM

## 2021-09-28 MED ORDER — AMOXICILLIN-POT CLAVULANATE 875-125 MG PO TABS: 1.0000 | ORAL_TABLET | Freq: Two times a day (BID) | ORAL | 0 refills | Status: DC

## 2021-09-28 NOTE — Assessment & Plan Note (Addendum)
Persistent changes on June 2023 CT noted in the mid descending colon.  Recommending empiric antibiotics, clear liquid diet.  probitoics for 3 weesks,  And follow up with surgeon who died her 08-21-22colonic resection

## 2021-10-07 ENCOUNTER — Encounter: Payer: Self-pay | Admitting: Internal Medicine

## 2021-10-08 ENCOUNTER — Emergency Department: Payer: BC Managed Care – PPO

## 2021-10-08 ENCOUNTER — Encounter: Payer: Self-pay | Admitting: Intensive Care

## 2021-10-08 ENCOUNTER — Emergency Department
Admission: EM | Admit: 2021-10-08 | Discharge: 2021-10-08 | Disposition: A | Discharge status: Home / Self Care | Payer: BC Managed Care – PPO | Attending: Emergency Medicine | Admitting: Emergency Medicine

## 2021-10-08 ENCOUNTER — Other Ambulatory Visit: Payer: Self-pay

## 2021-10-08 ENCOUNTER — Telehealth: Payer: Self-pay | Admitting: Internal Medicine

## 2021-10-08 DIAGNOSIS — S060X0A Concussion without loss of consciousness, initial encounter: Secondary | ICD-10-CM

## 2021-10-08 DIAGNOSIS — R519 Headache, unspecified: Secondary | ICD-10-CM | POA: Insufficient documentation

## 2021-10-08 DIAGNOSIS — I10 Essential (primary) hypertension: Secondary | ICD-10-CM | POA: Insufficient documentation

## 2021-10-08 DIAGNOSIS — M47812 Spondylosis without myelopathy or radiculopathy, cervical region: Secondary | ICD-10-CM | POA: Diagnosis not present

## 2021-10-08 DIAGNOSIS — M542 Cervicalgia: Secondary | ICD-10-CM | POA: Diagnosis not present

## 2021-10-08 DIAGNOSIS — I6522 Occlusion and stenosis of left carotid artery: Secondary | ICD-10-CM | POA: Diagnosis not present

## 2021-10-08 DIAGNOSIS — M25512 Pain in left shoulder: Secondary | ICD-10-CM | POA: Insufficient documentation

## 2021-10-08 DIAGNOSIS — R42 Dizziness and giddiness: Secondary | ICD-10-CM | POA: Diagnosis not present

## 2021-10-08 LAB — CBC WITH DIFFERENTIAL/PLATELET
Abs Immature Granulocytes: 0.01 10*3/uL (ref 0.00–0.07)
Basophils Absolute: 0 10*3/uL (ref 0.0–0.1)
Basophils Relative: 1 %
Eosinophils Absolute: 0.1 10*3/uL (ref 0.0–0.5)
Eosinophils Relative: 2 %
HCT: 32.8 % — ABNORMAL LOW (ref 36.0–46.0)
Hemoglobin: 10.8 g/dL — ABNORMAL LOW (ref 12.0–15.0)
Immature Granulocytes: 0 %
Lymphocytes Relative: 37 %
Lymphs Abs: 2.2 10*3/uL (ref 0.7–4.0)
MCH: 32.1 pg (ref 26.0–34.0)
MCHC: 32.9 g/dL (ref 30.0–36.0)
MCV: 97.6 fL (ref 80.0–100.0)
Monocytes Absolute: 0.4 10*3/uL (ref 0.1–1.0)
Monocytes Relative: 7 %
Neutro Abs: 3.1 10*3/uL (ref 1.7–7.7)
Neutrophils Relative %: 53 %
Platelets: 262 10*3/uL (ref 150–400)
RBC: 3.36 MIL/uL — ABNORMAL LOW (ref 3.87–5.11)
RDW: 12.5 % (ref 11.5–15.5)
WBC: 5.9 10*3/uL (ref 4.0–10.5)
nRBC: 0 % (ref 0.0–0.2)

## 2021-10-08 LAB — BASIC METABOLIC PANEL
Anion gap: 7 (ref 5–15)
BUN: 17 mg/dL (ref 6–20)
CO2: 26 mmol/L (ref 22–32)
Calcium: 9 mg/dL (ref 8.9–10.3)
Chloride: 108 mmol/L (ref 98–111)
Creatinine, Ser: 0.74 mg/dL (ref 0.44–1.00)
GFR, Estimated: >60 mL/min (ref >60)
Glucose, Bld: 96 mg/dL (ref 70–99)
Potassium: 3.5 mmol/L (ref 3.5–5.1)
Sodium: 141 mmol/L (ref 135–145)

## 2021-10-08 MED ORDER — FENTANYL CITRATE PF 50 MCG/ML IJ SOSY
50.0000 ug | PREFILLED_SYRINGE | Freq: Once | INTRAMUSCULAR | Status: AC
  Administered 2021-10-08: 50 ug via INTRAVENOUS
  Filled 2021-10-08: qty 1

## 2021-10-08 MED ORDER — METOCLOPRAMIDE HCL 5 MG/ML IJ SOLN
10.0000 mg | Freq: Once | INTRAMUSCULAR | Status: AC
  Administered 2021-10-08: 10 mg via INTRAVENOUS
  Filled 2021-10-08: qty 2

## 2021-10-08 MED ORDER — KETOROLAC TROMETHAMINE 15 MG/ML IJ SOLN
15.0000 mg | Freq: Once | INTRAMUSCULAR | Status: AC
  Administered 2021-10-08: 15 mg via INTRAMUSCULAR
  Filled 2021-10-08: qty 1

## 2021-10-08 MED ORDER — DIPHENHYDRAMINE HCL 50 MG/ML IJ SOLN
25.0000 mg | Freq: Once | INTRAMUSCULAR | Status: AC
Start: 2021-10-08 — End: 2021-10-08
  Administered 2021-10-08: 25 mg via INTRAVENOUS
  Filled 2021-10-08: qty 1

## 2021-10-08 MED ORDER — KETOROLAC TROMETHAMINE 30 MG/ML IJ SOLN: 15.0000 mg | Freq: Once | INTRAMUSCULAR | Status: DC

## 2021-10-08 MED ORDER — LIDOCAINE 5 % EX PTCH
1.0000 | MEDICATED_PATCH | CUTANEOUS | Status: DC
  Administered 2021-10-08: 1 via TRANSDERMAL
  Filled 2021-10-08: qty 1

## 2021-10-08 MED ORDER — CLONIDINE HCL 0.1 MG PO TABS
0.1000 mg | ORAL_TABLET | Freq: Once | ORAL | Status: AC
  Administered 2021-10-08: 0.1 mg via ORAL
  Filled 2021-10-08: qty 1

## 2021-10-08 MED ORDER — IOHEXOL 350 MG/ML SOLN
75.0000 mL | Freq: Once | INTRAVENOUS | Status: AC | PRN
  Administered 2021-10-08: 75 mL via INTRAVENOUS
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
## 2021-10-08 NOTE — Telephone Encounter (Signed)
Pt is currently at the ED
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
## 2021-10-10 ENCOUNTER — Other Ambulatory Visit: Payer: Self-pay | Admitting: Internal Medicine

## 2021-10-10 ENCOUNTER — Other Ambulatory Visit: Payer: Self-pay

## 2021-10-10 ENCOUNTER — Telehealth: Payer: Self-pay

## 2021-10-10 ENCOUNTER — Emergency Department
Admission: EM | Admit: 2021-10-10 | Discharge: 2021-10-10 | Disposition: A | Discharge status: Home / Self Care | Payer: BC Managed Care – PPO | Attending: Emergency Medicine | Admitting: Emergency Medicine

## 2021-10-10 ENCOUNTER — Emergency Department: Payer: BC Managed Care – PPO

## 2021-10-10 DIAGNOSIS — R519 Headache, unspecified: Secondary | ICD-10-CM

## 2021-10-10 DIAGNOSIS — I10 Essential (primary) hypertension: Secondary | ICD-10-CM | POA: Diagnosis not present

## 2021-10-10 DIAGNOSIS — K5732 Diverticulitis of large intestine without perforation or abscess without bleeding: Secondary | ICD-10-CM

## 2021-10-10 DIAGNOSIS — R1031 Right lower quadrant pain: Secondary | ICD-10-CM

## 2021-10-10 LAB — BASIC METABOLIC PANEL
Anion gap: 8 (ref 5–15)
BUN: 13 mg/dL (ref 6–20)
CO2: 28 mmol/L (ref 22–32)
Calcium: 9.3 mg/dL (ref 8.9–10.3)
Chloride: 104 mmol/L (ref 98–111)
Creatinine, Ser: 0.82 mg/dL (ref 0.44–1.00)
GFR, Estimated: >60 mL/min (ref >60)
Glucose, Bld: 130 mg/dL — ABNORMAL HIGH (ref 70–99)
Potassium: 3.6 mmol/L (ref 3.5–5.1)
Sodium: 140 mmol/L (ref 135–145)

## 2021-10-10 LAB — CBC
HCT: 36.9 % (ref 36.0–46.0)
Hemoglobin: 12 g/dL (ref 12.0–15.0)
MCH: 32 pg (ref 26.0–34.0)
MCHC: 32.5 g/dL (ref 30.0–36.0)
MCV: 98.4 fL (ref 80.0–100.0)
Platelets: 291 10*3/uL (ref 150–400)
RBC: 3.75 MIL/uL — ABNORMAL LOW (ref 3.87–5.11)
RDW: 12.3 % (ref 11.5–15.5)
WBC: 6.2 10*3/uL (ref 4.0–10.5)
nRBC: 0 % (ref 0.0–0.2)

## 2021-10-10 LAB — TROPONIN I (HIGH SENSITIVITY): Troponin I (High Sensitivity): 8 ng/L (ref <18)

## 2021-10-10 MED ORDER — HYDRALAZINE HCL 50 MG PO TABS
25.0000 mg | ORAL_TABLET | ORAL | Status: AC
  Administered 2021-10-10: 25 mg via ORAL
  Filled 2021-10-10: qty 1

## 2021-10-10 MED ORDER — CLONIDINE HCL 0.1 MG PO TABS
0.2000 mg | ORAL_TABLET | Freq: Once | ORAL | Status: AC
  Administered 2021-10-10: 0.2 mg via ORAL
  Filled 2021-10-10: qty 2

## 2021-10-10 MED ORDER — HYDRALAZINE HCL 25 MG PO TABS: 25.0000 mg | ORAL_TABLET | Freq: Three times a day (TID) | ORAL | 0 refills | Status: DC
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
## 2021-10-10 NOTE — Telephone Encounter (Signed)
Pt is currently back at the ED.

## 2021-10-10 NOTE — ED Notes (Signed)
Pt in bed, sig other at bedside, pt states that she is ready to go home.  Pt verbalized understanding d/c instructions and follow up, pt ambulatory from dpt with sig other.

## 2021-10-10 NOTE — Discharge Instructions (Signed)
Your CT scan and lab tests were okay.  Continue taking all of your previous home medications and add hydralazine 3 times a day as prescribed.  Continue monitoring your blood pressure at home and follow-up with your primary care doctor next week.

## 2021-10-10 NOTE — ED Notes (Signed)
Patient drinking soda and eating chips from snack machine. Ambulatory back to room with NAD noted
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
## 2021-10-10 NOTE — Telephone Encounter (Signed)
Patient in the ed

## 2021-10-10 NOTE — ED Notes (Signed)
Pt to ct 
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
## 2021-10-13 ENCOUNTER — Telehealth: Payer: Self-pay | Admitting: Internal Medicine

## 2021-10-13 ENCOUNTER — Encounter: Payer: Self-pay | Admitting: Internal Medicine

## 2021-10-13 DIAGNOSIS — S060XAA Concussion with loss of consciousness status unknown, initial encounter: Secondary | ICD-10-CM | POA: Insufficient documentation

## 2021-10-13 DIAGNOSIS — Z87820 Personal history of traumatic brain injury: Secondary | ICD-10-CM | POA: Insufficient documentation

## 2021-10-13 MED ORDER — AMLODIPINE BESYLATE 2.5 MG PO TABS: 2.5000 mg | ORAL_TABLET | Freq: Every day | ORAL | 1 refills | Status: DC

## 2021-10-13 NOTE — Addendum Note (Signed)
Addended by: Sherlene Shams on: 10/13/2021 03:37 PM   Modules accepted: Orders
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
## 2021-10-13 NOTE — Telephone Encounter (Signed)
Adding amlodipine 2.5 mg daily  to regimen. Once daily

## 2021-10-13 NOTE — Telephone Encounter (Signed)
Spoke with pt to inform her of the additional blood pressure medication that Dr. Darrick Huntsman has sent in. Pt gave a verbal understanding.

## 2021-10-13 NOTE — Telephone Encounter (Signed)
Pt is scheduled for 10/15/2021. 

## 2021-10-13 NOTE — Telephone Encounter (Signed)
Letter has been created and sent to pt's mychart.

## 2021-10-15 ENCOUNTER — Ambulatory Visit: Payer: BC Managed Care – PPO | Admitting: Internal Medicine

## 2021-10-15 ENCOUNTER — Encounter: Payer: Self-pay | Admitting: Internal Medicine

## 2021-10-15 VITALS — BP 132/82 | HR 71 | Temp 98.0°F | Ht 68.0 in | Wt 187.0 lb

## 2021-10-15 DIAGNOSIS — S060X0A Concussion without loss of consciousness, initial encounter: Secondary | ICD-10-CM | POA: Diagnosis not present

## 2021-10-15 DIAGNOSIS — I10 Essential (primary) hypertension: Secondary | ICD-10-CM | POA: Diagnosis not present

## 2021-10-15 DIAGNOSIS — Z1211 Encounter for screening for malignant neoplasm of colon: Secondary | ICD-10-CM

## 2021-10-15 MED ORDER — ALPRAZOLAM 0.25 MG PO TABS: 0.2500 mg | ORAL_TABLET | Freq: Two times a day (BID) | ORAL | 5 refills | Status: DC | PRN

## 2021-10-15 MED ORDER — TIZANIDINE HCL 2 MG PO TABS: 2.0000 mg | ORAL_TABLET | Freq: Four times a day (QID) | ORAL | 0 refills | Status: DC | PRN
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
## 2021-10-15 NOTE — Patient Instructions (Addendum)
Use amlodipine 2.5 mg every 12 hours  Continue tylenol for pain  you can use up to 4000 mg daily for one week   Adding tizanidine , a muscle relaxer for your neck spasms  NO EXERCISE.  Just floating .  No underwater, holding breath etc
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
## 2021-10-16 ENCOUNTER — Other Ambulatory Visit: Payer: Self-pay
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
## 2021-10-16 NOTE — Progress Notes (Signed)
Wants appointment

## 2021-10-17 ENCOUNTER — Encounter: Payer: Self-pay | Admitting: Internal Medicine

## 2021-10-17 NOTE — Telephone Encounter (Signed)
LMTCB patient needs to be evaluated ASAP. Wanted to call back to advise of this & triage.

## 2021-10-21 ENCOUNTER — Ambulatory Visit: Payer: BC Managed Care – PPO | Admitting: Internal Medicine

## 2021-10-21 ENCOUNTER — Encounter: Payer: Self-pay | Admitting: Internal Medicine

## 2021-10-21 VITALS — BP 128/86 | HR 85 | Temp 98.3°F | Ht 68.0 in | Wt 187.8 lb

## 2021-10-21 DIAGNOSIS — S060X0D Concussion without loss of consciousness, subsequent encounter: Secondary | ICD-10-CM

## 2021-10-21 MED ORDER — DIAZEPAM 5 MG PO TABS: 5.0000 mg | ORAL_TABLET | Freq: Two times a day (BID) | ORAL | 1 refills | Status: DC | PRN

## 2021-10-21 MED ORDER — PREDNISONE 10 MG PO TABS: ORAL_TABLET | ORAL | 0 refills | Status: DC

## 2021-10-21 NOTE — Telephone Encounter (Signed)
Pt is scheduled with Dr. Darrick Huntsman today.
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
## 2021-10-21 NOTE — Patient Instructions (Addendum)
  I recommend another week of brain rest:  no work,  no TV,  no electronic media, no physical exertion  Referral to neurology in process  Steroid taper to help with the neck pain .  Use the valium twice daily to help you relax and stop worrying  .  Start with 5 mg dose,  you may repeat the dose after 30 minutes if you are still anxious  Stop checking your BP!!!

## 2021-10-23 ENCOUNTER — Telehealth: Payer: Self-pay

## 2021-10-23 NOTE — Telephone Encounter (Signed)
Patient states her insurance company has authorized a Electrical engineer for her to use, but she would like to confirm with Dr. Duncan Dull that she is ok to drive short distances before she accepts the vehicle.  Please call.

## 2021-10-23 NOTE — Telephone Encounter (Signed)
Pt called and advised of Dr. Melina Schools message from below.Pt verbalized understanding of this.

## 2021-10-28 ENCOUNTER — Encounter: Payer: Self-pay | Admitting: Internal Medicine

## 2021-10-28 ENCOUNTER — Ambulatory Visit: Payer: BC Managed Care – PPO | Admitting: Internal Medicine

## 2021-10-28 DIAGNOSIS — S060X0S Concussion without loss of consciousness, sequela: Secondary | ICD-10-CM | POA: Diagnosis not present
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
## 2021-10-28 NOTE — Patient Instructions (Signed)
I'm glad you are feeling better  You may return to work as long as you can rest your eyes every hour and notify me if the headache returns

## 2021-10-28 NOTE — Assessment & Plan Note (Signed)
Headache resolved.  May return to work with restrictions related to use of electronics to one hour followed by an hour of eye rest.

## 2021-10-29 ENCOUNTER — Ambulatory Visit: Payer: BC Managed Care – PPO | Admitting: Surgery

## 2021-11-04 ENCOUNTER — Encounter: Payer: Self-pay | Admitting: Internal Medicine

## 2021-11-04 ENCOUNTER — Other Ambulatory Visit: Payer: Self-pay | Admitting: Internal Medicine

## 2021-11-04 NOTE — Addendum Note (Signed)
Addended by: Sherlene Shams on: 11/04/2021 12:59 PM   Modules accepted: Orders
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
## 2021-11-08 ENCOUNTER — Encounter: Payer: Self-pay | Admitting: Internal Medicine

## 2021-11-10 ENCOUNTER — Ambulatory Visit (INDEPENDENT_AMBULATORY_CARE_PROVIDER_SITE_OTHER): Payer: BC Managed Care – PPO | Admitting: Family Medicine

## 2021-11-10 VITALS — BP 134/78 | HR 96 | Ht 68.0 in | Wt 194.4 lb

## 2021-11-10 DIAGNOSIS — R42 Dizziness and giddiness: Secondary | ICD-10-CM

## 2021-11-10 DIAGNOSIS — S060X0A Concussion without loss of consciousness, initial encounter: Secondary | ICD-10-CM

## 2021-11-10 DIAGNOSIS — F4310 Post-traumatic stress disorder, unspecified: Secondary | ICD-10-CM

## 2021-11-10 DIAGNOSIS — M542 Cervicalgia: Secondary | ICD-10-CM | POA: Diagnosis not present

## 2021-11-10 DIAGNOSIS — M7989 Other specified soft tissue disorders: Secondary | ICD-10-CM

## 2021-11-10 DIAGNOSIS — M545 Low back pain, unspecified: Secondary | ICD-10-CM | POA: Diagnosis not present

## 2021-11-10 NOTE — Telephone Encounter (Signed)
Spoke with pt and she stated that she has started experiencing bilateral lower extremity swelling, no redness or warmth. Pt stated that she was started on Amlodipine about a month ago and after about a week of it she was increased to taking it BID. Pt is scheduled for a 15 minute appt on Wednesday at 4:30 for the swelling.

## 2021-11-10 NOTE — Patient Instructions (Addendum)
Thank you for coming in today.   EMDR will help.   PT for your neck  Vestibular PT will help.   Recheck in 3 weeks.  Virtual video visit will help.

## 2021-11-12 ENCOUNTER — Encounter: Payer: Self-pay | Admitting: Internal Medicine

## 2021-11-12 ENCOUNTER — Ambulatory Visit: Payer: BC Managed Care – PPO | Admitting: Internal Medicine

## 2021-11-12 ENCOUNTER — Ambulatory Visit (INDEPENDENT_AMBULATORY_CARE_PROVIDER_SITE_OTHER): Payer: BC Managed Care – PPO

## 2021-11-12 DIAGNOSIS — R600 Localized edema: Secondary | ICD-10-CM

## 2021-11-12 DIAGNOSIS — F411 Generalized anxiety disorder: Secondary | ICD-10-CM

## 2021-11-12 DIAGNOSIS — M7989 Other specified soft tissue disorders: Secondary | ICD-10-CM | POA: Diagnosis not present

## 2021-11-12 MED ORDER — HYDROCHLOROTHIAZIDE 25 MG PO TABS: 25.0000 mg | ORAL_TABLET | Freq: Every day | ORAL | 3 refills | Status: DC

## 2021-11-12 NOTE — Assessment & Plan Note (Signed)
Given two prior surgeries on achilles heel and ankle,  She likely has venous insufficiency aggravated by use of amlodipine,  hydralazine and gabapentin .  Today we stopped hydralazine and started hctz. Will reassess  BP and symptoms  In a few weeks,   Will stop amlodipine next and add beta blocker which may help her PTSD
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
## 2021-11-12 NOTE — Assessment & Plan Note (Signed)
Now with elements of PTSD.  Continue valium prn .  Alprazolam discontinued.  counselling recommended

## 2021-11-12 NOTE — Progress Notes (Signed)
Vascular ultrasound shows no evidence of DVT.

## 2021-11-12 NOTE — Patient Instructions (Signed)
I agree that alprazolam is not needed or recommended while you are taking valium   Stop the hydralazine.  Continue amlodipine and losartan..     I would like you to try HCTZ, a mild diuretic that is most effective if taken once daily in the morning. I have sent it to your pharmacy.    It may cause increased urination for the first two weeks while your body gets used to it, but after a few weeks your trips to the bathroom should settle down.  BP medication takes about a week to reach its maximum effect on blood pressure, so repeat your readingsafter a week and send me the results.   The next change we will make (if needed ) is to stop the amlodipine and use metoprolol instead.

## 2021-11-17 ENCOUNTER — Telehealth: Payer: Self-pay

## 2021-11-17 DIAGNOSIS — M545 Low back pain, unspecified: Secondary | ICD-10-CM | POA: Diagnosis not present

## 2021-11-17 DIAGNOSIS — M542 Cervicalgia: Secondary | ICD-10-CM | POA: Diagnosis not present

## 2021-11-17 DIAGNOSIS — F0781 Postconcussional syndrome: Secondary | ICD-10-CM | POA: Diagnosis not present

## 2021-11-17 NOTE — Telephone Encounter (Signed)
LMTCB. Need to find out if pt is still taking hydralazine 25 mg one tablet three times daily and if she is taking fenofibrate 145 mg one tablet daily.

## 2021-11-17 NOTE — Telephone Encounter (Signed)
Patient returned call.  Patient confirmed the following: (1) She is not taking the hydralazine 25 mg one tablet three times daily - Dr Darrick Huntsman discontinued.  (2) She is not  taking fenofibrate 145 mg one tablet daily.  - just the B12 injections.

## 2021-11-17 NOTE — Telephone Encounter (Signed)
noted 

## 2021-11-20 ENCOUNTER — Encounter: Payer: Self-pay | Admitting: Internal Medicine

## 2021-11-23 ENCOUNTER — Encounter: Payer: Self-pay | Admitting: Internal Medicine

## 2021-11-24 ENCOUNTER — Encounter: Payer: Self-pay | Admitting: Family Medicine

## 2021-11-24 MED ORDER — AMLODIPINE BESYLATE 2.5 MG PO TABS: 2.5000 mg | ORAL_TABLET | Freq: Two times a day (BID) | ORAL | 1 refills | Status: DC

## 2021-11-25 MED ORDER — LISDEXAMFETAMINE DIMESYLATE 20 MG PO CAPS: 20.0000 mg | ORAL_CAPSULE | ORAL | 0 refills | Status: DC

## 2021-11-25 NOTE — Addendum Note (Signed)
Addended by: Rodolph Bong on: 11/25/2021 12:49 PM   Modules accepted: Orders

## 2021-11-28 ENCOUNTER — Telehealth: Payer: Self-pay

## 2021-11-28 NOTE — Telephone Encounter (Signed)
Received faxed denial for Vyvance. Patient must try alternative generic meds before this medication will be approved. Patient has sent a mychart message to Dr. [name] in regard to this already.

## 2021-12-01 ENCOUNTER — Ambulatory Visit (INDEPENDENT_AMBULATORY_CARE_PROVIDER_SITE_OTHER): Payer: BC Managed Care – PPO | Admitting: Family Medicine

## 2021-12-01 VITALS — BP 128/78 | HR 86 | Ht 68.0 in | Wt 191.2 lb

## 2021-12-01 DIAGNOSIS — F902 Attention-deficit hyperactivity disorder, combined type: Secondary | ICD-10-CM

## 2021-12-01 DIAGNOSIS — R42 Dizziness and giddiness: Secondary | ICD-10-CM

## 2021-12-01 DIAGNOSIS — S060X0D Concussion without loss of consciousness, subsequent encounter: Secondary | ICD-10-CM | POA: Diagnosis not present

## 2021-12-01 MED ORDER — AMPHETAMINE-DEXTROAMPHET ER 20 MG PO CP24: 20.0000 mg | ORAL_CAPSULE | Freq: Every day | ORAL | 0 refills | Status: DC

## 2021-12-01 NOTE — Patient Instructions (Addendum)
Thank you for coming in today.   Lets try adderall XR 20mg    Try the TENS unit especially with heat.   Recheck in 3 weeks.   OK to schedule a video visit (if you do a video visit try to schedule it for the last one of the morning)   TENS UNIT: This is helpful for muscle pain and spasm.   Search and Purchase a TENS 7000 2nd edition at  www.tenspros.com or www.Amazon.com It should be less than $30.     TENS unit instructions: Do not shower or bathe with the unit on Turn the unit off before removing electrodes or batteries If the electrodes lose stickiness add a drop of water to the electrodes after they are disconnected from the unit and place on plastic sheet. If you continued to have difficulty, call the TENS unit company to purchase more electrodes. Do not apply lotion on the skin area prior to use. Make sure the skin is clean and dry as this will help prolong the life of the electrodes. After use, always check skin for unusual red areas, rash or other skin difficulties. If there are any skin problems, does not apply electrodes to the same area. Never remove the electrodes from the unit by pulling the wires. Do not use the TENS unit or electrodes other than as directed. Do not change electrode placement without consultating your therapist or physician. Keep 2 fingers with between each electrode. Wear time ratio is 2:1, on to off times.    For example on for 30 minutes off for 15 minutes and then on for 30 minutes off for 15 minutes
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
## 2021-12-03 DIAGNOSIS — M542 Cervicalgia: Secondary | ICD-10-CM | POA: Diagnosis not present

## 2021-12-03 DIAGNOSIS — F0781 Postconcussional syndrome: Secondary | ICD-10-CM | POA: Diagnosis not present

## 2021-12-03 DIAGNOSIS — M545 Low back pain, unspecified: Secondary | ICD-10-CM | POA: Diagnosis not present

## 2021-12-09 DIAGNOSIS — F0781 Postconcussional syndrome: Secondary | ICD-10-CM | POA: Diagnosis not present

## 2021-12-09 DIAGNOSIS — M542 Cervicalgia: Secondary | ICD-10-CM | POA: Diagnosis not present

## 2021-12-09 DIAGNOSIS — M545 Low back pain, unspecified: Secondary | ICD-10-CM | POA: Diagnosis not present

## 2021-12-17 DIAGNOSIS — F0781 Postconcussional syndrome: Secondary | ICD-10-CM | POA: Diagnosis not present

## 2021-12-17 DIAGNOSIS — M542 Cervicalgia: Secondary | ICD-10-CM | POA: Diagnosis not present

## 2021-12-17 DIAGNOSIS — M545 Low back pain, unspecified: Secondary | ICD-10-CM | POA: Diagnosis not present

## 2021-12-17 MED ORDER — AMPHETAMINE-DEXTROAMPHET ER 30 MG PO CP24: 30.0000 mg | ORAL_CAPSULE | Freq: Every day | ORAL | 0 refills | Status: DC

## 2021-12-17 NOTE — Addendum Note (Signed)
Addended by: Rodolph Bong on: 12/17/2021 03:45 PM   Modules accepted: Orders

## 2021-12-19 ENCOUNTER — Encounter: Payer: Self-pay | Admitting: Internal Medicine

## 2021-12-22 ENCOUNTER — Ambulatory Visit (INDEPENDENT_AMBULATORY_CARE_PROVIDER_SITE_OTHER): Payer: BC Managed Care – PPO

## 2021-12-22 ENCOUNTER — Ambulatory Visit (INDEPENDENT_AMBULATORY_CARE_PROVIDER_SITE_OTHER): Payer: BC Managed Care – PPO | Admitting: Family Medicine

## 2021-12-22 ENCOUNTER — Other Ambulatory Visit: Payer: Self-pay | Admitting: Internal Medicine

## 2021-12-22 VITALS — BP 122/76 | HR 70 | Ht 68.0 in | Wt 186.0 lb

## 2021-12-22 DIAGNOSIS — M545 Low back pain, unspecified: Secondary | ICD-10-CM

## 2021-12-22 DIAGNOSIS — G8929 Other chronic pain: Secondary | ICD-10-CM | POA: Diagnosis not present

## 2021-12-22 DIAGNOSIS — M25512 Pain in left shoulder: Secondary | ICD-10-CM

## 2021-12-22 DIAGNOSIS — M546 Pain in thoracic spine: Secondary | ICD-10-CM

## 2021-12-22 DIAGNOSIS — F902 Attention-deficit hyperactivity disorder, combined type: Secondary | ICD-10-CM

## 2021-12-22 DIAGNOSIS — S060X0D Concussion without loss of consciousness, subsequent encounter: Secondary | ICD-10-CM | POA: Diagnosis not present

## 2021-12-22 NOTE — Patient Instructions (Addendum)
Thank you for coming in today.   Please get an Xray today before you leave   You should hear from MRI scheduling within 1 week. If you do not hear please let me know.    Let me know if we need to adjust the adderall back down to 20.   Noise blockers?  Based on the MRI I may set you up for back injections based on the results. I may want to talk with you first.   1 month recheck typically. Could be video visit.
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
## 2021-12-23 ENCOUNTER — Ambulatory Visit: Payer: BC Managed Care – PPO | Admitting: Internal Medicine

## 2021-12-23 NOTE — Telephone Encounter (Signed)
I spoke with patient & she stated that she had been recommended by multiple people now to try compression stockings. She will purchase & give them a try.

## 2021-12-24 DIAGNOSIS — F0781 Postconcussional syndrome: Secondary | ICD-10-CM | POA: Diagnosis not present

## 2021-12-24 DIAGNOSIS — M542 Cervicalgia: Secondary | ICD-10-CM | POA: Diagnosis not present

## 2021-12-24 DIAGNOSIS — M545 Low back pain, unspecified: Secondary | ICD-10-CM | POA: Diagnosis not present

## 2021-12-24 NOTE — Progress Notes (Signed)
Left shoulder x-ray looks normal to radiology

## 2021-12-24 NOTE — Progress Notes (Signed)
Thoracic spine x-ray shows a very slight scoliosis and a little bit of arthritis changes but otherwise looks okay to radiology.

## 2022-01-02 ENCOUNTER — Encounter: Payer: Self-pay | Admitting: Family Medicine

## 2022-01-13 ENCOUNTER — Other Ambulatory Visit: Payer: Self-pay

## 2022-01-13 DIAGNOSIS — F0781 Postconcussional syndrome: Secondary | ICD-10-CM | POA: Diagnosis not present

## 2022-01-13 DIAGNOSIS — M542 Cervicalgia: Secondary | ICD-10-CM | POA: Diagnosis not present

## 2022-01-13 DIAGNOSIS — M545 Low back pain, unspecified: Secondary | ICD-10-CM | POA: Diagnosis not present

## 2022-01-13 MED ORDER — CYANOCOBALAMIN 1000 MCG/ML IJ SOLN: 1000.0000 ug | INTRAMUSCULAR | 11 refills | Status: DC

## 2022-01-13 NOTE — Telephone Encounter (Signed)
Historical medication Last OV: 11/12/2021 Next OV: not scheduled

## 2022-01-14 ENCOUNTER — Ambulatory Visit
Admission: RE | Admit: 2022-01-14 | Discharge: 2022-01-14 | Disposition: A | Discharge status: Home / Self Care | Payer: BC Managed Care – PPO | Source: Ambulatory Visit | Attending: Family Medicine | Admitting: Family Medicine

## 2022-01-14 DIAGNOSIS — M4316 Spondylolisthesis, lumbar region: Secondary | ICD-10-CM | POA: Diagnosis not present

## 2022-01-14 DIAGNOSIS — M545 Low back pain, unspecified: Secondary | ICD-10-CM | POA: Diagnosis not present

## 2022-01-14 DIAGNOSIS — G8929 Other chronic pain: Secondary | ICD-10-CM | POA: Insufficient documentation

## 2022-01-19 NOTE — Progress Notes (Signed)
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  This could produce back pain.  There is a little bit of spinal stenosis as well that could produce some pain to go down your legs.  The lower portion of the thoracic spine looks okay to radiology.  We will talk about this result in full detail when you check back as scheduled on the 11th.

## 2022-01-21 ENCOUNTER — Telehealth (INDEPENDENT_AMBULATORY_CARE_PROVIDER_SITE_OTHER): Payer: BC Managed Care – PPO | Admitting: Family Medicine

## 2022-01-21 DIAGNOSIS — M546 Pain in thoracic spine: Secondary | ICD-10-CM | POA: Diagnosis not present

## 2022-01-21 DIAGNOSIS — F902 Attention-deficit hyperactivity disorder, combined type: Secondary | ICD-10-CM

## 2022-01-21 DIAGNOSIS — S060X0D Concussion without loss of consciousness, subsequent encounter: Secondary | ICD-10-CM

## 2022-01-21 DIAGNOSIS — G8929 Other chronic pain: Secondary | ICD-10-CM

## 2022-01-21 DIAGNOSIS — M545 Low back pain, unspecified: Secondary | ICD-10-CM | POA: Diagnosis not present

## 2022-01-21 DIAGNOSIS — M542 Cervicalgia: Secondary | ICD-10-CM | POA: Diagnosis not present

## 2022-01-21 DIAGNOSIS — F0781 Postconcussional syndrome: Secondary | ICD-10-CM | POA: Diagnosis not present

## 2022-01-21 MED ORDER — AMPHETAMINE-DEXTROAMPHET ER 25 MG PO CP24: 25.0000 mg | ORAL_CAPSULE | Freq: Every day | ORAL | 0 refills | Status: DC
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
## 2022-01-22 ENCOUNTER — Encounter: Payer: Self-pay | Admitting: Family Medicine

## 2022-01-29 ENCOUNTER — Ambulatory Visit: Payer: BC Managed Care – PPO

## 2022-02-04 DIAGNOSIS — F0781 Postconcussional syndrome: Secondary | ICD-10-CM | POA: Diagnosis not present

## 2022-02-04 DIAGNOSIS — M542 Cervicalgia: Secondary | ICD-10-CM | POA: Diagnosis not present

## 2022-02-04 DIAGNOSIS — M545 Low back pain, unspecified: Secondary | ICD-10-CM | POA: Diagnosis not present

## 2022-02-05 ENCOUNTER — Ambulatory Visit
Admission: RE | Admit: 2022-02-05 | Discharge: 2022-02-05 | Disposition: A | Discharge status: Home / Self Care | Payer: BC Managed Care – PPO | Source: Ambulatory Visit | Attending: Family Medicine | Admitting: Family Medicine

## 2022-02-05 DIAGNOSIS — M546 Pain in thoracic spine: Secondary | ICD-10-CM | POA: Diagnosis not present

## 2022-02-05 DIAGNOSIS — G8929 Other chronic pain: Secondary | ICD-10-CM | POA: Diagnosis not present

## 2022-02-05 DIAGNOSIS — M5134 Other intervertebral disc degeneration, thoracic region: Secondary | ICD-10-CM | POA: Diagnosis not present

## 2022-02-08 ENCOUNTER — Encounter: Payer: Self-pay | Admitting: Family Medicine

## 2022-02-10 NOTE — Progress Notes (Signed)
MRI thoracic spine shows some arthritis changes in the mid part of your thoracic spine at T7 and T8 region.  However there are no pinched nerves in this area .  It is okay to continue physical therapy.

## 2022-02-20 ENCOUNTER — Ambulatory Visit (INDEPENDENT_AMBULATORY_CARE_PROVIDER_SITE_OTHER): Payer: BC Managed Care – PPO | Admitting: Family Medicine

## 2022-02-20 VITALS — BP 116/78 | HR 68 | Ht 68.0 in | Wt 176.0 lb

## 2022-02-20 DIAGNOSIS — G8929 Other chronic pain: Secondary | ICD-10-CM

## 2022-02-20 DIAGNOSIS — S060X0D Concussion without loss of consciousness, subsequent encounter: Secondary | ICD-10-CM | POA: Diagnosis not present

## 2022-02-20 DIAGNOSIS — F902 Attention-deficit hyperactivity disorder, combined type: Secondary | ICD-10-CM | POA: Diagnosis not present

## 2022-02-20 DIAGNOSIS — M546 Pain in thoracic spine: Secondary | ICD-10-CM | POA: Diagnosis not present

## 2022-02-20 MED ORDER — AMPHETAMINE-DEXTROAMPHET ER 25 MG PO CP24: 25.0000 mg | ORAL_CAPSULE | Freq: Every day | ORAL | 0 refills | Status: DC

## 2022-02-20 NOTE — Patient Instructions (Signed)
Thank you for coming in today.   I think you are getting better.   Keep me updated in a few week to a month. I can write a letter at any time.
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
## 2022-02-25 ENCOUNTER — Ambulatory Visit: Payer: BC Managed Care – PPO | Admitting: Gastroenterology

## 2022-03-04 ENCOUNTER — Encounter: Payer: Self-pay | Admitting: Family Medicine

## 2022-03-22 ENCOUNTER — Other Ambulatory Visit: Payer: Self-pay | Admitting: Internal Medicine

## 2022-03-23 NOTE — Telephone Encounter (Signed)
Refilled: 12/23/2021 Last OV: 11/12/2021 Next OV: not scheduled

## 2022-03-24 ENCOUNTER — Telehealth: Payer: Self-pay | Admitting: Internal Medicine

## 2022-03-24 NOTE — Telephone Encounter (Signed)
LMTCB

## 2022-03-24 NOTE — Telephone Encounter (Signed)
MyChart message sent  : 30 days refill given,  needs OV

## 2022-04-03 ENCOUNTER — Other Ambulatory Visit: Payer: Self-pay | Admitting: Internal Medicine

## 2022-04-03 MED ORDER — METOPROLOL TARTRATE 25 MG PO TABS: 25.0000 mg | ORAL_TABLET | Freq: Two times a day (BID) | ORAL | 0 refills | Status: DC

## 2022-04-08 ENCOUNTER — Emergency Department: Payer: BC Managed Care – PPO

## 2022-04-08 ENCOUNTER — Inpatient Hospital Stay: Payer: BC Managed Care – PPO | Admitting: Anesthesiology

## 2022-04-08 ENCOUNTER — Other Ambulatory Visit: Payer: Self-pay

## 2022-04-08 ENCOUNTER — Inpatient Hospital Stay
Admission: EM | Admit: 2022-04-08 | Discharge: 2022-04-12 | DRG: 326 | Disposition: A | Discharge status: Home / Self Care | Payer: BC Managed Care – PPO | Attending: General Surgery | Admitting: General Surgery

## 2022-04-08 ENCOUNTER — Encounter
Admission: EM | Disposition: A | Discharge status: Home / Self Care | Payer: Self-pay | Source: Home / Self Care | Attending: General Surgery

## 2022-04-08 DIAGNOSIS — R1010 Upper abdominal pain, unspecified: Secondary | ICD-10-CM | POA: Diagnosis not present

## 2022-04-08 DIAGNOSIS — K631 Perforation of intestine (nontraumatic): Secondary | ICD-10-CM

## 2022-04-08 DIAGNOSIS — R1084 Generalized abdominal pain: Secondary | ICD-10-CM | POA: Diagnosis not present

## 2022-04-08 DIAGNOSIS — K660 Peritoneal adhesions (postprocedural) (postinfection): Secondary | ICD-10-CM | POA: Diagnosis not present

## 2022-04-08 DIAGNOSIS — Z8249 Family history of ischemic heart disease and other diseases of the circulatory system: Secondary | ICD-10-CM | POA: Diagnosis not present

## 2022-04-08 DIAGNOSIS — R100 Acute abdomen: Principal | ICD-10-CM

## 2022-04-08 DIAGNOSIS — Z87891 Personal history of nicotine dependence: Secondary | ICD-10-CM | POA: Diagnosis not present

## 2022-04-08 DIAGNOSIS — K3189 Other diseases of stomach and duodenum: Secondary | ICD-10-CM

## 2022-04-08 DIAGNOSIS — Z8261 Family history of arthritis: Secondary | ICD-10-CM

## 2022-04-08 DIAGNOSIS — K265 Chronic or unspecified duodenal ulcer with perforation: Principal | ICD-10-CM | POA: Diagnosis present

## 2022-04-08 DIAGNOSIS — Z9049 Acquired absence of other specified parts of digestive tract: Secondary | ICD-10-CM | POA: Diagnosis not present

## 2022-04-08 DIAGNOSIS — R109 Unspecified abdominal pain: Secondary | ICD-10-CM | POA: Diagnosis not present

## 2022-04-08 DIAGNOSIS — E785 Hyperlipidemia, unspecified: Secondary | ICD-10-CM | POA: Diagnosis not present

## 2022-04-08 DIAGNOSIS — I10 Essential (primary) hypertension: Secondary | ICD-10-CM | POA: Diagnosis not present

## 2022-04-08 DIAGNOSIS — M797 Fibromyalgia: Secondary | ICD-10-CM | POA: Diagnosis present

## 2022-04-08 DIAGNOSIS — K2289 Other specified disease of esophagus: Secondary | ICD-10-CM | POA: Diagnosis not present

## 2022-04-08 DIAGNOSIS — I700 Atherosclerosis of aorta: Secondary | ICD-10-CM | POA: Diagnosis not present

## 2022-04-08 DIAGNOSIS — K651 Peritoneal abscess: Secondary | ICD-10-CM | POA: Diagnosis not present

## 2022-04-08 DIAGNOSIS — F418 Other specified anxiety disorders: Secondary | ICD-10-CM | POA: Diagnosis not present

## 2022-04-08 DIAGNOSIS — R1013 Epigastric pain: Secondary | ICD-10-CM | POA: Diagnosis not present

## 2022-04-08 DIAGNOSIS — Z885 Allergy status to narcotic agent status: Secondary | ICD-10-CM

## 2022-04-08 DIAGNOSIS — T8143XA Infection following a procedure, organ and space surgical site, initial encounter: Secondary | ICD-10-CM | POA: Diagnosis not present

## 2022-04-08 DIAGNOSIS — K261 Acute duodenal ulcer with perforation: Secondary | ICD-10-CM | POA: Diagnosis not present

## 2022-04-08 HISTORY — PX: XI ROBOT ASSISTED DIAGNOSTIC LAPAROSCOPY: SHX6815

## 2022-04-08 HISTORY — DX: Other diseases of stomach and duodenum: K31.89

## 2022-04-08 LAB — COMPREHENSIVE METABOLIC PANEL
ALT: 15 U/L (ref 0–44)
AST: 20 U/L (ref 15–41)
Albumin: 4 g/dL (ref 3.5–5.0)
Alkaline Phosphatase: 62 U/L (ref 38–126)
Anion gap: 6 (ref 5–15)
BUN: 17 mg/dL (ref 6–20)
CO2: 27 mmol/L (ref 22–32)
Calcium: 9 mg/dL (ref 8.9–10.3)
Chloride: 104 mmol/L (ref 98–111)
Creatinine, Ser: 1.06 mg/dL — ABNORMAL HIGH (ref 0.44–1.00)
GFR, Estimated: >60 mL/min (ref >60)
Glucose, Bld: 152 mg/dL — ABNORMAL HIGH (ref 70–99)
Potassium: 3.6 mmol/L (ref 3.5–5.1)
Sodium: 137 mmol/L (ref 135–145)
Total Bilirubin: 0.9 mg/dL (ref 0.3–1.2)
Total Protein: 6.7 g/dL (ref 6.5–8.1)

## 2022-04-08 LAB — CBC
HCT: 34.1 % — ABNORMAL LOW (ref 36.0–46.0)
Hemoglobin: 11.5 g/dL — ABNORMAL LOW (ref 12.0–15.0)
MCH: 32.8 pg (ref 26.0–34.0)
MCHC: 33.7 g/dL (ref 30.0–36.0)
MCV: 97.2 fL (ref 80.0–100.0)
Platelets: 344 10*3/uL (ref 150–400)
RBC: 3.51 MIL/uL — ABNORMAL LOW (ref 3.87–5.11)
RDW: 11.9 % (ref 11.5–15.5)
WBC: 7 10*3/uL (ref 4.0–10.5)
nRBC: 0 % (ref 0.0–0.2)

## 2022-04-08 LAB — TYPE AND SCREEN
ABO/RH(D): A POS
Antibody Screen: NEGATIVE

## 2022-04-08 LAB — LIPASE, BLOOD: Lipase: 60 U/L — ABNORMAL HIGH (ref 11–51)

## 2022-04-08 LAB — LACTIC ACID, PLASMA: Lactic Acid, Venous: 1.3 mmol/L (ref 0.5–1.9)

## 2022-04-08 SURGERY — LAPAROSCOPY, DIAGNOSTIC, ROBOT-ASSISTED
Anesthesia: General | Site: Abdomen

## 2022-04-08 MED ORDER — CYANOCOBALAMIN 1000 MCG/ML IJ SOLN: 1000.0000 ug | INTRAMUSCULAR | Status: DC

## 2022-04-08 MED ORDER — ONDANSETRON 4 MG PO TBDP: 4.0000 mg | ORAL_TABLET | Freq: Four times a day (QID) | ORAL | Status: DC | PRN

## 2022-04-08 MED ORDER — LOSARTAN POTASSIUM 50 MG PO TABS: 100.0000 mg | ORAL_TABLET | Freq: Every morning | ORAL | Status: DC

## 2022-04-08 MED ORDER — IOHEXOL 300 MG/ML  SOLN
100.0000 mL | Freq: Once | INTRAMUSCULAR | Status: AC | PRN
  Administered 2022-04-08: 100 mL via INTRAVENOUS

## 2022-04-08 MED ORDER — ONDANSETRON HCL 4 MG/2ML IJ SOLN
INTRAMUSCULAR | Status: AC
  Filled 2022-04-08: qty 2

## 2022-04-08 MED ORDER — ACETAMINOPHEN 10 MG/ML IV SOLN
INTRAVENOUS | Status: DC | PRN
  Administered 2022-04-08: 1000 mg via INTRAVENOUS

## 2022-04-08 MED ORDER — PHENYLEPHRINE 80 MCG/ML (10ML) SYRINGE FOR IV PUSH (FOR BLOOD PRESSURE SUPPORT)
PREFILLED_SYRINGE | INTRAVENOUS | Status: AC
  Filled 2022-04-08: qty 20

## 2022-04-08 MED ORDER — OXYCODONE HCL 5 MG PO TABS: 5.0000 mg | ORAL_TABLET | Freq: Once | ORAL | Status: DC | PRN

## 2022-04-08 MED ORDER — PANTOPRAZOLE SODIUM 40 MG IV SOLR
40.0000 mg | Freq: Two times a day (BID) | INTRAVENOUS | Status: DC
  Administered 2022-04-09 – 2022-04-12 (×8): 40 mg via INTRAVENOUS
  Filled 2022-04-08 (×8): qty 10

## 2022-04-08 MED ORDER — PROPOFOL 1000 MG/100ML IV EMUL
INTRAVENOUS | Status: AC
  Filled 2022-04-08: qty 100

## 2022-04-08 MED ORDER — DIPHENHYDRAMINE HCL 25 MG PO CAPS: 25.0000 mg | ORAL_CAPSULE | Freq: Four times a day (QID) | ORAL | Status: DC | PRN

## 2022-04-08 MED ORDER — MORPHINE SULFATE (PF) 4 MG/ML IV SOLN: 4.0000 mg | INTRAVENOUS | Status: DC | PRN

## 2022-04-08 MED ORDER — DEXAMETHASONE SODIUM PHOSPHATE 10 MG/ML IJ SOLN
INTRAMUSCULAR | Status: DC | PRN
  Administered 2022-04-08: 10 mg via INTRAVENOUS

## 2022-04-08 MED ORDER — EPHEDRINE SULFATE (PRESSORS) 50 MG/ML IJ SOLN
INTRAMUSCULAR | Status: DC | PRN
  Administered 2022-04-08: 10 mg via INTRAVENOUS

## 2022-04-08 MED ORDER — FENTANYL CITRATE PF 50 MCG/ML IJ SOSY
50.0000 ug | PREFILLED_SYRINGE | Freq: Once | INTRAMUSCULAR | Status: AC
  Administered 2022-04-08: 50 ug via INTRAVENOUS

## 2022-04-08 MED ORDER — PIPERACILLIN-TAZOBACTAM 3.375 G IVPB: 3.3750 g | Freq: Three times a day (TID) | INTRAVENOUS | Status: DC

## 2022-04-08 MED ORDER — BUPIVACAINE-EPINEPHRINE (PF) 0.25% -1:200000 IJ SOLN
INTRAMUSCULAR | Status: AC
  Filled 2022-04-08: qty 30

## 2022-04-08 MED ORDER — PANTOPRAZOLE SODIUM 40 MG IV SOLR
40.0000 mg | Freq: Once | INTRAVENOUS | Status: AC
  Administered 2022-04-08: 40 mg via INTRAVENOUS
  Filled 2022-04-08: qty 10

## 2022-04-08 MED ORDER — ONDANSETRON HCL 4 MG/2ML IJ SOLN
4.0000 mg | Freq: Once | INTRAMUSCULAR | Status: AC
  Administered 2022-04-08: 4 mg via INTRAVENOUS
  Filled 2022-04-08: qty 2

## 2022-04-08 MED ORDER — PROPOFOL 500 MG/50ML IV EMUL
INTRAVENOUS | Status: DC | PRN
  Administered 2022-04-08: 150 ug/kg/min via INTRAVENOUS

## 2022-04-08 MED ORDER — OXYCODONE-ACETAMINOPHEN 5-325 MG PO TABS
1.0000 | ORAL_TABLET | Freq: Once | ORAL | Status: AC
  Administered 2022-04-08: 1 via ORAL
  Filled 2022-04-08: qty 1

## 2022-04-08 MED ORDER — LIDOCAINE HCL (CARDIAC) PF 100 MG/5ML IV SOSY
PREFILLED_SYRINGE | INTRAVENOUS | Status: DC | PRN
  Administered 2022-04-08: 80 mg via INTRAVENOUS

## 2022-04-08 MED ORDER — KETAMINE HCL 10 MG/ML IJ SOLN
INTRAMUSCULAR | Status: DC | PRN
  Administered 2022-04-08: 20 mg via INTRAVENOUS
  Administered 2022-04-08: 10 mg via INTRAVENOUS
  Administered 2022-04-08: 20 mg via INTRAVENOUS

## 2022-04-08 MED ORDER — SODIUM CHLORIDE 0.9 % IV SOLN: INTRAVENOUS | Status: DC | PRN

## 2022-04-08 MED ORDER — FENTANYL CITRATE PF 50 MCG/ML IJ SOSY
50.0000 ug | PREFILLED_SYRINGE | Freq: Once | INTRAMUSCULAR | Status: AC
  Administered 2022-04-08: 50 ug via INTRAVENOUS
  Filled 2022-04-08: qty 1

## 2022-04-08 MED ORDER — FENTANYL CITRATE PF 50 MCG/ML IJ SOSY
PREFILLED_SYRINGE | INTRAMUSCULAR | Status: AC
  Filled 2022-04-08: qty 1

## 2022-04-08 MED ORDER — ROCURONIUM BROMIDE 100 MG/10ML IV SOLN
INTRAVENOUS | Status: DC | PRN
  Administered 2022-04-08: 20 mg via INTRAVENOUS
  Administered 2022-04-08: 50 mg via INTRAVENOUS
  Administered 2022-04-08: 15 mg via INTRAVENOUS

## 2022-04-08 MED ORDER — ROCURONIUM BROMIDE 10 MG/ML (PF) SYRINGE
PREFILLED_SYRINGE | INTRAVENOUS | Status: AC
  Filled 2022-04-08: qty 10

## 2022-04-08 MED ORDER — ENOXAPARIN SODIUM 40 MG/0.4ML IJ SOSY: 40.0000 mg | PREFILLED_SYRINGE | Freq: Every day | INTRAMUSCULAR | Status: DC

## 2022-04-08 MED ORDER — CITALOPRAM HYDROBROMIDE 40 MG PO TABS: 40.0000 mg | ORAL_TABLET | Freq: Every morning | ORAL | Status: DC

## 2022-04-08 MED ORDER — LIDOCAINE HCL (PF) 2 % IJ SOLN
INTRAMUSCULAR | Status: AC
  Filled 2022-04-08: qty 5

## 2022-04-08 MED ORDER — TRAMADOL HCL 50 MG PO TABS: 50.0000 mg | ORAL_TABLET | Freq: Four times a day (QID) | ORAL | Status: DC | PRN

## 2022-04-08 MED ORDER — ACETAMINOPHEN 325 MG PO TABS: 650.0000 mg | ORAL_TABLET | Freq: Four times a day (QID) | ORAL | Status: DC | PRN

## 2022-04-08 MED ORDER — PIPERACILLIN-TAZOBACTAM 3.375 G IVPB
3.3750 g | Freq: Three times a day (TID) | INTRAVENOUS | Status: DC
  Administered 2022-04-09 – 2022-04-12 (×12): 3.375 g via INTRAVENOUS
  Filled 2022-04-08 (×12): qty 50

## 2022-04-08 MED ORDER — 0.9 % SODIUM CHLORIDE (POUR BTL) OPTIME
TOPICAL | Status: DC | PRN
  Administered 2022-04-08: 2280 mL

## 2022-04-08 MED ORDER — DIAZEPAM 5 MG PO TABS: 5.0000 mg | ORAL_TABLET | Freq: Two times a day (BID) | ORAL | Status: DC | PRN

## 2022-04-08 MED ORDER — FENTANYL CITRATE (PF) 100 MCG/2ML IJ SOLN
INTRAMUSCULAR | Status: DC | PRN
  Administered 2022-04-08 (×2): 50 ug via INTRAVENOUS

## 2022-04-08 MED ORDER — ONDANSETRON HCL 4 MG/2ML IJ SOLN
INTRAMUSCULAR | Status: DC | PRN
  Administered 2022-04-08: 4 mg via INTRAVENOUS

## 2022-04-08 MED ORDER — FENTANYL CITRATE (PF) 100 MCG/2ML IJ SOLN
INTRAMUSCULAR | Status: AC
  Administered 2022-04-08: 50 ug via INTRAVENOUS
  Filled 2022-04-08: qty 2

## 2022-04-08 MED ORDER — FENTANYL CITRATE (PF) 100 MCG/2ML IJ SOLN
25.0000 ug | INTRAMUSCULAR | Status: DC | PRN
  Administered 2022-04-08 (×2): 25 ug via INTRAVENOUS

## 2022-04-08 MED ORDER — PROPOFOL 10 MG/ML IV BOLUS
INTRAVENOUS | Status: DC | PRN
  Administered 2022-04-08: 120 mg via INTRAVENOUS

## 2022-04-08 MED ORDER — ONDANSETRON HCL 4 MG/2ML IJ SOLN: 4.0000 mg | Freq: Once | INTRAMUSCULAR | Status: DC | PRN

## 2022-04-08 MED ORDER — SODIUM CHLORIDE 0.9 % IV SOLN: INTRAVENOUS | Status: DC

## 2022-04-08 MED ORDER — ACETAMINOPHEN 10 MG/ML IV SOLN
INTRAVENOUS | Status: AC
  Filled 2022-04-08: qty 100

## 2022-04-08 MED ORDER — SUCCINYLCHOLINE CHLORIDE 200 MG/10ML IV SOSY
PREFILLED_SYRINGE | INTRAVENOUS | Status: DC | PRN
  Administered 2022-04-08: 100 mg via INTRAVENOUS

## 2022-04-08 MED ORDER — HYDROCODONE-ACETAMINOPHEN 5-325 MG PO TABS
1.0000 | ORAL_TABLET | ORAL | Status: DC | PRN
  Administered 2022-04-10 (×2): 2
  Administered 2022-04-11: 1
  Administered 2022-04-11: 2
  Administered 2022-04-11: 1
  Administered 2022-04-12 (×3): 2
  Filled 2022-04-08 (×5): qty 2
  Filled 2022-04-08: qty 1
  Filled 2022-04-08: qty 2
  Filled 2022-04-08: qty 1

## 2022-04-08 MED ORDER — SODIUM CHLORIDE 0.9 % IV BOLUS
500.0000 mL | Freq: Once | INTRAVENOUS | Status: AC
  Administered 2022-04-08: 500 mL via INTRAVENOUS

## 2022-04-08 MED ORDER — CEFAZOLIN SODIUM-DEXTROSE 2-3 GM-%(50ML) IV SOLR
INTRAVENOUS | Status: DC | PRN
  Administered 2022-04-08: 2 g via INTRAVENOUS

## 2022-04-08 MED ORDER — SUCCINYLCHOLINE CHLORIDE 200 MG/10ML IV SOSY
PREFILLED_SYRINGE | INTRAVENOUS | Status: AC
  Filled 2022-04-08: qty 10

## 2022-04-08 MED ORDER — ACETAMINOPHEN 10 MG/ML IV SOLN: 1000.0000 mg | Freq: Once | INTRAVENOUS | Status: DC | PRN

## 2022-04-08 MED ORDER — PHENYLEPHRINE HCL (PRESSORS) 10 MG/ML IV SOLN
INTRAVENOUS | Status: DC | PRN
  Administered 2022-04-08 (×3): 160 ug via INTRAVENOUS
  Administered 2022-04-08: 80 ug via INTRAVENOUS
  Administered 2022-04-08 (×5): 160 ug via INTRAVENOUS

## 2022-04-08 MED ORDER — PIPERACILLIN-TAZOBACTAM 3.375 G IVPB 30 MIN
3.3750 g | Freq: Once | INTRAVENOUS | Status: AC
  Administered 2022-04-08: 3.375 g via INTRAVENOUS
  Filled 2022-04-08: qty 50

## 2022-04-08 MED ORDER — HYDROCHLOROTHIAZIDE 25 MG PO TABS: 25.0000 mg | ORAL_TABLET | Freq: Every day | ORAL | Status: DC

## 2022-04-08 MED ORDER — SUGAMMADEX SODIUM 200 MG/2ML IV SOLN
INTRAVENOUS | Status: DC | PRN
  Administered 2022-04-08: 200 mg via INTRAVENOUS

## 2022-04-08 MED ORDER — DIPHENHYDRAMINE HCL 50 MG/ML IJ SOLN: 25.0000 mg | Freq: Four times a day (QID) | INTRAMUSCULAR | Status: DC | PRN

## 2022-04-08 MED ORDER — LACTATED RINGERS IV SOLN: INTRAVENOUS | Status: DC | PRN

## 2022-04-08 MED ORDER — BUSPIRONE HCL 10 MG PO TABS: 10.0000 mg | ORAL_TABLET | Freq: Two times a day (BID) | ORAL | Status: DC

## 2022-04-08 MED ORDER — ONDANSETRON HCL 4 MG/2ML IJ SOLN: 4.0000 mg | Freq: Four times a day (QID) | INTRAMUSCULAR | Status: DC | PRN

## 2022-04-08 MED ORDER — AMLODIPINE BESYLATE 5 MG PO TABS: 2.5000 mg | ORAL_TABLET | Freq: Two times a day (BID) | ORAL | Status: DC

## 2022-04-08 MED ORDER — ENOXAPARIN SODIUM 40 MG/0.4ML IJ SOSY
40.0000 mg | PREFILLED_SYRINGE | INTRAMUSCULAR | Status: DC
  Administered 2022-04-09 – 2022-04-11 (×3): 40 mg via SUBCUTANEOUS
  Filled 2022-04-08 (×3): qty 0.4

## 2022-04-08 MED ORDER — BUPIVACAINE HCL (PF) 0.5 % IJ SOLN
INTRAMUSCULAR | Status: DC | PRN
  Administered 2022-04-08: 7 mL

## 2022-04-08 MED ORDER — PANTOPRAZOLE SODIUM 40 MG IV SOLR: 40.0000 mg | Freq: Every day | INTRAVENOUS | Status: DC

## 2022-04-08 MED ORDER — METOPROLOL TARTRATE 25 MG PO TABS: 25.0000 mg | ORAL_TABLET | Freq: Two times a day (BID) | ORAL | Status: DC

## 2022-04-08 MED ORDER — B-12 100-5000 MCG SL SUBL: SUBLINGUAL_TABLET | Freq: Every day | SUBLINGUAL | Status: DC

## 2022-04-08 MED ORDER — MORPHINE SULFATE (PF) 4 MG/ML IV SOLN
4.0000 mg | INTRAVENOUS | Status: DC | PRN
  Administered 2022-04-09 – 2022-04-10 (×5): 4 mg via INTRAVENOUS
  Filled 2022-04-08 (×5): qty 1

## 2022-04-08 MED ORDER — DEXAMETHASONE SODIUM PHOSPHATE 10 MG/ML IJ SOLN
INTRAMUSCULAR | Status: AC
  Filled 2022-04-08: qty 1

## 2022-04-08 MED ORDER — MIDAZOLAM HCL 2 MG/2ML IJ SOLN
INTRAMUSCULAR | Status: AC
  Filled 2022-04-08: qty 2

## 2022-04-08 MED ORDER — AMPHETAMINE-DEXTROAMPHET ER 25 MG PO CP24: 25.0000 mg | ORAL_CAPSULE | Freq: Every day | ORAL | Status: DC

## 2022-04-08 MED ORDER — MIDAZOLAM HCL 2 MG/2ML IJ SOLN
INTRAMUSCULAR | Status: DC | PRN
  Administered 2022-04-08: 2 mg via INTRAVENOUS

## 2022-04-08 MED ORDER — FENTANYL CITRATE PF 50 MCG/ML IJ SOSY
75.0000 ug | PREFILLED_SYRINGE | Freq: Once | INTRAMUSCULAR | Status: AC
  Administered 2022-04-08: 75 ug via INTRAVENOUS
  Filled 2022-04-08: qty 2

## 2022-04-08 MED ORDER — DIPHENHYDRAMINE HCL 12.5 MG/5ML PO ELIX: 12.5000 mg | ORAL_SOLUTION | Freq: Four times a day (QID) | ORAL | Status: DC | PRN

## 2022-04-08 MED ORDER — DIPHENHYDRAMINE HCL 50 MG/ML IJ SOLN
12.5000 mg | Freq: Four times a day (QID) | INTRAMUSCULAR | Status: DC | PRN
  Administered 2022-04-09 – 2022-04-12 (×9): 12.5 mg via INTRAVENOUS
  Filled 2022-04-08 (×11): qty 1

## 2022-04-08 MED ORDER — OXYCODONE HCL 5 MG/5ML PO SOLN: 5.0000 mg | Freq: Once | ORAL | Status: DC | PRN

## 2022-04-08 MED ORDER — FENTANYL CITRATE (PF) 100 MCG/2ML IJ SOLN
INTRAMUSCULAR | Status: AC
  Filled 2022-04-08: qty 2

## 2022-04-08 MED ORDER — ACETAMINOPHEN 650 MG RE SUPP: 650.0000 mg | Freq: Four times a day (QID) | RECTAL | Status: DC | PRN

## 2022-04-08 MED ORDER — HYDROCODONE-ACETAMINOPHEN 5-325 MG PO TABS: 1.0000 | ORAL_TABLET | ORAL | Status: DC | PRN

## 2022-04-08 MED ORDER — TRAZODONE HCL 50 MG PO TABS: 75.0000 mg | ORAL_TABLET | Freq: Every day | ORAL | Status: DC

## 2022-04-08 MED ORDER — KETAMINE HCL 50 MG/5ML IJ SOSY
PREFILLED_SYRINGE | INTRAMUSCULAR | Status: AC
  Filled 2022-04-08: qty 5

## 2022-04-08 SURGICAL SUPPLY — 89 items
BLADE CLIPPER SURG (BLADE) ×1 IMPLANT
BLADE SURG SZ10 CARB STEEL (BLADE) ×1 IMPLANT
BLADE SURG SZ11 CARB STEEL (BLADE) ×1 IMPLANT
BULB RESERV EVAC DRAIN JP 100C (MISCELLANEOUS) IMPLANT
CANNULA REDUC XI 12-8 STAPL (CANNULA) ×1
CANNULA REDUCER 12-8 DVNC XI (CANNULA) ×1 IMPLANT
COVER TIP SHEARS 8 DVNC (MISCELLANEOUS) ×1 IMPLANT
COVER TIP SHEARS 8MM DA VINCI (MISCELLANEOUS) ×1
DERMABOND ADVANCED .7 DNX12 (GAUZE/BANDAGES/DRESSINGS) ×1 IMPLANT
DRAIN CHANNEL JP 15F RND 16 (MISCELLANEOUS) IMPLANT
DRAPE ARM DVNC X/XI (DISPOSABLE) ×4 IMPLANT
DRAPE COLUMN DVNC XI (DISPOSABLE) ×1 IMPLANT
DRAPE DA VINCI XI ARM (DISPOSABLE) ×4
DRAPE DA VINCI XI COLUMN (DISPOSABLE) ×1
DRSG OPSITE POSTOP 4X10 (GAUZE/BANDAGES/DRESSINGS) IMPLANT
DRSG OPSITE POSTOP 4X8 (GAUZE/BANDAGES/DRESSINGS) IMPLANT
ELECT BLADE 6.5 EXT (BLADE) IMPLANT
ELECT CAUTERY BLADE 6.4 (BLADE) IMPLANT
ELECT REM PT RETURN 9FT ADLT (ELECTROSURGICAL) ×1
ELECTRODE REM PT RTRN 9FT ADLT (ELECTROSURGICAL) ×1 IMPLANT
GLOVE BIO SURGEON STRL SZ 6.5 (GLOVE) ×3 IMPLANT
GLOVE BIOGEL PI IND STRL 6.5 (GLOVE) ×3 IMPLANT
GOWN STRL REUS W/ TWL LRG LVL3 (GOWN DISPOSABLE) ×6 IMPLANT
GOWN STRL REUS W/TWL LRG LVL3 (GOWN DISPOSABLE) ×6
GRASPER SUT TROCAR 14GX15 (MISCELLANEOUS) IMPLANT
HANDLE YANKAUER SUCT BULB TIP (MISCELLANEOUS) ×1 IMPLANT
IRRIGATION STRYKERFLOW (MISCELLANEOUS) IMPLANT
IRRIGATOR STRYKERFLOW (MISCELLANEOUS)
IRRIGATOR SUCT 8 DISP DVNC XI (IRRIGATION / IRRIGATOR) IMPLANT
IRRIGATOR SUCTION 8MM XI DISP (IRRIGATION / IRRIGATOR)
IV NS 1000ML (IV SOLUTION)
IV NS 1000ML BAXH (IV SOLUTION) IMPLANT
KIT IMAGING PINPOINTPAQ (MISCELLANEOUS) IMPLANT
KIT PINK PAD W/HEAD ARE REST (MISCELLANEOUS) ×1 IMPLANT
KIT PINK PAD W/HEAD ARM REST (MISCELLANEOUS) ×1 IMPLANT
LABEL OR SOLS (LABEL) IMPLANT
MANIFOLD NEPTUNE II (INSTRUMENTS) ×1 IMPLANT
NDL INSUFFLATION 14GA 120MM (NEEDLE) ×1 IMPLANT
NEEDLE HYPO 22GX1.5 SAFETY (NEEDLE) ×1 IMPLANT
NEEDLE INSUFFLATION 14GA 120MM (NEEDLE) ×1 IMPLANT
OBTURATOR OPTICAL STANDARD 8MM (TROCAR) ×1
OBTURATOR OPTICAL STND 8 DVNC (TROCAR) ×1
OBTURATOR OPTICALSTD 8 DVNC (TROCAR) ×1 IMPLANT
PACK COLON CLEAN CLOSURE (MISCELLANEOUS) ×1 IMPLANT
PACK LAP CHOLECYSTECTOMY (MISCELLANEOUS) ×1 IMPLANT
PORT ACCESS TROCAR AIRSEAL 5 (TROCAR) ×1 IMPLANT
RELOAD STAPLE 45 3.5 BLU DVNC (STAPLE) IMPLANT
RELOAD STAPLE 60 3.5 BLU DVNC (STAPLE) IMPLANT
RELOAD STAPLER 3.5X45 BLU DVNC (STAPLE) IMPLANT
RELOAD STAPLER 3.5X60 BLU DVNC (STAPLE) IMPLANT
RETRACTOR WOUND ALXS 18CM SML (MISCELLANEOUS) IMPLANT
RTRCTR WOUND ALEXIS O 18CM SML (MISCELLANEOUS)
SEAL CANN UNIV 5-8 DVNC XI (MISCELLANEOUS) ×3 IMPLANT
SEAL XI 5MM-8MM UNIVERSAL (MISCELLANEOUS) ×3
SEALER VESSEL DA VINCI XI (MISCELLANEOUS)
SEALER VESSEL EXT DVNC XI (MISCELLANEOUS) IMPLANT
SET TRI-LUMEN FLTR TB AIRSEAL (TUBING) ×1 IMPLANT
SOLUTION ELECTROLUBE (MISCELLANEOUS) ×1 IMPLANT
SPONGE T-LAP 18X18 ~~LOC~~+RFID (SPONGE) ×1 IMPLANT
SPONGE T-LAP 4X18 ~~LOC~~+RFID (SPONGE) ×1 IMPLANT
STAPLER 45 DA VINCI SURE FORM (STAPLE)
STAPLER 45 SUREFORM DVNC (STAPLE) IMPLANT
STAPLER 60 DA VINCI SURE FORM (STAPLE)
STAPLER 60 SUREFORM DVNC (STAPLE) IMPLANT
STAPLER CANNULA SEAL DVNC XI (STAPLE) ×1 IMPLANT
STAPLER CANNULA SEAL XI (STAPLE) ×1
STAPLER RELOAD 3.5X45 BLU DVNC (STAPLE)
STAPLER RELOAD 3.5X45 BLUE (STAPLE)
STAPLER RELOAD 3.5X60 BLU DVNC (STAPLE)
STAPLER RELOAD 3.5X60 BLUE (STAPLE)
STAPLER SKIN PROX 35W (STAPLE) IMPLANT
SUT ETHILON 3-0 FS-10 30 BLK (SUTURE) ×1
SUT MNCRL 4-0 (SUTURE) ×2
SUT MNCRL 4-0 27XMFL (SUTURE) ×2
SUT SILK 2 0 SH (SUTURE) IMPLANT
SUT SILK 3 0 SH 30 (SUTURE) IMPLANT
SUT STRATAFIX 0 PDS+ CT-2 23 (SUTURE) ×1
SUT VIC AB 3-0 SH 27 (SUTURE) ×1
SUT VIC AB 3-0 SH 27X BRD (SUTURE) ×1 IMPLANT
SUT VICRYL 0 AB UR-6 (SUTURE) IMPLANT
SUT VICRYL 0 UR6 27IN ABS (SUTURE) IMPLANT
SUT VLOC 90 6 CV-15 VIOLET (SUTURE) ×1 IMPLANT
SUTURE EHLN 3-0 FS-10 30 BLK (SUTURE) IMPLANT
SUTURE MNCRL 4-0 27XMF (SUTURE) ×2 IMPLANT
SUTURE STRATFX 0 PDS+ CT-2 23 (SUTURE) ×1 IMPLANT
SYR 30ML LL (SYRINGE) ×1 IMPLANT
TRAP FLUID SMOKE EVACUATOR (MISCELLANEOUS) ×1 IMPLANT
TRAY FOLEY MTR SLVR 16FR STAT (SET/KITS/TRAYS/PACK) ×1 IMPLANT
WATER STERILE IRR 500ML POUR (IV SOLUTION) ×1 IMPLANT

## 2022-04-08 NOTE — Transfer of Care (Signed)
Immediate Anesthesia Transfer of Care Note  Patient: Allison Alvarez  Procedure(s) Performed: XI ROBOT ASSISTED DUODENAL ULCER PERFORATION REPAIR (Abdomen)  Patient Location: PACU  Anesthesia Type:General  Level of Consciousness: awake, alert , and oriented  Airway & Oxygen Therapy: Patient Spontanous Breathing  Post-op Assessment: Report given to RN and Post -op Vital signs reviewed and stable  Post vital signs: Reviewed and stable  Last Vitals:  Vitals Value Taken Time  BP 108/93   Temp    Pulse 81   Resp 19   SpO2 95     Last Pain:  Vitals:   04/08/22 1534  TempSrc: Temporal  PainSc: 6          Complications: No notable events documented.

## 2022-04-08 NOTE — Anesthesia Procedure Notes (Signed)
Procedure Name: Intubation Date/Time: 04/08/2022 6:07 PM  Performed by: Irving Burton, CRNAPre-anesthesia Checklist: Patient identified, Patient being monitored, Timeout performed, Emergency Drugs available and Suction available Patient Re-evaluated:Patient Re-evaluated prior to induction Oxygen Delivery Method: Circle system utilized Preoxygenation: Pre-oxygenation with 100% oxygen Induction Type: IV induction and Rapid sequence Laryngoscope Size: 3 and McGraph Grade View: Grade I Tube type: Oral Tube size: 7.0 mm Number of attempts: 1 Airway Equipment and Method: Stylet and Video-laryngoscopy Placement Confirmation: ETT inserted through vocal cords under direct vision, positive ETCO2 and breath sounds checked- equal and bilateral Secured at: 22 cm Tube secured with: Tape Dental Injury: Teeth and Oropharynx as per pre-operative assessment

## 2022-04-08 NOTE — H&P (Signed)
SURGICAL HISTORY AND PHYSICAL NOTE   HISTORY OF PRESENT ILLNESS (HPI):  61 y.o. female presented to [facility] ED for evaluation of abdominal pain since today at 11 AM. Patient reports severe generalized abdominal pain that radiated to her right shoulder and back.  Pain aggravated by movement.  No alleviating factors.  Endorses nausea and vomiting.  Denies fever or chills.  At the ED she was found with normal white blood cell count.  No significant electrolyte disturbance.  CT scan of the abdomen and pelvis shows abundant amount of free fluid with free air.  Intestinal perforation most likely from perforated ulcers involving the proximal duodenum.  I personally evaluated the images.  Surgery is consulted by Dr. Jacqualine Code in this context for evaluation and management of perforation of intestine.  PAST MEDICAL HISTORY (PMH):  Past Medical History:  Diagnosis Date   Allergy    Anxiety    Arthritis    Depression    Diverticulitis    Fibromyalgia    Hyperlipidemia    Hypertension    PONV (postoperative nausea and vomiting)    PVC (premature ventricular contraction)      PAST SURGICAL HISTORY ([place]):  Past Surgical History:  Procedure Laterality Date   ABDOMINAL HYSTERECTOMY  2007   APPENDECTOMY  2008   BLADDER REPAIR  2008   CHOLECYSTECTOMY     COLON RESECTION  11/2020   COLONOSCOPY     07/2016, 07/2020   ESOPHAGOGASTRODUODENOSCOPY  07/2020   TUBAL LIGATION  1996   VESICOVAGINAL FISTULA CLOSURE  2008     MEDICATIONS:  Prior to Admission medications   Medication Sig Start Date End Date Taking? Authorizing Provider  acetaminophen (TYLENOL) 325 MG tablet Take 650 mg by mouth every 6 (six) hours as needed for moderate pain.    [provider]  amLODipine (NORVASC) 2.5 MG tablet Take 1 tablet (2.5 mg total) by mouth 2 (two) times daily. 11/24/21   Crecencio Mc, MD  amphetamine-dextroamphetamine (ADDERALL XR) 25 MG 24 hr capsule Take 1 capsule by mouth daily. 02/20/22   Gregor Hams, MD  busPIRone (BUSPAR) 10 MG tablet Take 10 mg by mouth 2 (two) times daily. 07/30/20   [provider]  citalopram (CELEXA) 40 MG tablet Take 40 mg by mouth in the morning. 01/30/17   [provider]  Cyanocobalamin (B-12 SL) Place 700 mcg under the tongue daily.    [provider]  cyanocobalamin (VITAMIN B12) 1000 MCG/ML injection Inject 1 mL (1,000 mcg total) into the muscle every 30 (thirty) days. 01/13/22   Crecencio Mc, MD  diazepam (VALIUM) 5 MG tablet TAKE ONE TABLET BY MOUTH EVERY 12 HOURS AS NEEDED FOR ANXIETY 03/24/22   Crecencio Mc, MD  gabapentin (NEURONTIN) 300 MG capsule Take 300 mg by mouth every evening. 11/12/17 11/12/21  [provider]  hydrochlorothiazide (HYDRODIURIL) 25 MG tablet Take 1 tablet (25 mg total) by mouth daily. 11/12/21   Crecencio Mc, MD  losartan (COZAAR) 100 MG tablet Take 100 mg by mouth in the morning. 07/30/20   [provider]  meloxicam (MOBIC) 15 MG tablet TAKE 1 TABLET(15 MG) BY MOUTH DAILY 05/08/19   Edrick Kins, DPM  Menthol, Topical Analgesic, (ICY HOT EX) Apply 1 application topically daily as needed (leg pain).    [provider]  metoprolol tartrate (LOPRESSOR) 25 MG tablet Take 1 tablet (25 mg total) by mouth 2 (two) times daily. 04/03/22   Burnard Hawthorne, FNP  tiZANidine (  ZANAFLEX) 2 MG tablet Take 1 tablet (2 mg total) by mouth every 6 (six) hours as needed for muscle spasms. 10/15/21   Crecencio Mc, MD  traZODone (DESYREL) 150 MG tablet Take 75 mg by mouth at bedtime.    [provider]     ALLERGIES:  Allergies  Allergen Reactions   Morphine Itching   Hydromorphone Itching     SOCIAL HISTORY:  Social History   Socioeconomic History   Marital status: Married    Spouse name: Gerald Stabs   Number of children: 2   Years of education: Not on file   Highest education level: Not on file  Occupational History   Not on file  Tobacco Use   Smoking status: Former     Packs/day: 1.00    Types: Cigarettes    Quit date: 01/2020    Years since quitting: 2.2   Smokeless tobacco: Never  Vaping Use   Vaping Use: Every day   Substances: Nicotine, Flavoring  Substance and Sexual Activity   Alcohol use: Yes    Alcohol/week: 14.0 standard drinks of alcohol    Types: 14 Glasses of wine per week    Comment: daily wine   Drug use: Never   Sexual activity: Yes  Other Topics Concern   Not on file  Social History Narrative   She is a social worked  at [employer] Strain: Not on file  Food Insecurity: Not on file  Transportation Needs: Not on file  Physical Activity: Not on file  Stress: Not on file  Social Connections: Not on file  Intimate Partner Violence: Not on file      FAMILY HISTORY:  Family History  Problem Relation Age of Onset   Hypertension Mother    Hyperlipidemia Mother    Depression Mother    Arthritis Mother    Mental illness Mother    Stroke Father    Early death Father    Depression Father    Arthritis Father    Mental illness Brother    Drug abuse Brother    Depression Brother    Cancer Brother        skin cancer   Arthritis Brother    Anxiety disorder Daughter    Anxiety disorder Son    Breast cancer Paternal Aunt 18   Hypertension Maternal Grandmother    Cancer Maternal Grandmother    Arthritis Maternal Grandmother    Diabetes Maternal Grandfather    Depression Maternal Grandfather    Cancer Paternal Grandfather      REVIEW OF SYSTEMS:  Constitutional: denies weight loss, fever, chills, or sweats  Eyes: denies any other vision changes, history of eye injury  ENT: denies sore throat, hearing problems  Respiratory: denies shortness of breath, wheezing  Cardiovascular: denies chest pain, palpitations  Gastrointestinal: Positive abdominal pain, nausea and vomiting Genitourinary: denies burning with urination or urinary frequency Musculoskeletal: denies any  other joint pains or cramps  Skin: denies any other rashes or skin discolorations  Neurological: denies any other headache, dizziness, weakness  Psychiatric: denies any other depression, anxiety   All other review of systems were negative   VITAL SIGNS:  Temp:  [97.7 F (36.5 C)] 97.7 F (36.5 C) (12/27 1420) Pulse Rate:  [67-74] 70 (12/27 1430) Resp:  [14-20] 14 (12/27 1430) BP: (94-125)/(58-76) 97/58 (12/27 1430) SpO2:  [95 %-100 %] 95 % (12/27 1430) Weight:  [79.8 kg] 79.8 kg (12/27 1139)  Height: 5\' 8"  (172.7 cm) Weight: 79.8 kg BMI (Calculated): 26.76   INTAKE/OUTPUT:  This shift: No intake/output data recorded.  Last 2 shifts: @IOLAST2SHIFTS @   PHYSICAL EXAM:  Constitutional:  -- Normal body habitus  -- Awake, alert, and oriented x3  Eyes:  -- Pupils equally round and reactive to light  -- No scleral icterus  Ear, nose, and throat:  -- No jugular venous distension  Pulmonary:  -- No crackles  -- Equal breath sounds bilaterally -- Breathing non-labored at rest Cardiovascular:  -- S1, S2 present  -- No pericardial rubs Gastrointestinal:  -- Abdomen soft, tender, distended, with guarding -- No abdominal masses appreciated, pulsatile or otherwise  Musculoskeletal and Integumentary:  -- Wounds: None appreciated -- Extremities: B/L UE and LE FROM, hands and feet warm, no edema  Neurologic:  -- Motor function: intact and symmetric -- Sensation: intact and symmetric   Labs:     Latest Ref Rng & Units 04/08/2022   12:03 PM 10/10/2021   11:24 AM 10/08/2021    3:23 PM  CBC  WBC 4.0 - 10.5 K/uL 7.0  6.2  5.9   Hemoglobin 12.0 - 15.0 g/dL 10/12/2021  10/10/2021  35.3   Hematocrit 36.0 - 46.0 % 34.1  36.9  32.8   Platelets 150 - 400 K/uL 344  291  262       Latest Ref Rng & Units 04/08/2022   12:03 PM 10/10/2021   11:24 AM 10/08/2021    3:23 PM  CMP  Glucose 70 - 99 mg/dL 10/12/2021  10/10/2021  96   BUN 6 - 20 mg/dL 17  13  17    Creatinine 0.44 - 1.00 mg/dL 540  086     Sodium 135 - 145 mmol/L 137  140  141   Potassium 3.5 - 5.1 mmol/L 3.6  3.6  3.5   Chloride 98 - 111 mmol/L 104  104  108   CO2 22 - 32 mmol/L 27  28  26    Calcium 8.9 - 10.3 mg/dL 9.0  9.3  9.0   Total Protein 6.5 - 8.1 g/dL 6.7     Total Bilirubin 0.3 - 1.2 mg/dL 0.9     Alkaline Phos 38 - 126 U/L 62     AST 15 - 41 U/L 20     ALT 0 - 44 U/L 15        Imaging studies:  EXAM: CT ABDOMEN AND PELVIS WITH CONTRAST   TECHNIQUE: Multidetector CT imaging of the abdomen and pelvis was performed using the standard protocol following bolus administration of intravenous contrast.   RADIATION DOSE REDUCTION: This exam was performed according to the departmental dose-optimization program which includes automated exposure control, adjustment of the mA and/or kV according to patient size and/or use of iterative reconstruction technique.   CONTRAST:  7.61 OMNIPAQUE IOHEXOL 300 MG/ML  SOLN   COMPARISON:  September 25, 2021.   FINDINGS: Lower chest: No acute abnormality.   Hepatobiliary: No focal liver abnormality is seen. Status post cholecystectomy. No biliary dilatation.   Pancreas: Unremarkable. No pancreatic ductal dilatation or surrounding inflammatory changes.   Spleen: Normal in size without focal abnormality.   Adrenals/Urinary Tract: Adrenal glands are unremarkable. Kidneys are normal, without renal calculi, focal lesion, or hydronephrosis. Bladder is unremarkable.   Stomach/Bowel: status post appendectomy. Postsurgical changes are seen involving sigmoid colon. There is no evidence of bowel obstruction. Free air is noted in the epigastric region concerning for rupture of hollow viscus. There appears  to be wall thickening of the distal stomach and proximal duodenum with possible ulceration in the duodenum which may be source of pneumoperitoneum.   Vascular/Lymphatic: Aortic atherosclerosis. No enlarged abdominal or pelvic lymph nodes.   Reproductive: Status post  hysterectomy. No adnexal masses.   Other: Mild amount of free fluid or ascites is noted in the pelvis and around the liver and spleen. No hernia is noted.   Musculoskeletal: No acute or significant osseous findings.   IMPRESSION: Pneumoperitoneum is noted, most likely due to perforated ulcer involving proximal duodenum. Mild amount of free fluid or ascites is noted in the pelvis and around the liver and spleen. Critical Value/emergent results were called by telephone at the time of interpretation on 04/08/2022 at 1:32 pm to provider [facility] , who verbally acknowledged these results.     Electronically Signed   By: Marijo Conception M.D.   On: 04/08/2022 13:33  Assessment/Plan:  61 y.o. female with perforation of intestine most likely from duodenal ulcer, complicated by pertinent comorbidities including fibromyalgia.  Patient mention about possible peptic ulcer disease with perforation.  Patient takes ibuprofen and meloxicam daily due to fibromyalgia.  Patient oriented about the recommendation of emergent surgical intervention for repair of the perforation.  Patient was oriented about trying minimal invasive approach with possibility of converting to open surgery.  Patient also oriented about risks of surgery to include bleeding, infection, perforation of intestine, intra-abdominal infection, injury to adjacent organs such as liver, pancreatic, bladder among others.  The patient reports she understood and agreed to proceed.  Arnold Long, MD

## 2022-04-08 NOTE — ED Triage Notes (Signed)
First Nurse note:  Arrives from home via [facility].  C/O lower abdominal pain x 1 hour.  VS wnl.

## 2022-04-08 NOTE — ED Provider Triage Note (Signed)
Emergency Medicine Provider Triage Evaluation Note  Allison Alvarez , a 61 y.o. female  was evaluated in triage.  Pt complains of abdominal pain with nausea x 1-2 hours.  Hx of diverticulitis, uti's and abdominal surgery.  No known fever, denies chills.   Review of Systems  Positive: Nausea, last BM 2-3 days ago Negative:   Physical Exam  BP 125/76 (BP Location: Left Arm)   Pulse 67   Resp 20   Ht 5\' 8"  (1.727 m)   Wt 79.8 kg   SpO2 100%   BMI 26.75 kg/m  Gen:   Awake, no distress   frequently hollering  Resp:  Normal effort Lungs clear.  MSK:   Moves extremities without difficulty  Other:  Abdomen soft, tender generalized through out.   Medical Decision Making  Medically screening exam initiated at 12:08 PM.  Appropriate orders placed.  Allison Alvarez was informed that the remainder of the evaluation will be completed by another provider, this initial triage assessment does not replace that evaluation, and the importance of remaining in the ED until their evaluation is complete.     Allison, Merkle, PA-C 04/08/22 1212

## 2022-04-08 NOTE — Anesthesia Preprocedure Evaluation (Signed)
Anesthesia Evaluation  Patient identified by MRN, date of birth, ID band Patient awake    Reviewed: Allergy & Precautions, NPO status , Patient's Chart, lab work & pertinent test results  History of Anesthesia Complications (+) PONV and history of anesthetic complications  Airway Mallampati: II  TM Distance: >3 FB Neck ROM: Full    Dental no notable dental hx. (+) Teeth Intact   Pulmonary neg pulmonary ROS, neg sleep apnea, neg COPD, Patient abstained from smoking.Not current smoker, former smoker   Pulmonary exam normal breath sounds clear to auscultation       Cardiovascular Exercise Tolerance: Good METShypertension, (-) CAD and (-) Past MI (-) dysrhythmias  Rhythm:Regular Rate:Normal - Systolic murmurs    Neuro/Psych  PSYCHIATRIC DISORDERS Anxiety Depression     Neuromuscular disease    GI/Hepatic ,neg GERD  ,,(+)     (-) substance abuse    Endo/Other  neg diabetes    Renal/GU negative Renal ROS     Musculoskeletal  (+)  Fibromyalgia -  Abdominal  (+)  Abdomen: tender.   Peds  Hematology   Anesthesia Other Findings Past Medical History: No date: Allergy No date: Anxiety No date: Arthritis No date: Depression No date: Diverticulitis No date: Fibromyalgia No date: Hyperlipidemia No date: Hypertension No date: PONV (postoperative nausea and vomiting) No date: PVC (premature ventricular contraction)  Reproductive/Obstetrics                              Anesthesia Physical Anesthesia Plan  ASA: 2 and emergent  Anesthesia Plan: General   Post-op Pain Management: Ofirmev IV (intra-op)* and Ketamine IV*   Induction: Intravenous and Rapid sequence  PONV Risk Score and Plan: 4 or greater and Ondansetron, Dexamethasone, Propofol infusion, TIVA and Midazolam  Airway Management Planned: Oral ETT  Additional Equipment: None  Intra-op Plan:   Post-operative Plan: Extubation  in OR  Informed Consent: I have reviewed the patients History and Physical, chart, labs and discussed the procedure including the risks, benefits and alternatives for the proposed anesthesia with the patient or authorized representative who has indicated his/her understanding and acceptance.     Dental advisory given  Plan Discussed with: CRNA and Surgeon  Anesthesia Plan Comments: (Discussed risks of anesthesia with patient, including PONV, sore throat, lip/dental/eye damage. Rare risks discussed as well, such as cardiorespiratory and neurological sequelae, and allergic reactions. Discussed the role of CRNA in patient's perioperative care. Patient understands.)         Anesthesia Quick Evaluation

## 2022-04-08 NOTE — ED Provider Notes (Signed)
[facility] Provider Note    None    (approximate)   History    EM caveat, life-threatening severe abdominal pain  HPI  Allison Alvarez is a 61 y.o. female previous history of abdominal surgery, diverticulitis,  About 1 hour before she got to the ER she suddenly out of the blue started having severe pain in her mid upper abdomen.  Nausea but no vomiting.  Pain is severe, relieved somewhat by fentanyl but still severe somewhat.  She reports she is never had pain this severe before  No chest pain no trouble breathing.  Reports severe pain in the upper abdomen.       Physical Exam   Triage Vital Signs: ED Triage Vitals  Enc Vitals Group     BP 04/08/22 1156 125/76     Pulse Rate 04/08/22 1156 67     Resp 04/08/22 1156 20     Temp --      Temp Source 04/08/22 1156 Oral     SpO2 04/08/22 1156 100 %     Weight 04/08/22 1139 175 lb 14.8 oz (79.8 kg)     Height 04/08/22 1139 5\' 8"  (1.727 m)     Head Circumference --      Peak Flow --      Pain Score --      Pain Loc --      Pain Edu? --      Excl. in GC? --     Most recent vital signs: Vitals:   04/08/22 1156  BP: 125/76  Pulse: 67  Resp: 20  SpO2: 100%     General: Awake, fully alert but in painful distress.  Hemodynamics are normal CV:  Good peripheral perfusion.  Normal tones Resp:  Normal effort.  Abd:  No distention.  Patient requests not to have her abdomen examined is too extremely tender to be touched. Other:  Warm well-perfused extremities   ED Results / Procedures / Treatments   Labs (all labs ordered are listed, but only abnormal results are displayed) Labs Reviewed  LIPASE, BLOOD - Abnormal; Notable for the following components:      Result Value   Lipase 60 (*)    All other components within normal limits  COMPREHENSIVE METABOLIC PANEL - Abnormal; Notable for the following components:   Glucose, Bld 152 (*)    Creatinine, Ser 1.06 (*)    All other components within  normal limits  CBC - Abnormal; Notable for the following components:   RBC 3.51 (*)    Hemoglobin 11.5 (*)    HCT 34.1 (*)    All other components within normal limits  URINALYSIS, ROUTINE W REFLEX MICROSCOPIC  LACTIC ACID, PLASMA  TYPE AND SCREEN    States EKG interpreted at 1400 heart rate 65 QRS 120 QTc 480 Mild nonspecific T wave abnormality noted in anteroseptal distribution.  No evidence of obvious acute ischemia or ACS noted   RADIOLOGY  CT abdomen pelvis interpreted by me, grossly there appears to be a small pocket of potential free air in the epigastric region.  Defer to radiologist though for more detailed read and additional detail  CT ABDOMEN PELVIS W CONTRAST  Result Date: 04/08/2022 CLINICAL DATA:  Acute generalized abdominal pain. EXAM: CT ABDOMEN AND PELVIS WITH CONTRAST TECHNIQUE: Multidetector CT imaging of the abdomen and pelvis was performed using the standard protocol following bolus administration of intravenous contrast. RADIATION DOSE REDUCTION: This exam was performed according to the departmental dose-optimization program  which includes automated exposure control, adjustment of the mA and/or kV according to patient size and/or use of iterative reconstruction technique. CONTRAST:  OMNIPAQUE IOHEXOL 300 MG/ML  SOLN COMPARISON:  September 25, 2021. FINDINGS: Lower chest: No acute abnormality. Hepatobiliary: No focal liver abnormality is seen. Status post cholecystectomy. No biliary dilatation. Pancreas: Unremarkable. No pancreatic ductal dilatation or surrounding inflammatory changes. Spleen: Normal in size without focal abnormality. Adrenals/Urinary Tract: Adrenal glands are unremarkable. Kidneys are normal, without renal calculi, focal lesion, or hydronephrosis. Bladder is unremarkable. Stomach/Bowel: status post appendectomy. Postsurgical changes are seen involving sigmoid colon. There is no evidence of bowel obstruction. Free air is noted in the epigastric region  concerning for rupture of hollow viscus. There appears to be wall thickening of the distal stomach and proximal duodenum with possible ulceration in the duodenum which may be source of pneumoperitoneum. Vascular/Lymphatic: Aortic atherosclerosis. No enlarged abdominal or pelvic lymph nodes. Reproductive: Status post hysterectomy. No adnexal masses. Other: Mild amount of free fluid or ascites is noted in the pelvis and around the liver and spleen. No hernia is noted. Musculoskeletal: No acute or significant osseous findings. IMPRESSION: Pneumoperitoneum is noted, most likely due to perforated ulcer involving proximal duodenum. Mild amount of free fluid or ascites is noted in the pelvis and around the liver and spleen. Critical Value/emergent results were called by telephone at the time of interpretation on 04/08/2022 at 1:32 pm to provider [facility] , who verbally acknowledged these results. Electronically Signed   By: Lupita Raider M.D.   On: 04/08/2022 13:33      PROCEDURES:  Critical Care performed: Yes, see critical care procedure note(s)  Procedures  CRITICAL CARE Performed by: Sharyn Creamer   Total critical care time: 30 minutes  Critical care time was exclusive of separately billable procedures and treating other patients.  Critical care was necessary to treat or prevent imminent or life-threatening deterioration.  Critical care was time spent personally by me on the following activities: development of treatment plan with patient and/or surrogate as well as nursing, discussions with consultants, evaluation of patient's response to treatment, examination of patient, obtaining history from patient or surrogate, ordering and performing treatments and interventions, ordering and review of laboratory studies, ordering and review of radiographic studies, pulse oximetry and re-evaluation of patient's condition.   MEDICATIONS ORDERED IN ED: Medications  piperacillin-tazobactam (ZOSYN)  IVPB 3.375 g (has no administration in time range)  ondansetron (ZOFRAN) injection 4 mg (4 mg Intravenous Given 04/08/22 1209)  oxyCODONE-acetaminophen (PERCOCET/ROXICET) 5-325 MG per tablet 1 tablet (1 tablet Oral Given 04/08/22 1210)  fentaNYL (SUBLIMAZE) injection 50 mcg (50 mcg Intravenous Given by Other 04/08/22 1315)  iohexol (OMNIPAQUE) 300 MG/ML solution 100 mL (100 mLs Intravenous Contrast Given 04/08/22 1308)  pantoprazole (PROTONIX) injection 40 mg (40 mg Intravenous Given 04/08/22 1353)  fentaNYL (SUBLIMAZE) injection 75 mcg (75 mcg Intravenous Given 04/08/22 1350)  sodium chloride 0.9 % bolus 500 mL (500 mLs Intravenous New Bag/Given 04/08/22 1356)     IMPRESSION / MDM / ASSESSMENT AND PLAN / ED COURSE  I reviewed the triage vital signs and the nursing notes.                              Differential diagnosis includes, but is not limited to, perforated viscus, diverticulum, peptic ulcer disease, acute bowel obstruction, gastritis, cholecystitis etc.  Based on the patient's workup and her CT scan it appears the patient has  acute abdomen and a high concern for pneumoperitoneum.  Stat surgery consult has been placed.  Pain control IV fluids, and will initiate PPI.  N.p.o.  No acute cardiopulmonary symptoms.  No vascular abnormalities.  Patient's presentation is most consistent with acute presentation with potential threat to life or bodily function.  ----------------------------------------- 1:57 PM on 04/08/2022 ----------------------------------------- Case discussed with the OR nurse who is with Dr. Maia Plan.  Stat consult has been placed with general surgery    The patient is on the cardiac monitor to evaluate for evidence of arrhythmia and/or significant heart rate changes.    Patient seen and evaluated by Dr. Maia Plan in the ED. admitted to surgery service  FINAL CLINICAL IMPRESSION(S) / ED DIAGNOSES   Final diagnoses:  Acute abdomen     Rx / DC Orders   ED  Discharge Orders     None        Note:  This document was prepared using Dragon voice recognition software and may include unintentional dictation errors.   Sharyn Creamer, MD 04/08/22 [phone]

## 2022-04-08 NOTE — Op Note (Signed)
Preoperative diagnosis: Perforated duodenal ulcer   Postoperative diagnosis: Same  Procedure: Robotic Assisted Laparoscopic Duodenal ulcer repair and omental flap placement.   Anesthesia: GETA   Surgeon: Dr. Hazle Quant  Wound Classification: Contaminated  Indications: Patient is a 61 y.o. female developed severe generalized abdominal pain.  Physical exam with acute abdomen.  CT scan shows abundant amount of free air after fluid.  Findings: -Abundant amount of purulent and bilious peritonitis throughout the abdomen upon entering the abdominal cavity.   Description of procedure: The patient was placed on the operating table in the supine position. General anesthesia was induced. A time-out was completed verifying correct patient, procedure, site, positioning, and implant(s) and/or special equipment prior to beginning this procedure. An orogastric tube was placed. The abdomen was prepped and draped in the usual sterile fashion.  An incision was made in a natural skin line below the umbilicus.  The fascia was elevated and the Veress needle inserted. Proper position was confirmed by aspiration and saline meniscus test.  The abdomen was insufflated with carbon dioxide to a pressure of 15 mmHg. The patient tolerated insufflation well. A 8-mm trocar was then inserted in optiview fashion.  The laparoscope was inserted and the abdomen inspected.  Upon entering the abdominal cavity and the amount of purulent bilious peritonitis was identified.  No injuries from initial trocar placement were noted. Additional trocars were then inserted in the following locations: an 8-mm trocar in the left lateral abdomen, and another two 8-mm trocars to the right side of the abdomen 5 cm appart. The abdomen was inspected and no abnormalities were found. The table was placed in the reverse Trendelenburg position with the right side up. The robotic arms were docked and target anatomy identified. Instrument inserted under  direct visualization.  Filmy adhesions between the gallbladder and omentum, duodenum and transverse colon were lysed with electrocautery.  With blunt dissection the omentum was separated down from the stomach.  An 8 mm anterior duodenal ulcer of the first portion of the duodenum was identified.  The small duodenal ulcer was repaired with 2-0 Vicryl.  To reinforce the repair, four interrupted sutures of 2-0 silk were placed through healthy duodenal tissue in such a fashion as to span the perforation. These were left untied for now. A mobile portion of viable omentum was brought up, placed over the perforation, and secured in place by tying the previously placed sutures over it in such a manner as to completely close the hole in the duodenum. Care was taken to avoid excess tension.  Hemostasis was checked. The abdomen was copiously lavaged with warm saline.  A drain was left in the right upper quadrant.  Skin incisions were closed with staples.  Drain fixed with Ethilon.  The patient tolerated the procedure well and was taken to the postanesthesia care unit in stable condition.   Specimen: None  Complications: None  EBL: 25 mL

## 2022-04-08 NOTE — Consult Note (Signed)
PHARMACY -  BRIEF ANTIBIOTIC NOTE   Pharmacy has received consult(s) for Zosyn dosing for intraabdominal infection from an ED provider.  The patient's profile has been reviewed for ht/wt/allergies/indication/available labs.    One time order(s) placed for Zosyn 3.375 grams IV x 1  Further antibiotics/pharmacy consults should be ordered by admitting physician if indicated.                       Thank you, Barrie Folk 04/08/2022  1:55 PM

## 2022-04-09 ENCOUNTER — Encounter: Payer: Self-pay | Admitting: General Surgery

## 2022-04-09 ENCOUNTER — Inpatient Hospital Stay: Payer: BC Managed Care – PPO

## 2022-04-09 LAB — CBC
HCT: 30.9 % — ABNORMAL LOW (ref 36.0–46.0)
Hemoglobin: 10.3 g/dL — ABNORMAL LOW (ref 12.0–15.0)
MCH: 32.3 pg (ref 26.0–34.0)
MCHC: 33.3 g/dL (ref 30.0–36.0)
MCV: 96.9 fL (ref 80.0–100.0)
Platelets: 266 10*3/uL (ref 150–400)
RBC: 3.19 MIL/uL — ABNORMAL LOW (ref 3.87–5.11)
RDW: 12.1 % (ref 11.5–15.5)
WBC: 9.7 10*3/uL (ref 4.0–10.5)
nRBC: 0 % (ref 0.0–0.2)

## 2022-04-09 LAB — BASIC METABOLIC PANEL
Anion gap: 7 (ref 5–15)
BUN: 19 mg/dL (ref 6–20)
CO2: 25 mmol/L (ref 22–32)
Calcium: 8.3 mg/dL — ABNORMAL LOW (ref 8.9–10.3)
Chloride: 107 mmol/L (ref 98–111)
Creatinine, Ser: 1.1 mg/dL — ABNORMAL HIGH (ref 0.44–1.00)
GFR, Estimated: 58 mL/min — ABNORMAL LOW (ref >60)
Glucose, Bld: 145 mg/dL — ABNORMAL HIGH (ref 70–99)
Potassium: 3.2 mmol/L — ABNORMAL LOW (ref 3.5–5.1)
Sodium: 139 mmol/L (ref 135–145)

## 2022-04-09 MED ORDER — IOHEXOL 300 MG/ML  SOLN
150.0000 mL | Freq: Once | INTRAMUSCULAR | Status: AC | PRN
  Administered 2022-04-09: 60 mL via ORAL
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
## 2022-04-09 NOTE — Plan of Care (Signed)
  Problem: Education: Goal: Knowledge of General Education information will improve Description Including pain rating scale, medication(s)/side effects and non-pharmacologic comfort measures Outcome: Progressing   Problem: Clinical Measurements: Goal: Ability to maintain clinical measurements within normal limits will improve Outcome: Progressing   Problem: Activity: Goal: Risk for activity intolerance will decrease Outcome: Progressing   

## 2022-04-09 NOTE — Plan of Care (Signed)

## 2022-04-09 NOTE — Progress Notes (Signed)
Patient ID: Allison Alvarez, female   DOB: 09-03-1960, 61 y.o.   MRN: [id number]     SURGICAL PROGRESS NOTE   Hospital Day(s): 1.   Interval History: Patient seen and examined, no acute events or new complaints overnight. Patient reports she feels better than before surgery.  Patient endorses having pain that is controlled with current pain medication.  She denies any nausea or vomiting.  She endorses passing gas.   Vital signs in last 24 hours: [min-max] current  Temp:  [97.7 F (36.5 C)-99.6 F (37.6 C)] 99.1 F (37.3 C) (12/28 1540) Pulse Rate:  [67-82] 82 (12/28 1540) Resp:  [13-25] 17 (12/28 1540) BP: (83-126)/(58-93) 87/59 (12/28 1540) SpO2:  [85 %-96 %] 94 % (12/28 1540)     Height: 5\' 8"  (172.7 cm) Weight: 77.1 kg BMI (Calculated): 25.85   Physical Exam:  Constitutional: alert, cooperative and no distress  Respiratory: breathing non-labored at rest  Cardiovascular: regular rate and sinus rhythm  Gastrointestinal: soft, non-tender, and non-distended  Labs:     Latest Ref Rng & Units 04/09/2022    4:55 AM 04/08/2022   12:03 PM 10/10/2021   11:24 AM  CBC  WBC 4.0 - 10.5 K/uL 9.7  7.0  6.2   Hemoglobin 12.0 - 15.0 g/dL 10/12/2021  85.6  31.4   Hematocrit 36.0 - 46.0 % 30.9  34.1  36.9   Platelets 150 - 400 K/uL 266  344  291       Latest Ref Rng & Units 04/09/2022    4:55 AM 04/08/2022   12:03 PM 10/10/2021   11:24 AM  CMP  Glucose 70 - 99 mg/dL 10/12/2021  263  785   BUN 6 - 20 mg/dL 19  17  13    Creatinine 0.44 - 1.00 mg/dL 885   0.27   Sodium 135 - 145 mmol/L 139  137  140   Potassium 3.5 - 5.1 mmol/L 3.2  3.6  3.6   Chloride 98 - 111 mmol/L 107  104  104   CO2 22 - 32 mmol/L 25  27  28    Calcium 8.9 - 10.3 mg/dL 8.3  9.0  9.3   Total Protein 6.5 - 8.1 g/dL  6.7    Total Bilirubin 0.3 - 1.2 mg/dL  0.9    Alkaline Phos 38 - 126 U/L  62    AST 15 - 41 U/L  20    ALT 0 - 44 U/L  15      Imaging studies: No new pertinent imaging studies   Assessment/Plan:  61 y.o.  female with duodenal ulcer perforation 1 Day Post-Op s/p robotic assisted laparoscopic duodenal ulcer perforation repair.  -Doing well today. -Vital signs shows no fever, tachycardia. -Pain control. -Upper GI series shows no sign of leak from repair. -Will start liquid diet. -Continue high-dose PPI -Continue antibiotic therapy due to severe purulent peritonitis -Monitor drain output -Encourage patient to ambulate  2.87, MD

## 2022-04-09 NOTE — Anesthesia Postprocedure Evaluation (Signed)
Anesthesia Post Note  Patient: Allison Alvarez  Procedure(s) Performed: XI ROBOT ASSISTED DUODENAL ULCER PERFORATION REPAIR (Abdomen)  Patient location during evaluation: PACU Anesthesia Type: General Level of consciousness: awake and alert Pain management: pain level controlled Vital Signs Assessment: post-procedure vital signs reviewed and stable Respiratory status: spontaneous breathing, nonlabored ventilation, respiratory function stable and patient connected to nasal cannula oxygen Cardiovascular status: blood pressure returned to baseline and stable Postop Assessment: no apparent nausea or vomiting Anesthetic complications: no   No notable events documented.   Last Vitals:  Vitals:   04/09/22 0149 04/09/22 0504  BP: 102/66 101/63  Pulse: 78 78  Resp: 20 20  Temp: 37.2 C 37.3 C  SpO2: 92% 93%    Last Pain:  Vitals:   04/09/22 0149  TempSrc:   PainSc: 0-No pain                 Corinda Gubler

## 2022-04-09 NOTE — Progress Notes (Addendum)
9147 Verbal order with readback for pt to have a few ice chips  1541 NG tube removed from L nare at this time. Cath tip intact. Pt tolerated   1550 MD Hazle Quant aware of soft bp. No new orders. Pt will ambulate in room  1828 Pt ambulating in room and voiding post foley removal. Tolerated clear liquid diet without complaints of n/v

## 2022-04-09 NOTE — Progress Notes (Signed)
  Transition of Care [facility]) Screening Note   Patient Details  Name: Allison Alvarez Date of Birth: [birth year]   Transition of Care [facility]) CM/SW Contact:    Colin Broach, LCSW Phone Number: 04/09/2022, 11:59 AM    Transition of Care Department [facility]) has reviewed patient and no TOC needs have been identified at this time. We will continue to monitor patient advancement through interdisciplinary progression rounds. If new patient transition needs arise, please place a TOC consult.

## 2022-04-10 LAB — CBC
HCT: 27.5 % — ABNORMAL LOW (ref 36.0–46.0)
Hemoglobin: 9 g/dL — ABNORMAL LOW (ref 12.0–15.0)
MCH: 31.9 pg (ref 26.0–34.0)
MCHC: 32.7 g/dL (ref 30.0–36.0)
MCV: 97.5 fL (ref 80.0–100.0)
Platelets: 233 10*3/uL (ref 150–400)
RBC: 2.82 MIL/uL — ABNORMAL LOW (ref 3.87–5.11)
RDW: 12.6 % (ref 11.5–15.5)
WBC: 11.4 10*3/uL — ABNORMAL HIGH (ref 4.0–10.5)
nRBC: 0 % (ref 0.0–0.2)

## 2022-04-10 LAB — BASIC METABOLIC PANEL
Anion gap: 6 (ref 5–15)
BUN: 19 mg/dL (ref 6–20)
CO2: 24 mmol/L (ref 22–32)
Calcium: 8 mg/dL — ABNORMAL LOW (ref 8.9–10.3)
Chloride: 106 mmol/L (ref 98–111)
Creatinine, Ser: 0.99 mg/dL (ref 0.44–1.00)
GFR, Estimated: >60 mL/min (ref >60)
Glucose, Bld: 100 mg/dL — ABNORMAL HIGH (ref 70–99)
Potassium: 3.1 mmol/L — ABNORMAL LOW (ref 3.5–5.1)
Sodium: 136 mmol/L (ref 135–145)

## 2022-04-10 MED ORDER — FLUCONAZOLE 100 MG PO TABS
100.0000 mg | ORAL_TABLET | Freq: Once | ORAL | Status: AC
  Administered 2022-04-10: 100 mg via ORAL
  Filled 2022-04-10: qty 1

## 2022-04-10 MED ORDER — HYDROCODONE-ACETAMINOPHEN 5-325 MG PO TABS: 1.0000 | ORAL_TABLET | ORAL | 0 refills | Status: AC | PRN

## 2022-04-10 NOTE — Plan of Care (Signed)

## 2022-04-10 NOTE — Discharge Instructions (Signed)
  Diet: Resume home heart healthy regular diet.   Activity: No heavy lifting >20 pounds (children, pets, laundry, garbage) or strenuous activity until follow-up, but light activity and walking are encouraged. Do not drive or drink alcohol if taking narcotic pain medications.  Wound care: May shower with soapy water and pat dry (do not rub incisions), but no baths or submerging incision underwater until follow-up. (no swimming)   Empty and recharge drain two times per day.   Medications: Resume all home medications. For mild to moderate pain: acetaminophen (Tylenol). Combining Tylenol with alcohol can substantially increase your risk of causing liver disease. Narcotic pain medications, if prescribed, can be used for severe pain, though may cause nausea, constipation, and drowsiness. Do not combine Tylenol and Norco within a 6 hour period as Norco contains Tylenol. If you do not need the narcotic pain medication, you do not need to fill the prescription.  Call office [phone]) at any time if any questions, worsening pain, fevers/chills, bleeding, drainage from incision site, or other concerns.

## 2022-04-11 ENCOUNTER — Inpatient Hospital Stay: Payer: BC Managed Care – PPO

## 2022-04-11 MED ORDER — TRAZODONE HCL 50 MG PO TABS
150.0000 mg | ORAL_TABLET | Freq: Every evening | ORAL | Status: DC | PRN
  Administered 2022-04-11: 150 mg via ORAL
  Filled 2022-04-11: qty 1

## 2022-04-11 MED ORDER — CITALOPRAM HYDROBROMIDE 10 MG PO TABS
40.0000 mg | ORAL_TABLET | Freq: Every day | ORAL | Status: DC
  Administered 2022-04-11 – 2022-04-12 (×2): 40 mg via ORAL
  Filled 2022-04-11 (×2): qty 4

## 2022-04-11 MED ORDER — HYDROCHLOROTHIAZIDE 25 MG PO TABS
25.0000 mg | ORAL_TABLET | Freq: Every day | ORAL | Status: DC
  Administered 2022-04-11 – 2022-04-12 (×2): 25 mg via ORAL
  Filled 2022-04-11 (×2): qty 1

## 2022-04-11 MED ORDER — IOHEXOL 300 MG/ML  SOLN
100.0000 mL | Freq: Once | INTRAMUSCULAR | Status: AC | PRN
  Administered 2022-04-11: 100 mL via INTRAVENOUS

## 2022-04-11 MED ORDER — METOPROLOL TARTRATE 25 MG PO TABS
25.0000 mg | ORAL_TABLET | Freq: Two times a day (BID) | ORAL | Status: DC
  Administered 2022-04-11 – 2022-04-12 (×3): 25 mg via ORAL
  Filled 2022-04-11 (×4): qty 1

## 2022-04-11 MED ORDER — IOHEXOL 9 MG/ML PO SOLN
500.0000 mL | ORAL | Status: AC
  Administered 2022-04-11 (×2): 500 mL via ORAL

## 2022-04-11 MED ORDER — LOSARTAN POTASSIUM 50 MG PO TABS
100.0000 mg | ORAL_TABLET | Freq: Every day | ORAL | Status: DC
  Administered 2022-04-11 – 2022-04-12 (×2): 100 mg via ORAL
  Filled 2022-04-11 (×2): qty 2

## 2022-04-11 MED ORDER — DIAZEPAM 5 MG PO TABS: 5.0000 mg | ORAL_TABLET | Freq: Two times a day (BID) | ORAL | Status: DC | PRN

## 2022-04-11 MED ORDER — BUSPIRONE HCL 10 MG PO TABS
10.0000 mg | ORAL_TABLET | Freq: Two times a day (BID) | ORAL | Status: DC
  Administered 2022-04-11 – 2022-04-12 (×3): 10 mg via ORAL
  Filled 2022-04-11 (×3): qty 1

## 2022-04-11 NOTE — Plan of Care (Signed)

## 2022-04-11 NOTE — Progress Notes (Signed)
Subjective:  CC: Allison Alvarez is a 61 y.o. female  Hospital stay day 3, 3 Days Post-Op s/p robotic assisted laparoscopic duodenal ulcer perforation repair.   HPI: Increasing pain in the epigastric area reported overnight.  Tried some regular food.  Unsure if the pain worsened after the food.  Denies any nausea or vomiting.  ROS:  General: Denies weight loss, weight gain, fatigue, fevers, chills, and night sweats. Heart: Denies chest pain, palpitations, racing heart, irregular heartbeat, leg pain or swelling, and decreased activity tolerance. Respiratory: Denies breathing difficulty, shortness of breath, wheezing, cough, and sputum. GI: Denies change in appetite, heartburn, nausea, vomiting, constipation, diarrhea, and blood in stool. GU: Denies difficulty urinating, pain with urinating, urgency, frequency, blood in urine.   Objective:   Temp:  [98.2 F (36.8 C)-99.7 F (37.6 C)] 98.2 F (36.8 C) (12/30 0801) Pulse Rate:  [75-89] 89 (12/30 0801) Resp:  [17-18] 17 (12/30 0801) BP: (106-128)/(66-81) 128/81 (12/30 0801) SpO2:  [93 %-94 %] 94 % (12/30 0801)     Height: 5\' 8"  (172.7 cm) Weight: 77.1 kg BMI (Calculated): 25.85   Intake/Output this shift:   Intake/Output Summary (Last 24 hours) at 04/11/2022 0840 Last data filed at 04/11/2022 0615 Gross per 24 hour  Intake 1039.77 ml  Output 95 ml  Net 944.77 ml    Constitutional :  alert, cooperative, appears stated age, and no distress  Respiratory:  clear to auscultation bilaterally  Cardiovascular:  regular rate and rhythm  Gastrointestinal: Soft, no guarding, focal tenderness to palpation in epigastric area.  JP remains serous scant fluid .   Skin: Cool and moist.  Port site incisions clean dry and intact  Psychiatric: Normal affect, non-agitated, not confused       LABS:     Latest Ref Rng & Units 04/10/2022    6:29 AM 04/09/2022    4:55 AM 04/08/2022   12:03 PM  CMP  Glucose 70 - 99 mg/dL 04/10/2022  638  756   BUN 6 - 20  mg/dL 19  19  17    Creatinine 0.44 - 1.00 mg/dL 433   2.95   Sodium 135 - 145 mmol/L 136  139  137   Potassium 3.5 - 5.1 mmol/L 3.1  3.2  3.6   Chloride 98 - 111 mmol/L 106  107  104   CO2 22 - 32 mmol/L 24  25  27    Calcium 8.9 - 10.3 mg/dL 8.0  8.3  9.0   Total Protein 6.5 - 8.1 g/dL   6.7   Total Bilirubin 0.3 - 1.2 mg/dL   0.9   Alkaline Phos 38 - 126 U/L   62   AST 15 - 41 U/L   20   ALT 0 - 44 U/L   15       Latest Ref Rng & Units 04/10/2022    6:29 AM 04/09/2022    4:55 AM 04/08/2022   12:03 PM  CBC  WBC 4.0 - 10.5 K/uL 11.4  9.7  7.0   Hemoglobin 12.0 - 15.0 g/dL 9.0  04/12/2022  04/11/2022   Hematocrit 36.0 - 46.0 % 27.5  30.9  34.1   Platelets 150 - 400 K/uL 233  266  344     RADS: Pending CT Assessment:   s/p robotic assisted laparoscopic duodenal ulcer perforation repair.   Increasing epigastric pain and leukocytosis after resuming diet.  Upper GI series normal with no evidence of leak and JP remains serous output, but will proceed  with a CT scan to make sure no other pathology.  labs/images/medications/previous chart entries reviewed personally and relevant changes/updates noted above.

## 2022-04-11 NOTE — Plan of Care (Signed)
No new changes in assessment. Patient requesting to resume her home medications if possible.   Problem: Education: Goal: Knowledge of General Education information will improve Description: Including pain rating scale, medication(s)/side effects and non-pharmacologic comfort measures Outcome: Progressing   Problem: Health Behavior/Discharge Planning: Goal: Ability to manage health-related needs will improve Outcome: Progressing   Problem: Clinical Measurements: Goal: Ability to maintain clinical measurements within normal limits will improve Outcome: Progressing Goal: Will remain free from infection Outcome: Progressing Goal: Diagnostic test results will improve Outcome: Progressing Goal: Respiratory complications will improve Outcome: Progressing Goal: Cardiovascular complication will be avoided Outcome: Progressing   Problem: Activity: Goal: Risk for activity intolerance will decrease Outcome: Progressing   Problem: Nutrition: Goal: Adequate nutrition will be maintained Outcome: Progressing   Problem: Coping: Goal: Level of anxiety will decrease Outcome: Progressing   Problem: Elimination: Goal: Will not experience complications related to bowel motility Outcome: Progressing Goal: Will not experience complications related to urinary retention Outcome: Progressing   Problem: Pain Managment: Goal: General experience of comfort will improve Outcome: Progressing   Problem: Safety: Goal: Ability to remain free from injury will improve Outcome: Progressing   Problem: Skin Integrity: Goal: Risk for impaired skin integrity will decrease Outcome: Progressing

## 2022-04-11 NOTE — Progress Notes (Signed)
04/11/22  CT scan personally viewed and discussed with Dr. Elby Showers with radiology.  No evidence of contrast extravasation at perforation repair site.  Expected post-surgical changes in the area.  There is a large fluid collection in the pelvis which is rim-enhancing, suggestive of abscess.  Will make patient NPO after midnight and have discussed with Dr. Elby Showers about IR percutaneous drainage catheter placement, which will be tomorrow.  Discussed CT results and plan with the patient and her family.  All questions answered.  Henrene Dodge, MD

## 2022-04-12 ENCOUNTER — Inpatient Hospital Stay: Payer: BC Managed Care – PPO

## 2022-04-12 LAB — CBC
HCT: 25.9 % — ABNORMAL LOW (ref 36.0–46.0)
Hemoglobin: 8.9 g/dL — ABNORMAL LOW (ref 12.0–15.0)
MCH: 32.7 pg (ref 26.0–34.0)
MCHC: 34.4 g/dL (ref 30.0–36.0)
MCV: 95.2 fL (ref 80.0–100.0)
Platelets: 265 10*3/uL (ref 150–400)
RBC: 2.72 MIL/uL — ABNORMAL LOW (ref 3.87–5.11)
RDW: 12 % (ref 11.5–15.5)
WBC: 8.6 10*3/uL (ref 4.0–10.5)
nRBC: 0 % (ref 0.0–0.2)

## 2022-04-12 LAB — BASIC METABOLIC PANEL
Anion gap: 5 (ref 5–15)
BUN: 7 mg/dL (ref 6–20)
CO2: 29 mmol/L (ref 22–32)
Calcium: 7.9 mg/dL — ABNORMAL LOW (ref 8.9–10.3)
Chloride: 103 mmol/L (ref 98–111)
Creatinine, Ser: 0.95 mg/dL (ref 0.44–1.00)
GFR, Estimated: >60 mL/min (ref >60)
Glucose, Bld: 136 mg/dL — ABNORMAL HIGH (ref 70–99)
Potassium: 2.8 mmol/L — ABNORMAL LOW (ref 3.5–5.1)
Sodium: 137 mmol/L (ref 135–145)

## 2022-04-12 LAB — PROTIME-INR
INR: 1.1 (ref 0.8–1.2)
Prothrombin Time: 14.1 seconds (ref 11.4–15.2)

## 2022-04-12 LAB — POTASSIUM: Potassium: 3.6 mmol/L (ref 3.5–5.1)

## 2022-04-12 MED ORDER — MIDAZOLAM HCL 2 MG/2ML IJ SOLN
INTRAMUSCULAR | Status: AC
  Filled 2022-04-12: qty 4

## 2022-04-12 MED ORDER — SODIUM CHLORIDE 0.9% FLUSH
5.0000 mL | Freq: Three times a day (TID) | INTRAVENOUS | Status: DC
  Administered 2022-04-12: 5 mL

## 2022-04-12 MED ORDER — FENTANYL CITRATE (PF) 100 MCG/2ML IJ SOLN
INTRAMUSCULAR | Status: AC | PRN
  Administered 2022-04-12: 50 ug via INTRAVENOUS

## 2022-04-12 MED ORDER — POTASSIUM CHLORIDE CRYS ER 20 MEQ PO TBCR
40.0000 meq | EXTENDED_RELEASE_TABLET | Freq: Once | ORAL | Status: AC
  Administered 2022-04-12: 40 meq via ORAL
  Filled 2022-04-12: qty 2

## 2022-04-12 MED ORDER — LIDOCAINE HCL (PF) 1 % IJ SOLN
10.0000 mL | Freq: Once | INTRAMUSCULAR | Status: AC
  Administered 2022-04-12: 10 mL via INTRADERMAL
  Filled 2022-04-12: qty 10

## 2022-04-12 MED ORDER — POTASSIUM CHLORIDE 10 MEQ/100ML IV SOLN
10.0000 meq | INTRAVENOUS | Status: AC
  Administered 2022-04-12 (×4): 10 meq via INTRAVENOUS
  Filled 2022-04-12 (×4): qty 100

## 2022-04-12 MED ORDER — PANTOPRAZOLE SODIUM 40 MG PO TBEC: 40.0000 mg | DELAYED_RELEASE_TABLET | Freq: Two times a day (BID) | ORAL | 0 refills | Status: DC

## 2022-04-12 MED ORDER — MIDAZOLAM HCL 2 MG/2ML IJ SOLN
INTRAMUSCULAR | Status: AC | PRN
  Administered 2022-04-12: 1 mg via INTRAVENOUS

## 2022-04-12 MED ORDER — FENTANYL CITRATE (PF) 100 MCG/2ML IJ SOLN
INTRAMUSCULAR | Status: AC
  Filled 2022-04-12: qty 4

## 2022-04-12 NOTE — Discharge Summary (Signed)
Patient ID: Allison Alvarez MRN: [id number] DOB/AGE: 05/11/1960 61 y.o.  Admit date: 04/08/2022 Discharge date: 04/12/2022   Discharge Diagnoses:  Principal Problem:   Perforation of intestine [facility]) Active Problems:   Gastric perforation, acute   Procedures: Robotic assisted laparoscopic duodenal ulcer repair  Hospital Course: Patient admitted with duodenal ulcer perforation. She underwent duodenal ulcer repair. Recovered well. Developed expected intra abdominal abscess due to abundant amount of purulent peritonitis. Drained percutaneously. No fever during admission. Completed antibiotic therapy. Treated with high dose PPI. Ambulating. Pain controlled. Tolerating soft diet.    Physical Exam Vitals reviewed.  HENT:     Head: Normocephalic.  Cardiovascular:     Rate and Rhythm: Normal rate and regular rhythm.  Pulmonary:     Effort: Pulmonary effort is normal.     Breath sounds: Normal breath sounds.  Abdominal:     General: Abdomen is flat. There is no distension.     Tenderness: There is no abdominal tenderness. There is no guarding.  Musculoskeletal:     Cervical back: Normal range of motion.  Skin:    Capillary Refill: Capillary refill takes less than 2 seconds.  Neurological:     General: No focal deficit present.     Mental Status: She is alert.   Wounds are dry and clean.    Consults: Intervention radiologist.   Disposition: Discharge disposition: 01-Home or Self Care       Discharge Instructions     Diet - low sodium heart healthy   Complete by: As directed    Increase activity slowly   Complete by: As directed       Allergies as of 04/12/2022       Reactions   Morphine Itching   Hydromorphone Itching        Medication List     STOP taking these medications    meloxicam 15 MG tablet Commonly known as: MOBIC       TAKE these medications    acetaminophen 325 MG tablet Commonly known as: TYLENOL Take 650 mg by mouth every 6 (six)  hours as needed for moderate pain.   amLODipine 2.5 MG tablet Commonly known as: NORVASC Take 1 tablet (2.5 mg total) by mouth 2 (two) times daily.   amphetamine-dextroamphetamine 25 MG 24 hr capsule Commonly known as: Adderall XR Take 1 capsule by mouth daily.   B-12 SL Place 700 mcg under the tongue daily.   busPIRone 10 MG tablet Commonly known as: BUSPAR Take 10 mg by mouth 2 (two) times daily.   citalopram 40 MG tablet Commonly known as: CELEXA Take 40 mg by mouth in the morning.   cyanocobalamin 1000 MCG/ML injection Commonly known as: VITAMIN B12 Inject 1 mL (1,000 mcg total) into the muscle every 30 (thirty) days.   diazepam 5 MG tablet Commonly known as: VALIUM TAKE ONE TABLET BY MOUTH EVERY 12 HOURS AS NEEDED FOR ANXIETY   gabapentin 300 MG capsule Commonly known as: NEURONTIN Take 300 mg by mouth every evening.   hydrochlorothiazide 25 MG tablet Commonly known as: HYDRODIURIL Take 1 tablet (25 mg total) by mouth daily.   HYDROcodone-acetaminophen 5-325 MG tablet Commonly known as: Norco Take 1 tablet by mouth every 4 (four) hours as needed for up to 3 days for moderate pain.   ICY HOT EX Apply 1 application topically daily as needed (leg pain).   losartan 100 MG tablet Commonly known as: COZAAR Take 100 mg by mouth in the morning.   metoprolol tartrate 25  MG tablet Commonly known as: LOPRESSOR Take 1 tablet (25 mg total) by mouth 2 (two) times daily.   pantoprazole 40 MG tablet Commonly known as: Protonix Take 1 tablet (40 mg total) by mouth 2 (two) times daily.   tiZANidine 2 MG tablet Commonly known as: ZANAFLEX Take 1 tablet (2 mg total) by mouth every 6 (six) hours as needed for muscle spasms.   traZODone 150 MG tablet Commonly known as: DESYREL Take 75 mg by mouth at bedtime.        Follow-up Information     Carolan Shiver, MD Follow up on 04/14/2022.   Specialty: General Surgery Why: Appointemnt for drain removal at  4:15pm Contact information: [address] [place] [address] [phone]

## 2022-04-12 NOTE — Plan of Care (Signed)

## 2022-04-12 NOTE — OR Nursing (Signed)
Pt received abscess drain during procedure sedated with febntanyl 50 mcg and versed 1 mg. Pt alert and oriented throughout procedure. Transferred back to 136 hand off given to [place]. She reported comfort in patient mentation and vs. 90 ml yellow cloudy fluid drained by Lisabeth Pick from charged JP drain,.

## 2022-04-12 NOTE — Procedures (Signed)
Interventional Radiology Procedure Note  Procedure: CT DRAIN PELVIC FLD COLLECTION    Complications: None  Estimated Blood Loss:  0  Findings: 20CC SEROSANG FLD ASPIRATED CX SENT    Sharen Counter, MD

## 2022-04-13 ENCOUNTER — Encounter: Payer: Self-pay | Admitting: Surgery

## 2022-04-17 LAB — AEROBIC/ANAEROBIC CULTURE W GRAM STAIN (SURGICAL/DEEP WOUND): Culture: NO GROWTH

## 2022-04-21 ENCOUNTER — Ambulatory Visit: Payer: BC Managed Care – PPO | Admitting: Internal Medicine

## 2022-04-21 ENCOUNTER — Encounter: Payer: Self-pay | Admitting: Internal Medicine

## 2022-04-21 VITALS — BP 106/68 | HR 73 | Temp 98.5°F | Ht 68.0 in | Wt 165.6 lb

## 2022-04-21 DIAGNOSIS — F411 Generalized anxiety disorder: Secondary | ICD-10-CM

## 2022-04-21 DIAGNOSIS — K3189 Other diseases of stomach and duodenum: Secondary | ICD-10-CM

## 2022-04-21 DIAGNOSIS — N739 Female pelvic inflammatory disease, unspecified: Secondary | ICD-10-CM

## 2022-04-21 DIAGNOSIS — R1319 Other dysphagia: Secondary | ICD-10-CM

## 2022-04-21 DIAGNOSIS — Z8719 Personal history of other diseases of the digestive system: Secondary | ICD-10-CM

## 2022-04-21 DIAGNOSIS — R9431 Abnormal electrocardiogram [ECG] [EKG]: Secondary | ICD-10-CM | POA: Insufficient documentation

## 2022-04-21 DIAGNOSIS — K5732 Diverticulitis of large intestine without perforation or abscess without bleeding: Secondary | ICD-10-CM

## 2022-04-21 DIAGNOSIS — G8929 Other chronic pain: Secondary | ICD-10-CM

## 2022-04-21 HISTORY — DX: Female pelvic inflammatory disease, unspecified: N73.9

## 2022-04-21 LAB — COMPREHENSIVE METABOLIC PANEL
ALT: 20 U/L (ref 0–35)
AST: 15 U/L (ref 0–37)
Albumin: 3.7 g/dL (ref 3.5–5.2)
Alkaline Phosphatase: 93 U/L (ref 39–117)
BUN: 12 mg/dL (ref 6–23)
CO2: 31 mEq/L (ref 19–32)
Calcium: 9.1 mg/dL (ref 8.4–10.5)
Chloride: 99 mEq/L (ref 96–112)
Creatinine, Ser: 0.98 mg/dL (ref 0.40–1.20)
GFR: 62.99 mL/min (ref >60.00)
Glucose, Bld: 96 mg/dL (ref 70–99)
Potassium: 4.5 mEq/L (ref 3.5–5.1)
Sodium: 138 mEq/L (ref 135–145)
Total Bilirubin: 0.3 mg/dL (ref 0.2–1.2)
Total Protein: 6.7 g/dL (ref 6.0–8.3)

## 2022-04-21 LAB — CBC WITH DIFFERENTIAL/PLATELET
Basophils Absolute: 0.1 10*3/uL (ref 0.0–0.1)
Basophils Relative: 1.1 % (ref 0.0–3.0)
Eosinophils Absolute: 0.5 10*3/uL (ref 0.0–0.7)
Eosinophils Relative: 5.4 % — ABNORMAL HIGH (ref 0.0–5.0)
HCT: 31.3 % — ABNORMAL LOW (ref 36.0–46.0)
Hemoglobin: 10.6 g/dL — ABNORMAL LOW (ref 12.0–15.0)
Lymphocytes Relative: 18.9 % (ref 12.0–46.0)
Lymphs Abs: 1.8 10*3/uL (ref 0.7–4.0)
MCHC: 33.8 g/dL (ref 30.0–36.0)
MCV: 97.3 fl (ref 78.0–100.0)
Monocytes Absolute: 0.6 10*3/uL (ref 0.1–1.0)
Monocytes Relative: 5.8 % (ref 3.0–12.0)
Neutro Abs: 6.7 10*3/uL (ref 1.4–7.7)
Neutrophils Relative %: 68.8 % (ref 43.0–77.0)
Platelets: 683 10*3/uL — ABNORMAL HIGH (ref 150.0–400.0)
RBC: 3.22 Mil/uL — ABNORMAL LOW (ref 3.87–5.11)
RDW: 13.2 % (ref 11.5–15.5)
WBC: 9.7 10*3/uL (ref 4.0–10.5)

## 2022-04-21 LAB — MAGNESIUM: Magnesium: 2.1 mg/dL (ref 1.5–2.5)

## 2022-04-21 MED ORDER — BUSPIRONE HCL 15 MG PO TABS: 15.0000 mg | ORAL_TABLET | Freq: Two times a day (BID) | ORAL | 2 refills | Status: DC

## 2022-04-21 MED ORDER — TRAZODONE HCL 150 MG PO TABS: 75.0000 mg | ORAL_TABLET | Freq: Every day | ORAL | 1 refills | Status: DC

## 2022-04-21 MED ORDER — TRAMADOL HCL 50 MG PO TABS: 50.0000 mg | ORAL_TABLET | Freq: Four times a day (QID) | ORAL | 0 refills | Status: AC | PRN

## 2022-04-21 NOTE — Assessment & Plan Note (Addendum)
She underwent laparoscopic repair of a perforated duodenal ulcer caused by chronic use of NSAIDs (meloxicam and ibuprofen) continue PPI bid .  GI referral made

## 2022-04-21 NOTE — Assessment & Plan Note (Signed)
Normal DG swallow evaluation

## 2022-04-21 NOTE — Assessment & Plan Note (Signed)
Reviewed recent EKG done during hospitalizatio for perforated duodenal ulcer.  TWI in septal leads noted (new).  RBBB chronic.  Repeat in one month

## 2022-04-21 NOTE — Progress Notes (Signed)
Subjective:  Patient ID: Allison Alvarez, female    DOB: Jun 26, 1960  Age: 62 y.o. MRN: [id number]  CC: The primary encounter diagnosis was History of duodenal ulcer. Diagnoses of Abnormal electrocardiogram (ECG) (EKG), Esophageal dysphagia, Gastric perforation, acute, Pelvic abscess in female, GAD (generalized anxiety disorder), Diverticulitis of colon, and Other chronic pain were also pertinent to this visit.   HPI [name] presents for follow up om anxiety  recent hospitalization Chief Complaint  Patient presents with   Medical Management of Chronic Issues    Follow from ED and discuss anxiety medication   [name] was hospitalized from Dec 27 to Dec 31 for management of duodenal ulcer that ruptured .  Cause of rupture was determined to be due to simultaneous use of meloxicam and ibuprofen for several months  (for concurrent management of  ankle pain , mid thoracic back pain and concussion,  which she states was condoned by one of her physicians! ) .  HX:  developed severe  cramping abdominal pain  Dec 27,  taken to ER   She underwent laparoscopic surgical correction by Cintron diaz.  Following the surgery she was noted to have a pelvic abscess that required drainage  Using tylenol and nighttime hydrocodone /apap  to use at night for the back /drain pain.  Has 2 of 16 left   Follow up with surgery 1/4 reviewed:  She has been recovering adequately. Before discharge she had an upper GI that shows no leak from repair area. She did develop a pelvic abscess that was underwent a Successful CT-guided right trans gluteal pelvic abscess drain Placement prior to discharge   A repeat CT is in the process of being scheduled to determine if drain needs to be removed or repositioned    Diverticulosis:  Bowels are moving easily . Prior to perforation she was having recurrent episodes  of diarrhea  (likely due to blood in GI tract)   Anxiety managed with diazepam, citalopram and buspirone,  discussed  increasing his dose of buspirone .  Husband and aging parents stressing him out. Leaning on her faith more and more  .  Wants to start yoga.  .  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     amLODipine (NORVASC) 2.5 MG tablet Take 1 tablet (2.5 mg total) by mouth 2 (two) times daily. 180 tablet 1   amphetamine-dextroamphetamine (ADDERALL XR) 25 MG 24 hr capsule Take 1 capsule by mouth daily. 90 capsule 0   citalopram (CELEXA) 40 MG tablet Take 40 mg by mouth in the morning.     cyanocobalamin (VITAMIN B12) 1000 MCG/ML injection Inject 1 mL (1,000 mcg total) into the muscle every 30 (thirty) days. 1 mL 11   diazepam (VALIUM) 5 MG tablet TAKE ONE TABLET BY MOUTH EVERY 12 HOURS AS NEEDED FOR ANXIETY 60 tablet 0   gabapentin (NEURONTIN) 300 MG capsule Take 300 mg by mouth every evening.     hydrochlorothiazide (HYDRODIURIL) 25 MG tablet Take 1 tablet (25 mg total) by mouth daily. 90 tablet 3   losartan (COZAAR) 100 MG tablet Take 100 mg by mouth in the morning.     Menthol, Topical Analgesic, (ICY HOT  EX) Apply 1 application topically daily as needed (leg pain).     metoprolol tartrate (LOPRESSOR) 25 MG tablet Take 1 tablet (25 mg total) by mouth 2 (two) times daily. 90 tablet 0   pantoprazole (PROTONIX) 40 MG tablet Take 1 tablet (40 mg total) by mouth 2 (two) times daily. 60 tablet 0   busPIRone (BUSPAR) 10 MG tablet Take 10 mg by mouth 2 (two) times daily.     traZODone (DESYREL) 150 MG tablet Take 75 mg by mouth at bedtime.     Cyanocobalamin (B-12 SL) Place 700 mcg under the tongue daily. (Patient not taking: Reported on 04/08/2022)     tiZANidine (ZANAFLEX) 2 MG tablet Take 1 tablet (2 mg  total) by mouth every 6 (six) hours as needed for muscle spasms. (Patient not taking: Reported on 04/21/2022) 60 tablet 0   No facility-administered medications prior to visit.    Review of Systems;  Patient denies headache, fevers, malaise, unintentional weight loss, skin rash, eye pain, sinus congestion and sinus pain, sore throat, dysphagia,  hemoptysis , cough, dyspnea, wheezing, chest pain, palpitations, orthopnea, edema, abdominal pain, nausea, melena, diarrhea, constipation, flank pain, dysuria, hematuria, urinary  Frequency, nocturia, numbness, tingling, seizures,  Focal weakness, Loss of consciousness,  Tremor, insomnia, depression, anxiety, and suicidal ideation.      Objective:  BP 106/68   Pulse 73   Temp 98.5 F (36.9 C) (Oral)   Ht 5\' 8"  (1.727 m)   Wt 165 lb 9.6 oz (75.1 kg)   SpO2 96%   BMI 25.18 kg/m   BP Readings from Last 3 Encounters:  04/21/22 106/68  04/12/22 111/64  02/20/22 116/78    Wt Readings from Last 3 Encounters:  04/21/22 165 lb 9.6 oz (75.1 kg)  04/08/22 170 lb (77.1 kg)  02/20/22 176 lb (79.8 kg)    Physical Exam Vitals reviewed.  Constitutional:      General: She is not in acute distress.    Appearance: Normal appearance. She is normal weight. She is not ill-appearing, toxic-appearing or diaphoretic.  HENT:     Head: Normocephalic.  Eyes:     General: No scleral icterus.       Right eye: No discharge.        Left eye: No discharge.     Conjunctiva/sclera: Conjunctivae normal.  Cardiovascular:     Rate and Rhythm: Normal rate and regular rhythm.     Heart sounds: Normal heart sounds.  Pulmonary:     Effort: Pulmonary effort is normal. No respiratory distress.     Breath sounds: Normal breath sounds.  Musculoskeletal:        General: Normal range of motion.  Skin:    General: Skin is warm and dry.  Neurological:     General: No focal deficit present.     Mental Status: She is alert and oriented to person, place, and time. Mental  status is at baseline.  Psychiatric:        Mood and Affect: Mood normal.        Behavior: Behavior normal.        Thought Content: Thought content normal.        Judgment: Judgment normal.     Lab Results  Component Value Date   HGBA1C 5.5 09/17/2021    Lab Results  Component Value Date   CREATININE 0.98 04/21/2022   CREATININE 0.95 04/12/2022   CREATININE 0.99 04/10/2022    Lab Results  Component Value Date   WBC 9.7  04/21/2022   HGB 10.6 (L) 04/21/2022   HCT 31.3 (L) 04/21/2022   PLT 683.0 (H) 04/21/2022   GLUCOSE 96 04/21/2022   CHOL 208 (H) 09/17/2021   TRIG 158.0 (H) 09/17/2021   HDL 53.00 09/17/2021   LDLCALC 123 (H) 09/17/2021   ALT 20 04/21/2022   AST 15 04/21/2022   NA 138 04/21/2022   K 4.5 04/21/2022   CL 99 04/21/2022   CREATININE 0.98 04/21/2022   BUN 12 04/21/2022   CO2 31 04/21/2022   TSH 1.03 09/17/2021   INR 1.1 04/12/2022   HGBA1C 5.5 09/17/2021    DG UGI W SINGLE CM (SOL OR THIN BA)  Result Date: 04/09/2022 CLINICAL DATA:  Duodenal perforation repair. EXAM: WATER SOLUBLE UPPER GI SERIES TECHNIQUE: Single-column upper GI series was performed using water soluble contrast. Radiation Exposure Index (as provided by the fluoroscopic device): 11.1 mGy CONTRAST:  50 mL Omnipaque 300 COMPARISON:  None Available. FLUOROSCOPY: Fluoroscopy Time:  1 minute 6 seconds Radiation Exposure Index (if provided by the fluoroscopic device): 11.1 mGy Number of Acquired Spot Images: 0 FINDINGS: UPPER GI SERIES: Examination of the esophagus demonstrated normal esophageal motility. Normal esophageal morphology without evidence of esophagitis or ulceration. No esophageal stricture, diverticula, or mass lesion. No evidence of hiatal hernia. No spontaneous or inducible gastroesophageal reflux. Examination of the stomach demonstrated normal rugal folds and areae gastricae. Gastric mucosa appeared grossly normal without evidence of ulceration, scarring, or mass lesion. Gastric  motility and emptying was normal. Fluoroscopic examination of the duodenum demonstrates no extraluminal contrast to suggest perforation. IMPRESSION: 1. No extraluminal contrast to suggest duodenal perforation or leak. Electronically Signed   By: Kathreen Devoid M.D.   On: 04/09/2022 13:25   CT ABDOMEN PELVIS W CONTRAST  Result Date: 04/08/2022 CLINICAL DATA:  Acute generalized abdominal pain. EXAM: CT ABDOMEN AND PELVIS WITH CONTRAST TECHNIQUE: Multidetector CT imaging of the abdomen and pelvis was performed using the standard protocol following bolus administration of intravenous contrast. RADIATION DOSE REDUCTION: This exam was performed according to the departmental dose-optimization program which includes automated exposure control, adjustment of the mA and/or kV according to patient size and/or use of iterative reconstruction technique. CONTRAST:  115mL OMNIPAQUE IOHEXOL 300 MG/ML  SOLN COMPARISON:  September 25, 2021. FINDINGS: Lower chest: No acute abnormality. Hepatobiliary: No focal liver abnormality is seen. Status post cholecystectomy. No biliary dilatation. Pancreas: Unremarkable. No pancreatic ductal dilatation or surrounding inflammatory changes. Spleen: Normal in size without focal abnormality. Adrenals/Urinary Tract: Adrenal glands are unremarkable. Kidneys are normal, without renal calculi, focal lesion, or hydronephrosis. Bladder is unremarkable. Stomach/Bowel: status post appendectomy. Postsurgical changes are seen involving sigmoid colon. There is no evidence of bowel obstruction. Free air is noted in the epigastric region concerning for rupture of hollow viscus. There appears to be wall thickening of the distal stomach and proximal duodenum with possible ulceration in the duodenum which may be source of pneumoperitoneum. Vascular/Lymphatic: Aortic atherosclerosis. No enlarged abdominal or pelvic lymph nodes. Reproductive: Status post hysterectomy. No adnexal masses. Other: Mild amount of free fluid  or ascites is noted in the pelvis and around the liver and spleen. No hernia is noted. Musculoskeletal: No acute or significant osseous findings. IMPRESSION: Pneumoperitoneum is noted, most likely due to perforated ulcer involving proximal duodenum. Mild amount of free fluid or ascites is noted in the pelvis and around the liver and spleen. Critical Value/emergent results were called by telephone at the time of interpretation on 04/08/2022 at 1:32 pm to provider [facility] ,  who verbally acknowledged these results. Electronically Signed   By: Lupita Raider M.D.   On: 04/08/2022 13:33    Assessment & Plan:  .History of duodenal ulcer -     Ambulatory referral to Gastroenterology -     Magnesium -     Comprehensive metabolic panel -     CBC with Differential/Platelet  Abnormal electrocardiogram (ECG) (EKG) Assessment & Plan: Reviewed recent EKG done during hospitalizatio for perforated duodenal ulcer.  TWI in septal leads noted (new).  RBBB chronic.  Repeat in one month    Esophageal dysphagia Assessment & Plan: Normal DG swallow evaluation   Gastric perforation, acute Assessment & Plan: She underwent laparoscopic repair of a perforated duodenal ulcer caused by chronic use of NSAIDs (meloxicam and ibuprofen) continue PPI bid .  GI referral made    Pelvic abscess in female Assessment & Plan: Attributed to duodenal perforation.  She underwent drainage via IR and still has a drain in place.  Repeat CT is planned to determine is drain needs repositioning   GAD (generalized anxiety disorder) Assessment & Plan: Increasimg buspirone dose to manage increased anxiety related to husband and parent's health issues   Diverticulitis of colon  Other chronic pain Assessment & Plan: Adding tramadol to manage back pain aggravated by current drain   Other orders -     traZODone HCl; Take 0.5 tablets (75 mg total) by mouth at bedtime.  Dispense: 90 tablet; Refill: 1 -     traMADol HCl;  Take 1 tablet (50 mg total) by mouth every 6 (six) hours as needed for up to 5 days.  Dispense: 28 tablet; Refill: 0 -     busPIRone HCl; Take 1 tablet (15 mg total) by mouth 2 (two) times daily.  Dispense: 60 tablet; Refill: 2     I provided 40 minutes of face-to-face time during this encounter reviewing patient's last visit with me, patient's  recent hospitalization and follow up appointment with general surgery recent surgical and non surgical procedures, previous  labs and imaging studies, counseling on currently addressed issues,  and post visit ordering to diagnostics and therapeutics .   Follow-up: Return in about 4 weeks (around 05/19/2022).   Sherlene Shams, MD

## 2022-04-21 NOTE — Assessment & Plan Note (Signed)
Adding tramadol to manage back pain aggravated by current drain

## 2022-04-21 NOTE — Assessment & Plan Note (Signed)
Attributed to duodenal perforation.  She underwent drainage via IR and still has a drain in place.  Repeat CT is planned to determine is drain needs repositioning

## 2022-04-21 NOTE — Patient Instructions (Addendum)
You can take up to 2000 mg of acetominophen (tylenol) every day safely  In divided doses (500 mg every 6 hours  Or 1000 mg every 12 hours.)   I will call in tramadol to use after the hydrocodone has been used up.   Increase buspirone dose to 15 mg twice daily and continue citalopram and trazodone

## 2022-04-21 NOTE — Assessment & Plan Note (Signed)
Increasimg buspirone dose to manage increased anxiety related to husband and parent's health issues

## 2022-04-22 ENCOUNTER — Encounter: Payer: Self-pay | Admitting: Internal Medicine

## 2022-04-22 ENCOUNTER — Other Ambulatory Visit: Payer: Self-pay | Admitting: General Surgery

## 2022-04-22 DIAGNOSIS — N739 Female pelvic inflammatory disease, unspecified: Secondary | ICD-10-CM

## 2022-04-22 NOTE — Telephone Encounter (Signed)
° ° °  Pt would like a call back about her labs °

## 2022-04-23 ENCOUNTER — Encounter: Payer: Self-pay | Admitting: Radiology

## 2022-04-23 ENCOUNTER — Inpatient Hospital Stay
Admission: EM | Admit: 2022-04-23 | Discharge: 2022-04-25 | DRG: 372 | Disposition: A | Discharge status: Home / Self Care | Payer: BC Managed Care – PPO | Attending: Internal Medicine | Admitting: Internal Medicine

## 2022-04-23 ENCOUNTER — Inpatient Hospital Stay: Payer: BC Managed Care – PPO

## 2022-04-23 ENCOUNTER — Emergency Department: Payer: BC Managed Care – PPO

## 2022-04-23 DIAGNOSIS — I10 Essential (primary) hypertension: Secondary | ICD-10-CM | POA: Diagnosis not present

## 2022-04-23 DIAGNOSIS — Z87891 Personal history of nicotine dependence: Secondary | ICD-10-CM | POA: Diagnosis not present

## 2022-04-23 DIAGNOSIS — A419 Sepsis, unspecified organism: Secondary | ICD-10-CM | POA: Diagnosis not present

## 2022-04-23 DIAGNOSIS — E876 Hypokalemia: Secondary | ICD-10-CM

## 2022-04-23 DIAGNOSIS — K2970 Gastritis, unspecified, without bleeding: Secondary | ICD-10-CM | POA: Diagnosis not present

## 2022-04-23 DIAGNOSIS — R509 Fever, unspecified: Secondary | ICD-10-CM | POA: Diagnosis not present

## 2022-04-23 DIAGNOSIS — Z79899 Other long term (current) drug therapy: Secondary | ICD-10-CM | POA: Diagnosis not present

## 2022-04-23 DIAGNOSIS — K651 Peritoneal abscess: Principal | ICD-10-CM | POA: Diagnosis present

## 2022-04-23 DIAGNOSIS — K279 Peptic ulcer, site unspecified, unspecified as acute or chronic, without hemorrhage or perforation: Secondary | ICD-10-CM | POA: Diagnosis not present

## 2022-04-23 DIAGNOSIS — Z8711 Personal history of peptic ulcer disease: Secondary | ICD-10-CM

## 2022-04-23 DIAGNOSIS — Z8249 Family history of ischemic heart disease and other diseases of the circulatory system: Secondary | ICD-10-CM

## 2022-04-23 DIAGNOSIS — K265 Chronic or unspecified duodenal ulcer with perforation: Secondary | ICD-10-CM

## 2022-04-23 DIAGNOSIS — K2980 Duodenitis without bleeding: Secondary | ICD-10-CM | POA: Diagnosis not present

## 2022-04-23 DIAGNOSIS — K219 Gastro-esophageal reflux disease without esophagitis: Secondary | ICD-10-CM | POA: Diagnosis not present

## 2022-04-23 DIAGNOSIS — Z885 Allergy status to narcotic agent status: Secondary | ICD-10-CM

## 2022-04-23 DIAGNOSIS — F419 Anxiety disorder, unspecified: Secondary | ICD-10-CM | POA: Diagnosis not present

## 2022-04-23 DIAGNOSIS — Z818 Family history of other mental and behavioral disorders: Secondary | ICD-10-CM | POA: Diagnosis not present

## 2022-04-23 DIAGNOSIS — E785 Hyperlipidemia, unspecified: Secondary | ICD-10-CM | POA: Diagnosis not present

## 2022-04-23 DIAGNOSIS — Z90710 Acquired absence of both cervix and uterus: Secondary | ICD-10-CM

## 2022-04-23 DIAGNOSIS — Z1152 Encounter for screening for COVID-19: Secondary | ICD-10-CM

## 2022-04-23 DIAGNOSIS — E871 Hypo-osmolality and hyponatremia: Secondary | ICD-10-CM | POA: Diagnosis not present

## 2022-04-23 DIAGNOSIS — K3189 Other diseases of stomach and duodenum: Secondary | ICD-10-CM | POA: Diagnosis not present

## 2022-04-23 DIAGNOSIS — J9811 Atelectasis: Secondary | ICD-10-CM | POA: Diagnosis not present

## 2022-04-23 HISTORY — DX: Chronic or unspecified duodenal ulcer with perforation: K26.5

## 2022-04-23 LAB — URINALYSIS, ROUTINE W REFLEX MICROSCOPIC
Bacteria, UA: NONE SEEN
Bilirubin Urine: NEGATIVE
Glucose, UA: NEGATIVE mg/dL
Ketones, ur: NEGATIVE mg/dL
Nitrite: NEGATIVE
Protein, ur: NEGATIVE mg/dL
Specific Gravity, Urine: 1.02 (ref 1.005–1.030)
pH: 6 (ref 5.0–8.0)

## 2022-04-23 LAB — SODIUM, URINE, RANDOM: Sodium, Ur: 60 mmol/L

## 2022-04-23 LAB — COMPREHENSIVE METABOLIC PANEL
ALT: 18 U/L (ref 0–44)
AST: 22 U/L (ref 15–41)
Albumin: 3.5 g/dL (ref 3.5–5.0)
Alkaline Phosphatase: 101 U/L (ref 38–126)
Anion gap: 10 (ref 5–15)
BUN: 11 mg/dL (ref 6–20)
CO2: 26 mmol/L (ref 22–32)
Calcium: 9.1 mg/dL (ref 8.9–10.3)
Chloride: 94 mmol/L — ABNORMAL LOW (ref 98–111)
Creatinine, Ser: 1.16 mg/dL — ABNORMAL HIGH (ref 0.44–1.00)
GFR, Estimated: 54 mL/min — ABNORMAL LOW (ref >60)
Glucose, Bld: 117 mg/dL — ABNORMAL HIGH (ref 70–99)
Potassium: 3.8 mmol/L (ref 3.5–5.1)
Sodium: 130 mmol/L — ABNORMAL LOW (ref 135–145)
Total Bilirubin: 1.2 mg/dL (ref 0.3–1.2)
Total Protein: 7.8 g/dL (ref 6.5–8.1)

## 2022-04-23 LAB — CBC
HCT: 32.9 % — ABNORMAL LOW (ref 36.0–46.0)
Hemoglobin: 10.7 g/dL — ABNORMAL LOW (ref 12.0–15.0)
MCH: 31.9 pg (ref 26.0–34.0)
MCHC: 32.5 g/dL (ref 30.0–36.0)
MCV: 98.2 fL (ref 80.0–100.0)
Platelets: 669 10*3/uL — ABNORMAL HIGH (ref 150–400)
RBC: 3.35 MIL/uL — ABNORMAL LOW (ref 3.87–5.11)
RDW: 12.4 % (ref 11.5–15.5)
WBC: 16.3 10*3/uL — ABNORMAL HIGH (ref 4.0–10.5)
nRBC: 0 % (ref 0.0–0.2)

## 2022-04-23 LAB — LIPASE, BLOOD: Lipase: 37 U/L (ref 11–51)

## 2022-04-23 LAB — RESP PANEL BY RT-PCR (RSV, FLU A&B, COVID)  RVPGX2
Influenza A by PCR: NEGATIVE
Influenza B by PCR: NEGATIVE
Resp Syncytial Virus by PCR: NEGATIVE
SARS Coronavirus 2 by RT PCR: NEGATIVE

## 2022-04-23 LAB — LACTIC ACID, PLASMA
Lactic Acid, Venous: 0.8 mmol/L (ref 0.5–1.9)
Lactic Acid, Venous: 0.9 mmol/L (ref 0.5–1.9)

## 2022-04-23 LAB — GROUP A STREP BY PCR: Group A Strep by PCR: NOT DETECTED

## 2022-04-23 LAB — OSMOLALITY: Osmolality: 282 mOsm/kg (ref 275–295)

## 2022-04-23 MED ORDER — CITALOPRAM HYDROBROMIDE 20 MG PO TABS
40.0000 mg | ORAL_TABLET | Freq: Every morning | ORAL | Status: DC
  Administered 2022-04-24 – 2022-04-25 (×2): 40 mg via ORAL
  Filled 2022-04-23 (×2): qty 2

## 2022-04-23 MED ORDER — ACETAMINOPHEN 325 MG PO TABS
650.0000 mg | ORAL_TABLET | Freq: Four times a day (QID) | ORAL | Status: DC | PRN
  Administered 2022-04-24: 650 mg via ORAL
  Filled 2022-04-23 (×2): qty 2

## 2022-04-23 MED ORDER — DOCUSATE SODIUM 100 MG PO CAPS
100.0000 mg | ORAL_CAPSULE | Freq: Two times a day (BID) | ORAL | Status: DC
  Administered 2022-04-23 – 2022-04-25 (×4): 100 mg via ORAL
  Filled 2022-04-23 (×4): qty 1

## 2022-04-23 MED ORDER — LACTATED RINGERS IV BOLUS
1000.0000 mL | Freq: Once | INTRAVENOUS | Status: AC
  Administered 2022-04-23: 1000 mL via INTRAVENOUS

## 2022-04-23 MED ORDER — SODIUM CHLORIDE 0.9 % IV SOLN
2.0000 g | Freq: Once | INTRAVENOUS | Status: AC
  Administered 2022-04-23: 2 g via INTRAVENOUS
  Filled 2022-04-23: qty 12.5

## 2022-04-23 MED ORDER — POLYETHYLENE GLYCOL 3350 17 G PO PACK: 17.0000 g | PACK | Freq: Every day | ORAL | Status: DC | PRN

## 2022-04-23 MED ORDER — SODIUM CHLORIDE 0.9 % IV BOLUS
500.0000 mL | Freq: Once | INTRAVENOUS | Status: AC
  Administered 2022-04-23: 500 mL via INTRAVENOUS

## 2022-04-23 MED ORDER — METRONIDAZOLE 500 MG/100ML IV SOLN
500.0000 mg | Freq: Once | INTRAVENOUS | Status: AC
  Administered 2022-04-23: 500 mg via INTRAVENOUS
  Filled 2022-04-23: qty 100

## 2022-04-23 MED ORDER — TRAZODONE HCL 50 MG PO TABS
75.0000 mg | ORAL_TABLET | Freq: Every day | ORAL | Status: DC
  Administered 2022-04-23 – 2022-04-24 (×2): 75 mg via ORAL
  Filled 2022-04-23 (×2): qty 2

## 2022-04-23 MED ORDER — BUSPIRONE HCL 10 MG PO TABS
15.0000 mg | ORAL_TABLET | Freq: Two times a day (BID) | ORAL | Status: DC
  Administered 2022-04-23 – 2022-04-25 (×4): 15 mg via ORAL
  Filled 2022-04-23 (×4): qty 2

## 2022-04-23 MED ORDER — HYDROCODONE-ACETAMINOPHEN 5-325 MG PO TABS
1.0000 | ORAL_TABLET | ORAL | Status: DC | PRN
  Administered 2022-04-24 (×2): 2 via ORAL
  Filled 2022-04-23 (×2): qty 2

## 2022-04-23 MED ORDER — IOHEXOL 300 MG/ML  SOLN
100.0000 mL | Freq: Once | INTRAMUSCULAR | Status: AC | PRN
  Administered 2022-04-23: 100 mL via INTRAVENOUS

## 2022-04-23 MED ORDER — FENTANYL CITRATE PF 50 MCG/ML IJ SOSY
50.0000 ug | PREFILLED_SYRINGE | Freq: Once | INTRAMUSCULAR | Status: AC
  Administered 2022-04-23: 50 ug via INTRAVENOUS
  Filled 2022-04-23: qty 1

## 2022-04-23 MED ORDER — SODIUM CHLORIDE 0.9 % IV BOLUS
1000.0000 mL | Freq: Once | INTRAVENOUS | Status: AC
  Administered 2022-04-23: 1000 mL via INTRAVENOUS

## 2022-04-23 MED ORDER — AMPHETAMINE-DEXTROAMPHET ER 5 MG PO CP24
25.0000 mg | ORAL_CAPSULE | Freq: Every day | ORAL | Status: DC
  Filled 2022-04-23 (×2): qty 5

## 2022-04-23 MED ORDER — ACETAMINOPHEN 650 MG RE SUPP: 650.0000 mg | Freq: Four times a day (QID) | RECTAL | Status: DC | PRN

## 2022-04-23 MED ORDER — SODIUM CHLORIDE 0.9 % IV SOLN: 2.0000 g | INTRAVENOUS | Status: DC

## 2022-04-23 MED ORDER — MORPHINE SULFATE (PF) 2 MG/ML IV SOLN
2.0000 mg | INTRAVENOUS | Status: DC | PRN
  Administered 2022-04-23: 2 mg via INTRAVENOUS
  Filled 2022-04-23: qty 1

## 2022-04-23 MED ORDER — PANTOPRAZOLE SODIUM 40 MG IV SOLR
40.0000 mg | Freq: Once | INTRAVENOUS | Status: AC
  Administered 2022-04-23: 40 mg via INTRAVENOUS
  Filled 2022-04-23: qty 10

## 2022-04-23 MED ORDER — DIAZEPAM 5 MG PO TABS
5.0000 mg | ORAL_TABLET | Freq: Two times a day (BID) | ORAL | Status: DC | PRN
  Administered 2022-04-24: 5 mg via ORAL
  Filled 2022-04-23: qty 1

## 2022-04-23 MED ORDER — PANTOPRAZOLE SODIUM 40 MG PO TBEC
40.0000 mg | DELAYED_RELEASE_TABLET | Freq: Two times a day (BID) | ORAL | Status: DC
  Administered 2022-04-23 – 2022-04-25 (×4): 40 mg via ORAL
  Filled 2022-04-23 (×4): qty 1

## 2022-04-23 MED ORDER — ENOXAPARIN SODIUM 40 MG/0.4ML IJ SOSY
40.0000 mg | PREFILLED_SYRINGE | INTRAMUSCULAR | Status: DC
  Administered 2022-04-24 – 2022-04-25 (×2): 40 mg via SUBCUTANEOUS
  Filled 2022-04-23 (×2): qty 0.4

## 2022-04-23 MED ORDER — DEXTROSE IN LACTATED RINGERS 5 % IV SOLN: INTRAVENOUS | Status: DC

## 2022-04-23 MED ORDER — ONDANSETRON HCL 4 MG/2ML IJ SOLN
4.0000 mg | Freq: Once | INTRAMUSCULAR | Status: AC
  Administered 2022-04-23: 4 mg via INTRAVENOUS
  Filled 2022-04-23: qty 2

## 2022-04-23 NOTE — Sepsis Progress Note (Signed)
Following per sepsis protocol   

## 2022-04-23 NOTE — Code Documentation (Signed)
CODE SEPSIS - PHARMACY COMMUNICATION  **Broad Spectrum Antibiotics should be administered within 1 hour of Sepsis diagnosis**  Time Code Sepsis Called/Page Received: 1914  Antibiotics Ordered: cefepime, flagyl  Time of 1st antibiotic administration: [place] ,PharmD Clinical Pharmacist  04/23/2022  7:16 PM

## 2022-04-23 NOTE — Assessment & Plan Note (Signed)
Check urine sodium as well as serum osmolality.  At this time given patient's sepsis and soft blood pressure we have to continue IV fluids.  My suspicion is that this is prerenal hyponatremia neutral resolved with IV fluids

## 2022-04-23 NOTE — ED Triage Notes (Signed)
Pt sts that she recently had surgery to repair her bowel. Pt sts that she has been having fever and weakness since last night.

## 2022-04-23 NOTE — Assessment & Plan Note (Addendum)
At this time I would say that I am not sure of the source.  Patient has several portals of entry including the drain site in the sacral area.  Blood cultures have been ordered I will continue following those and continue with empiric antibiotics at this time because the patient has marked leukocytosis and soft blood pressure. Check CXR - orderrd

## 2022-04-23 NOTE — Consult Note (Signed)
PHARMACY -  BRIEF ANTIBIOTIC NOTE   Pharmacy has received consult(s) for cefepime from an ED provider.  The patient's profile has been reviewed for ht/wt/allergies/indication/available labs.    One time order(s) placed for Cefepime 2g IV x1  Further antibiotics/pharmacy consults should be ordered by admitting physician if indicated.                       Thank you, Darrick Penna 04/23/2022  7:17 PM

## 2022-04-23 NOTE — H&P (Addendum)
History and Physical    Patient: Allison Alvarez KDT:[id number] DOB: 1960/06/08 DOA: 04/23/2022 DOS: the patient was seen and examined on 04/23/2022 PCP: Crecencio Mc, MD  Patient coming from: Home  Chief Complaint:  Chief Complaint  Patient presents with   Post-op Problem   HPI: Allison Alvarez is a 62 y.o. female with medical history significant of duodenal ulcer perforation in December 2023.  For this patient underwent duodenal ulcer repair unfortunately back in December her course was complicated by intra-abdominal abscess formation.  This was drained percutaneously including a drain with which she was discharged.  This drain exits in the right sacral area.  Patient was in her usual state of health till about 2 days ago.  Since then patient reports increasing pain "throbbing "in the right sacral drain area.  Patient does not report any discharge from there.  Last night patient reports getting up at night having increasing pain rapid breathing and "lightheadedness "no fall no chest pain no cough no fever at the time was reported.  Patient used the restroom at the time and went back to sleep.  Since getting up this morning patient does not report any breathing trouble no coughing she does report slight lightheadedness she is having a fever measured at 100.3 at home.  She reports slightly scratchy throat and slight right ear pain.  No nausea vomiting no diarrhea no anterior abdominal pain no rash on skin no burning when she pees.   Patient came to the ER for evaluation of fever. Review of Systems: As mentioned in the history of present illness. All other systems reviewed and are negative. Past Medical History:  Diagnosis Date   Allergy    Anxiety    Arthritis    Depression    Diverticulitis    Fibromyalgia    Hyperlipidemia    Hypertension    PONV (postoperative nausea and vomiting)    PVC (premature ventricular contraction)    Past Surgical History:  Procedure Laterality Date    ABDOMINAL HYSTERECTOMY  2007   APPENDECTOMY  2008   BLADDER REPAIR  2008   CHOLECYSTECTOMY     COLON RESECTION  11/2020   COLONOSCOPY     07/2016, 07/2020   ESOPHAGOGASTRODUODENOSCOPY  07/2020   TUBAL LIGATION  1996   VESICOVAGINAL FISTULA CLOSURE  2008   XI ROBOT ASSISTED DIAGNOSTIC LAPAROSCOPY N/A 04/08/2022   Procedure: XI ROBOT ASSISTED DUODENAL ULCER PERFORATION REPAIR;  Surgeon: Herbert Pun, MD;  Location: [facility] ORS;  Service: General;  Laterality: N/A;   Social History:  reports that she quit smoking about 2 years ago. Her smoking use included cigarettes. She smoked an average of 1 pack per day. She has never used smokeless tobacco. She reports current alcohol use of about 14.0 standard drinks of alcohol per week. She reports that she does not use drugs.  Allergies  Allergen Reactions   Morphine Itching   Hydromorphone Itching    Family History  Problem Relation Age of Onset   Hypertension Mother    Hyperlipidemia Mother    Depression Mother    Arthritis Mother    Mental illness Mother    Stroke Father    Early death Father    Depression Father    Arthritis Father    Mental illness Brother    Drug abuse Brother    Depression Brother    Cancer Brother        skin cancer   Arthritis Brother    Anxiety disorder Daughter  Anxiety disorder Son    Breast cancer Paternal Aunt 40   Hypertension Maternal Grandmother    Cancer Maternal Grandmother    Arthritis Maternal Grandmother    Diabetes Maternal Grandfather    Depression Maternal Grandfather    Cancer Paternal Grandfather     Prior to Admission medications   Medication Sig Start Date End Date Taking? Authorizing Provider  acetaminophen (TYLENOL) 325 MG tablet Take 650 mg by mouth every 6 (six) hours as needed for moderate pain.    [provider]  amLODipine (NORVASC) 2.5 MG tablet Take 1 tablet (2.5 mg total) by mouth 2 (two) times daily. 11/24/21   Sherlene Shams, MD   amphetamine-dextroamphetamine (ADDERALL XR) 25 MG 24 hr capsule Take 1 capsule by mouth daily. 02/20/22   Rodolph Bong, MD  busPIRone (BUSPAR) 15 MG tablet Take 1 tablet (15 mg total) by mouth 2 (two) times daily. 04/21/22   Sherlene Shams, MD  citalopram (CELEXA) 40 MG tablet Take 40 mg by mouth in the morning. 01/30/17   [provider]  cyanocobalamin (VITAMIN B12) 1000 MCG/ML injection Inject 1 mL (1,000 mcg total) into the muscle every 30 (thirty) days. 01/13/22   Sherlene Shams, MD  diazepam (VALIUM) 5 MG tablet TAKE ONE TABLET BY MOUTH EVERY 12 HOURS AS NEEDED FOR ANXIETY 03/24/22   Sherlene Shams, MD  gabapentin (NEURONTIN) 300 MG capsule Take 300 mg by mouth every evening. 11/12/17 04/21/22  [provider]  hydrochlorothiazide (HYDRODIURIL) 25 MG tablet Take 1 tablet (25 mg total) by mouth daily. 11/12/21   Sherlene Shams, MD  losartan (COZAAR) 100 MG tablet Take 100 mg by mouth in the morning. 07/30/20   [provider]  Menthol, Topical Analgesic, (ICY HOT EX) Apply 1 application topically daily as needed (leg pain).    [provider]  metoprolol tartrate (LOPRESSOR) 25 MG tablet Take 1 tablet (25 mg total) by mouth 2 (two) times daily. 04/03/22   Allegra Grana, FNP  pantoprazole (PROTONIX) 40 MG tablet Take 1 tablet (40 mg total) by mouth 2 (two) times daily. 04/12/22 05/12/22  Carolan Shiver, MD  traMADol (ULTRAM) 50 MG tablet Take 1 tablet (50 mg total) by mouth every 6 (six) hours as needed for up to 5 days. 04/21/22 04/26/22  Sherlene Shams, MD  traZODone (DESYREL) 150 MG tablet Take 0.5 tablets (75 mg total) by mouth at bedtime. 04/21/22   Sherlene Shams, MD    Physical Exam: Vitals:   04/23/22 1450 04/23/22 1451 04/23/22 1805 04/23/22 1900  BP: 106/73  (!) 93/57 (!) 99/53  Pulse: 86  77 86  Resp: 18  18 18   Temp: 99.2 F (37.3 C)     TempSrc: Oral     SpO2: 98%  97% 98%  Weight:  74.8 kg     Patient is accompanied by her husband.   Patient is alert awake giving coherent history does not appear to be toxic at this time. Alert awake no focal motor deficit Respiratory exam: Bilateral air entry vesicular Cardiovascular exam S1-S2 normal Abdomen anterior abdomen is soft nontender.  There is a drain in the right sacral area on the posterior aspect of the pelvis.  There is a dressing applied.  There is some direct tenderness.  But no spreading erythema is appreciated. Bilateral lower extremities without edema or any skin changes. Bilateral tympanic membranes are healthy, oropharynx is healthy Data Reviewed:  Results for orders placed or performed during the  hospital encounter of 04/23/22 (from the past 24 hour(s))  CBC     Status: Abnormal   Collection Time: 04/23/22  2:52 PM  Result Value Ref Range   WBC 16.3 (H) 4.0 - 10.5 K/uL   RBC 3.35 (L) 3.87 - 5.11 MIL/uL   Hemoglobin 10.7 (L) 12.0 - 15.0 g/dL   HCT 32.9 (L) 36.0 - 46.0 %   MCV 98.2 80.0 - 100.0 fL   MCH 31.9 26.0 - 34.0 pg   MCHC 32.5 30.0 - 36.0 g/dL   RDW 12.4 11.5 - 15.5 %   Platelets 669 (H) 150 - 400 K/uL   nRBC 0.0 0.0 - 0.2 %  Comprehensive metabolic panel     Status: Abnormal   Collection Time: 04/23/22  2:52 PM  Result Value Ref Range   Sodium 130 (L) 135 - 145 mmol/L   Potassium 3.8 3.5 - 5.1 mmol/L   Chloride 94 (L) 98 - 111 mmol/L   CO2 26 22 - 32 mmol/L   Glucose, Bld 117 (H) 70 - 99 mg/dL   BUN 11 6 - 20 mg/dL   Creatinine, Ser 1.16 (H) 0.44 - 1.00 mg/dL   Calcium 9.1 8.9 - 10.3 mg/dL   Total Protein 7.8 6.5 - 8.1 g/dL   Albumin 3.5 3.5 - 5.0 g/dL   AST 22 15 - 41 U/L   ALT 18 0 - 44 U/L   Alkaline Phosphatase 101 38 - 126 U/L   Total Bilirubin 1.2 0.3 - 1.2 mg/dL   GFR, Estimated 54 (L) >60 mL/min   Anion gap 10 5 - 15  Lipase, blood     Status: None   Collection Time: 04/23/22  2:52 PM  Result Value Ref Range   Lipase 37 11 - 51 U/L  Resp panel by RT-PCR (RSV, Flu A&B, Covid) Anterior Nasal Swab     Status: None   Collection  Time: 04/23/22  5:19 PM   Specimen: Anterior Nasal Swab  Result Value Ref Range   SARS Coronavirus 2 by RT PCR NEGATIVE NEGATIVE   Influenza A by PCR NEGATIVE NEGATIVE   Influenza B by PCR NEGATIVE NEGATIVE   Resp Syncytial Virus by PCR NEGATIVE NEGATIVE  Group A Strep by PCR ([facility] Only)     Status: None   Collection Time: 04/23/22  5:19 PM   Specimen: Throat; Sterile Swab  Result Value Ref Range   Group A Strep by PCR NOT DETECTED NOT DETECTED  Lactic acid, plasma     Status: None   Collection Time: 04/23/22  5:50 PM  Result Value Ref Range   Lactic Acid, Venous 0.9 0.5 - 1.9 mmol/L   IMPRESSION: 1. Interval resolution of pelvic fluid collection with percutaneous drainage catheter in place. 2. Persistent presacral edema and mild edema in the right pelvis. 3. Wall thickening of the anterior wall of the gastric antrum with surrounding inflammatory stranding and few small bubbles of free air. Findings are concerning for gastritis/peptic ulcer disease.  Assessment and Plan: * Fever At this time I would say that I am not sure of the source.  Patient has several portals of entry including the drain site in the sacral area.  Blood cultures have been ordered I will continue following those and continue with empiric antibiotics at this time because the patient has marked leukocytosis and soft blood pressure. Check CXR - orderrd  Hyponatremia Check urine sodium as well as serum osmolality.  At this time given patient's sepsis and soft blood pressure  we have to continue IV fluids.  My suspicion is that this is prerenal hyponatremia neutral resolved with IV fluids  Peptic ulcer Minimal gas in the anterior part of the gastric atrium.  At this time patient does not have any send symptoms in this area.  Surgeon has been engaged.  At this time we would say we are just trying to rule out microperforation.  Surgery has ordered oral contrast CAT scan.  We will follow that up keep the patient n.p.o.  till then  Sepsis [facility]) As mentioned the source is not entirely clear, could be related to the drain site, I will request a wound care evaluation for management of the drain.  At this time continue with IV fluids, monitor blood pressure monitor telemetry and empiric antibiotics as above  Hypertension, essential Hold antihypertensives of this patient.  Given her current blood pressure.  Please restart in the morning as appropriate      Advance Care Planning:   Code Status: Full Code full  Consults: Dr. Aleen Campi  Family Communication: husband at bedsdie.  Severity of Illness: The appropriate patient status for this patient is OBSERVATION. Observation status is judged to be reasonable and necessary in order to provide the required intensity of service to ensure the patient's safety. The patient's presenting symptoms, physical exam findings, and initial radiographic and laboratory data in the context of their medical condition is felt to place them at decreased risk for further clinical deterioration. Furthermore, it is anticipated that the patient will be medically stable for discharge from the hospital within 2 midnights of admission.   Author: Nolberto Hanlon, MD 04/23/2022 8:00 PM  For on call review [email or web address].

## 2022-04-23 NOTE — Assessment & Plan Note (Signed)
Hold antihypertensives of this patient.  Given her current blood pressure.  Please restart in the morning as appropriate

## 2022-04-23 NOTE — ED Provider Notes (Addendum)
[facility] Provider Note    Event Date/Time   First MD Initiated Contact with Patient 04/23/22 1644     (approximate)   History   Post-op Problem   HPI  Allison Alvarez is a 62 y.o. female who is status post a laparoscopic duodenal ulcer repair after having a duodenal ulcer perforation she did develop intra-abdominal abscess which was drained percutaneously.  She completed antibiotic therapy he was discharged on 12/31.  She comes back in today for fever and weakness.  Patient reports over the past couple days having increasing pain on the right low back where she has a drain that was placed.  Her daughter is a Marine scientist who took a look at the dressing and noticed low bit of redness around the drain.  She is been taking tramadol.  She reports a fever of 101 at home has not taken any fever reducers.  Physical Exam   Triage Vital Signs: ED Triage Vitals  Enc Vitals Group     BP 04/23/22 1450 106/73     Pulse Rate 04/23/22 1450 86     Resp 04/23/22 1450 18     Temp 04/23/22 1450 99.2 F (37.3 C)     Temp Source 04/23/22 1450 Oral     SpO2 04/23/22 1450 98 %     Weight 04/23/22 1451 165 lb (74.8 kg)     Height --      Head Circumference --      Peak Flow --      Pain Score 04/23/22 1450 5     Pain Loc --      Pain Edu? --      Excl. in [place]? --     Most recent vital signs: Vitals:   04/23/22 1450  BP: 106/73  Pulse: 86  Resp: 18  Temp: 99.2 F (37.3 C)  SpO2: 98%     General: Awake, no distress.  CV:  Good peripheral perfusion.  Resp:  Normal effort.  Abd:  No distention.  Other:  Patient has drain noted from the right flank with a little bit of redness around it.   ED Results / Procedures / Treatments   Labs (all labs ordered are listed, but only abnormal results are displayed) Labs Reviewed  CBC - Abnormal; Notable for the following components:      Result Value   WBC 16.3 (*)    RBC 3.35 (*)    Hemoglobin 10.7 (*)    HCT 32.9 (*)     Platelets 669 (*)    All other components within normal limits  COMPREHENSIVE METABOLIC PANEL - Abnormal; Notable for the following components:   Sodium 130 (*)    Chloride 94 (*)    Glucose, Bld 117 (*)    Creatinine, Ser 1.16 (*)    GFR, Estimated 54 (*)    All other components within normal limits     RADIOLOGY I have reviewed the CT personally and interpreted and she has got a ulcer noted   PROCEDURES:  Critical Care performed: No  .Critical Care  Performed by: Vanessa [place], MD Authorized by: Vanessa [place], MD   Critical care provider statement:    Critical care time (minutes):  30   Critical care was necessary to treat or prevent imminent or life-threatening deterioration of the following conditions:  Sepsis   Critical care was time spent personally by me on the following activities:  Development of treatment plan with patient or surrogate, discussions  with consultants, evaluation of patient's response to treatment, examination of patient, ordering and review of laboratory studies, ordering and review of radiographic studies, ordering and performing treatments and interventions, pulse oximetry, re-evaluation of patient's condition and review of old charts    MEDICATIONS ORDERED IN ED: Medications  pantoprazole (PROTONIX) injection 40 mg (has no administration in time range)  ceFEPIme (MAXIPIME) 2 g in sodium chloride 0.9 % 100 mL IVPB (has no administration in time range)  metroNIDAZOLE (FLAGYL) IVPB 500 mg (has no administration in time range)  fentaNYL (SUBLIMAZE) injection 50 mcg (50 mcg Intravenous Given 04/23/22 1751)  ondansetron (ZOFRAN) injection 4 mg (4 mg Intravenous Given 04/23/22 1751)  sodium chloride 0.9 % bolus 1,000 mL (1,000 mLs Intravenous New Bag/Given 04/23/22 1749)  iohexol (OMNIPAQUE) 300 MG/ML solution 100 mL (100 mLs Intravenous Contrast Given 04/23/22 1842)     IMPRESSION / MDM / ASSESSMENT AND PLAN / ED COURSE  I reviewed the triage vital  signs and the nursing notes.   Patient's presentation is most consistent with acute presentation with potential threat to life or bodily function.   Patient comes in with concern for worsening pain mostly near where her catheter is placed there is a little bit of redness as well as fever.  Differential is sepsis, intra-abdominal infection, perforation.  Will get CT, labs to further evaluate patient's white count is elevated this is concerning for sepsis.  Patient will be given some fluids she has had some low blood pressures here.  Her lactate was normal CT scan does not show any reaccumulation of the prior fluid collection and she has similar edema in the right pelvis but there now seems to be some concern for gastric antrum thickening with a few air bubbles.  Will cover her for possible microperforation with IV antibiotics, consult surgery, admit to the hospital team.  Will give her fluids 30 cc/kg  Strep test negative COVID test negative white count elevated creatinine elevated patient getting fluids. Patient given some IV fentanyl IV Zofran  Discussed the case with Dr. Hampton Abbot who wants a upper GI x-ray.  Called radiology to help facilitate this.  Discussed with Ruby who I left a message with.  She could contact the on-call radiologist to see if they will come in and do it.  Given I am leaving I have given the radiologist team Dr. Mont Dutton number to discuss the case with if they have further questions and to update them whether or not they will be able to do this tonight.     FINAL CLINICAL IMPRESSION(S) / ED DIAGNOSES   Final diagnoses:  Sepsis, due to unspecified organism, unspecified whether acute organ dysfunction present ([place])  Peptic ulcer     Rx / DC Orders   ED Discharge Orders     None        Note:  This document was prepared using Dragon voice recognition software and may include unintentional dictation errors.   Vanessa Coco, MD 04/23/22 1916    Vanessa [place],  MD 04/23/22 Joen Laura

## 2022-04-23 NOTE — Assessment & Plan Note (Signed)
Minimal gas in the anterior part of the gastric atrium.  At this time patient does not have any send symptoms in this area.  Surgeon has been engaged.  At this time we would say we are just trying to rule out microperforation.  Surgery has ordered oral contrast CAT scan.  We will follow that up keep the patient n.p.o. till then

## 2022-04-23 NOTE — ED Notes (Signed)
Pt gone to CT at this time.

## 2022-04-23 NOTE — Assessment & Plan Note (Signed)
As mentioned the source is not entirely clear, could be related to the drain site, I will request a wound care evaluation for management of the drain.  At this time continue with IV fluids, monitor blood pressure monitor telemetry and empiric antibiotics as above

## 2022-04-23 NOTE — Progress Notes (Signed)
04/23/22  Discussed patient with Dr. Jari Pigg and Dr. Elvia Collum.  Patient is s/p robotic assisted repair of perforated duodenal ulcer on 04/08/22 with Dr. Windell Moment.  She had a pelvic fluid collection that was drained by IR on 04/12/22.  Patient presented to hospital today due to fever at home and feeling weak/lightheaded.  In the ED, workup showed a WBC of 16.3, normal lactic acid of 0.9, and mildly elevated Cr to 1.16.  CT scan of abdomen/pelvis was done which showed that her pelvic fluid collection had resolved and some bubbles of free air around the prior perforation site.  Her main complaint is pain at the drain site itself, rather than upper abdominal pain.  Fluoro was not available overnight to do an UGI study, so a CT scan with oral contrast was done, which does not show any contrast extravasation.  I think the small amount of free air is most likely residual air from her surgery/perforation last month rather than a new or recurrent perforation.  As a precaution, would keep her NPO tonight, with IV fluid hydration, IV antibiotics, and IV PPI BID.  Will discuss with Dr. Windell Moment about the patient and the findings and he will see her in AM.  Olean Ree, MD

## 2022-04-24 ENCOUNTER — Other Ambulatory Visit: Payer: Self-pay

## 2022-04-24 ENCOUNTER — Encounter: Payer: Self-pay | Admitting: Internal Medicine

## 2022-04-24 DIAGNOSIS — K279 Peptic ulcer, site unspecified, unspecified as acute or chronic, without hemorrhage or perforation: Secondary | ICD-10-CM | POA: Diagnosis not present

## 2022-04-24 DIAGNOSIS — E876 Hypokalemia: Secondary | ICD-10-CM | POA: Diagnosis not present

## 2022-04-24 DIAGNOSIS — I10 Essential (primary) hypertension: Secondary | ICD-10-CM

## 2022-04-24 DIAGNOSIS — A419 Sepsis, unspecified organism: Secondary | ICD-10-CM | POA: Diagnosis not present

## 2022-04-24 LAB — CBC
HCT: 24.5 % — ABNORMAL LOW (ref 36.0–46.0)
Hemoglobin: 8.1 g/dL — ABNORMAL LOW (ref 12.0–15.0)
MCH: 32 pg (ref 26.0–34.0)
MCHC: 33.1 g/dL (ref 30.0–36.0)
MCV: 96.8 fL (ref 80.0–100.0)
Platelets: 421 10*3/uL — ABNORMAL HIGH (ref 150–400)
RBC: 2.53 MIL/uL — ABNORMAL LOW (ref 3.87–5.11)
RDW: 12.4 % (ref 11.5–15.5)
WBC: 10.4 10*3/uL (ref 4.0–10.5)
nRBC: 0 % (ref 0.0–0.2)

## 2022-04-24 LAB — HIV ANTIBODY (ROUTINE TESTING W REFLEX): HIV Screen 4th Generation wRfx: NONREACTIVE

## 2022-04-24 LAB — BASIC METABOLIC PANEL
Anion gap: 9 (ref 5–15)
BUN: 9 mg/dL (ref 6–20)
CO2: 26 mmol/L (ref 22–32)
Calcium: 8.2 mg/dL — ABNORMAL LOW (ref 8.9–10.3)
Chloride: 99 mmol/L (ref 98–111)
Creatinine, Ser: 1 mg/dL (ref 0.44–1.00)
GFR, Estimated: >60 mL/min (ref >60)
Glucose, Bld: 113 mg/dL — ABNORMAL HIGH (ref 70–99)
Potassium: 3.1 mmol/L — ABNORMAL LOW (ref 3.5–5.1)
Sodium: 134 mmol/L — ABNORMAL LOW (ref 135–145)

## 2022-04-24 MED ORDER — IBUPROFEN 400 MG PO TABS
400.0000 mg | ORAL_TABLET | Freq: Once | ORAL | Status: AC
  Administered 2022-04-24: 400 mg via ORAL
  Filled 2022-04-24: qty 1

## 2022-04-24 MED ORDER — SODIUM CHLORIDE 0.9 % IV BOLUS
250.0000 mL | Freq: Once | INTRAVENOUS | Status: AC
  Administered 2022-04-24: 250 mL via INTRAVENOUS

## 2022-04-24 MED ORDER — SUCRALFATE 1 GM/10ML PO SUSP
1.0000 g | Freq: Three times a day (TID) | ORAL | Status: DC
  Administered 2022-04-24 – 2022-04-25 (×3): 1 g via ORAL
  Filled 2022-04-24 (×3): qty 10

## 2022-04-24 NOTE — TOC Initial Note (Signed)
Transition of Care [facility]) - Initial/Assessment Note    Patient Details  Name: Allison Alvarez MRN: [id number] Date of Birth: 1961-01-03  Transition of Care [facility]) CM/SW Contact:    Laurena Slimmer, RN Phone Number: 04/24/2022, 10:05 AM  Clinical Narrative:                  Transition of Care [facility]) Screening Note   Patient Details  Name: Allison Alvarez Date of Birth: 12-Dec-1960   Transition of Care [facility]) CM/SW Contact:    Laurena Slimmer, RN Phone Number: 04/24/2022, 10:05 AM    Transition of Care Department (TOC) has reviewed patient and no TOC needs have been identified at this time. We will continue to monitor patient advancement through interdisciplinary progression rounds. If new patient transition needs arise, please place a TOC consult. '        Patient Goals and CMS Choice            Expected Discharge Plan and Services                                              Prior Living Arrangements/Services                       Activities of Daily Living Home Assistive Devices/Equipment: None ADL Screening (condition at time of admission) Patient's cognitive ability adequate to safely complete daily activities?: Yes Is the patient deaf or have difficulty hearing?: No Does the patient have difficulty seeing, even when wearing glasses/contacts?: No Does the patient have difficulty concentrating, remembering, or making decisions?: No Patient able to express need for assistance with ADLs?: Yes Does the patient have difficulty dressing or bathing?: No Independently performs ADLs?: Yes (appropriate for developmental age) Does the patient have difficulty walking or climbing stairs?: No Weakness of Legs: None Weakness of Arms/Hands: None  Permission Sought/Granted                  Emotional Assessment              Admission diagnosis:  Peptic ulcer [K27.9] Fever [R50.9] Sepsis, due to unspecified organism, unspecified whether acute organ  dysfunction present [facility]) [A41.9] Patient Active Problem List   Diagnosis Date Noted   Fever 04/23/2022   Sepsis ([place]) 04/23/2022   Peptic ulcer 04/23/2022   Hyponatremia 04/23/2022   Abnormal electrocardiogram (ECG) (EKG) 04/21/2022   Pelvic abscess in female 04/21/2022   Perforation of intestine ([place]) 04/08/2022   Gastric perforation, acute 04/08/2022   Lower extremity edema 11/12/2021   Concussion 10/13/2021   Right lower quadrant abdominal pain 09/17/2021   Esophageal dysphagia 09/17/2021   Cognitive complaints 09/17/2021   Hypertension, essential 02/24/2018   Diverticulitis of colon 06/16/2017   Diverticulosis of intestine without bleeding 05/07/2017   Other chronic pain 05/07/2017   Mastodynia, female 02/22/2015   GAD (generalized anxiety disorder) 10/31/2014   Major depressive disorder with single episode 10/31/2014   Neurodermatitis 10/31/2014   Tobacco use disorder 10/31/2014   Urgency incontinence 02/28/2013   Incontinence without sensory awareness 08/22/2009   Attention deficit disorder 08/16/2008   PCP:  Crecencio Mc, MD Pharmacy:   [id number] [address], [place] [address] Phone: [phone] Fax: [phone]     Social Determinants of Health (SDOH) Social History:  SDOH Screenings   Food Insecurity: No Food Insecurity (04/24/2022)  Housing: Low Risk  (04/23/2022)  Transportation Needs: No Transportation Needs (04/24/2022)  Utilities: Not At Risk (04/24/2022)  Depression (PHQ2-9): Medium Risk (04/21/2022)  Tobacco Use: Medium Risk (04/24/2022)   SDOH Interventions:     Readmission Risk Interventions     No data to display

## 2022-04-24 NOTE — Progress Notes (Signed)
[facility] NAME: Allison Alvarez    MR#:  [id number]  DATE OF BIRTH:  Oct 23, 1960  SUBJECTIVE:   Seen earlier. Husband at bedside. Denies any abdominal pain. Tolerating PO diet. Discomfort over the sacral drain   VITALS:  Blood pressure 94/67, pulse 76, temperature 98 F (36.7 C), temperature source Oral, resp. rate 18, height 5\' 8"  (1.727 m), weight 75.3 kg, SpO2 96 %.  PHYSICAL EXAMINATION:   GENERAL:  62 y.o.-year-old patient lying in the bed with no acute distress.  LUNGS: Normal breath sounds bilaterally, no wheezing CARDIOVASCULAR: S1, S2 normal. No murmur   ABDOMEN: Soft, nontender, nondistended. Bowel sounds present.  EXTREMITIES: No  edema b/l.    NEUROLOGIC: nonfocal  patient is alert and awake SKIN: No obvious rash, lesion, or ulcer.   LABORATORY PANEL:  CBC Recent Labs  Lab 04/24/22 0414  WBC 10.4  HGB 8.1*  HCT 24.5*  PLT 421*    Chemistries  Recent Labs  Lab 04/21/22 1124 04/23/22 1452 04/24/22 0414  NA 138 130* 134*  K 4.5 3.8 3.1*  CL 99 94* 99  CO2 31 26 26   GLUCOSE 96 117* 113*  BUN 12 11 9   CREATININE 0.98 1.16* 1.00  CALCIUM 9.1 9.1 8.2*  MG 2.1  --   --   AST 15 22  --   ALT 20 18  --   ALKPHOS 93 101  --   BILITOT 0.3 1.2  --    Cardiac Enzymes No results for input(s): "TROPONINI" in the last 168 hours. RADIOLOGY:  CT ABDOMEN WO CONTRAST  Result Date: 04/23/2022 CLINICAL DATA:  History of duodenal perforation repair, fever EXAM: CT ABDOMEN WITHOUT CONTRAST TECHNIQUE: Multidetector CT imaging of the abdomen was performed following the standard protocol without IV contrast. RADIATION DOSE REDUCTION: This exam was performed according to the departmental dose-optimization program which includes automated exposure control, adjustment of the mA and/or kV according to patient size and/or use of iterative reconstruction technique. COMPARISON:  CT 04/11/2022, 04/23/2022, 04/08/2022 FINDINGS:  Lower chest: Lung bases demonstrate mild subpleural reticulation. No acute airspace disease. Hepatobiliary: No focal liver abnormality is seen. Status post cholecystectomy. No biliary dilatation. Pancreas: Unremarkable. No pancreatic ductal dilatation or surrounding inflammatory changes. Spleen: Normal in size without focal abnormality. Adrenals/Urinary Tract: Adrenal glands are normal. Excreted contrast in the renal collecting systems. Stomach/Bowel: Enteral contrast has been administered and passes into the proximal small bowel. Wall thickening of the pylorus and duodenal bulb with residual soft tissue stranding and inflammation. Several small gas locules anterior to the proximal duodenum, when compared with 04/11/2022, this is decreased. Vascular/Lymphatic: Mild aortic atherosclerosis. No aneurysm. No suspicious lymph nodes Other: Negative for significant ascites. Musculoskeletal: No acute osseous abnormality IMPRESSION: 1. Interval administration of enteral contrast without definitive evidence for extraluminal contrast/leak. Wall thickening and mild inflammatory change at the pylorus and duodenal bulb, likely resolving postoperative change. Several small extraluminal gas locules anterior to the proximal duodenum in the region of previously noted perforation; similar distribution of gas locules as compared with 04/11/2022 and overall decreased compared with that exam. Findings could represent slowly resolving postoperative gas, with alternative consideration of contained leak though no contrast extravasates or collects in this region. Consider short interval CT follow-up to ensure no increasing extraluminal gas in the region. Electronically Signed   By: Donavan Foil M.D.   On: 04/23/2022 20:48   DG Chest Port 1 View  Result Date: 04/23/2022 CLINICAL DATA:  Fever EXAM: PORTABLE CHEST 1 VIEW COMPARISON:  02/23/2018 FINDINGS: Heart size upper limits of normal. Left basilar atelectasis. Mild bronchial wall  thickening and coarsening of the interstitium favored chronic. No pleural effusion or pneumothorax. No displaced rib fractures. IMPRESSION: Chronic bronchitic changes.  No acute abnormality Electronically Signed   By: Minerva Fester M.D.   On: 04/23/2022 20:43   CT ABDOMEN PELVIS W CONTRAST  Result Date: 04/23/2022 CLINICAL DATA:  Acute abdominal pain, recent surgery 2 weeks ago. EXAM: CT ABDOMEN AND PELVIS WITH CONTRAST TECHNIQUE: Multidetector CT imaging of the abdomen and pelvis was performed using the standard protocol following bolus administration of intravenous contrast. RADIATION DOSE REDUCTION: This exam was performed according to the departmental dose-optimization program which includes automated exposure control, adjustment of the mA and/or kV according to patient size and/or use of iterative reconstruction technique. CONTRAST:  OMNIPAQUE IOHEXOL 300 MG/ML  SOLN COMPARISON:  CT abdomen and pelvis 04/11/2022. FINDINGS: Lower chest: There is minimal atelectasis in the left lung base. Hepatobiliary: No focal liver abnormality is seen. Status post cholecystectomy. No biliary dilatation. Pancreas: Unremarkable. No pancreatic ductal dilatation or surrounding inflammatory changes. Spleen: Normal in size without focal abnormality. Adrenals/Urinary Tract: Adrenal glands are unremarkable. Kidneys are normal, without renal calculi, focal lesion, or hydronephrosis. Bladder is unremarkable. Stomach/Bowel: There is some wall thickening of the anterior wall of the gastric antrum with a small amount of inflammatory stranding few small bubbles of free air. Free air has decreased when compared to prior examination in this region. The stomach is otherwise within normal limits. Small hiatal hernia is present. No evidence for bowel obstruction or pneumatosis. The appendix is surgically absent. Vascular/Lymphatic: Aortic atherosclerosis. No enlarged abdominal or pelvic lymph nodes. Reproductive: Status post  hysterectomy. No adnexal masses. Other: Right posterior approach percutaneous drainage catheter is seen coiled in the pelvis. Previously identified pelvic fluid collection has resolved in the interval. Presacral edema persists as well as mild edema in the right pelvis. There is no evidence for focal hematoma, new fluid collection or free fluid. There is no focal abdominal wall hernia. Musculoskeletal: No acute or significant osseous findings. IMPRESSION: 1. Interval resolution of pelvic fluid collection with percutaneous drainage catheter in place. 2. Persistent presacral edema and mild edema in the right pelvis. 3. Wall thickening of the anterior wall of the gastric antrum with surrounding inflammatory stranding and few small bubbles of free air. Findings are concerning for gastritis/peptic ulcer disease. Aortic Atherosclerosis (ICD10-I70.0). Electronically Signed   By: Darliss Cheney M.D.   On: 04/23/2022 19:03    Assessment and Plan Allison Alvarez is a 62 y.o. female history of anxiety, arthritis, depression, hypertension and hyperlipidemia who is status post a laparoscopic duodenal ulcer repair after having a duodenal ulcer perforation she did develop intra-abdominal abscess which was drained percutaneously.  She completed antibiotic therapy he was discharged on 12/31.  She comes back in today for fever and weakness.   Febrile illness so far etiology unclear. -- Came in with white count of 16,000-- down to 10 K -- patient's pelvic drain has been removed by surgery Dr. Hazle Quant -- blood culture negative -- okay to hold antibiotic and monitor discussed with general surgery -- resume diet -- sepsis resolved  History of duodenal ulcer perforation status post surgery with intra-abdominal abscess GERD -- recently complete did antibiotic in December 2023 -- continue PPI BID and Carafate   History of hypertension -- hold BP meds. Blood pressure soft.  Hypokalemia -- replace K  Procedures:  none Family communication : husband  Consults : general surgery CODE STATUS: full DVT Prophylaxis :scd Level of care: Telemetry Medical Status is: Inpatient Remains inpatient appropriate because: monitor for fever and blood cultures    TOTAL TIME TAKING CARE OF THIS PATIENT: 35 minutes.  >50% time spent on counselling and coordination of care  Note: This dictation was prepared with Dragon dictation along with smaller phrase technology. Any transcriptional errors that result from this process are unintentional.  Fritzi Mandes M.D    [facility]   CC: Primary care physician; Crecencio Mc, MD
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
## 2022-04-24 NOTE — Progress Notes (Signed)
Patient ID: Allison Alvarez, female   DOB: 1960/11/20, 62 y.o.   MRN: [id number]     [place] [facility] Day(s): 1.   Interval History: Patient seen and examined, no acute events or new complaints overnight. Patient reports feeling better this morning.  She has not felt any fever today.  Denies any upper abdominal pain.  She does complain of right buttock pain due to the drain.  CT scan yesterday shows resolution of the pelvic abscess.  No other sign of infection.  Vital signs in last 24 hours: [min-max] current  Temp:  [97.4 F (36.3 C)-99.5 F (37.5 C)] 98 F (36.7 C) (01/12 0735) Pulse Rate:  [76-100] 76 (01/12 0735) Resp:  [16-20] 18 (01/12 0735) BP: (93-122)/(53-74) 94/67 (01/12 0735) SpO2:  [95 %-98 %] 96 % (01/12 0735) Weight:  [74.8 kg-75.3 kg] 75.3 kg (01/11 2307)     Height: 5\' 8"  (172.7 cm) Weight: 75.3 kg BMI (Calculated): 25.25   Physical Exam:  Constitutional: alert, cooperative and no distress  Respiratory: breathing non-labored at rest  Cardiovascular: regular rate and sinus rhythm  Gastrointestinal: soft, non-tender, and non-distended  Labs:     Latest Ref Rng & Units 04/24/2022    4:14 AM 04/23/2022    2:52 PM 04/21/2022   11:24 AM  CBC  WBC 4.0 - 10.5 K/uL 10.4  16.3  9.7   Hemoglobin 12.0 - 15.0 g/dL 8.1  10.7  10.6   Hematocrit 36.0 - 46.0 % 24.5  32.9  31.3   Platelets 150 - 400 K/uL 421  669  683.0       Latest Ref Rng & Units 04/24/2022    4:14 AM 04/23/2022    2:52 PM 04/21/2022   11:24 AM  CMP  Glucose 70 - 99 mg/dL 113  117  96   BUN 6 - 20 mg/dL 9  11  12    Creatinine 0.44 - 1.00 mg/dL 1.00  1.16  0.98   Sodium 135 - 145 mmol/L 134  130  138   Potassium 3.5 - 5.1 mmol/L 3.1  3.8  4.5   Chloride 98 - 111 mmol/L 99  94  99   CO2 22 - 32 mmol/L 26  26  31    Calcium 8.9 - 10.3 mg/dL 8.2  9.1  9.1   Total Protein 6.5 - 8.1 g/dL  7.8  6.7   Total Bilirubin 0.3 - 1.2 mg/dL  1.2  0.3   Alkaline Phos 38 - 126 U/L  101  93   AST 15 - 41 U/L   22  15   ALT 0 - 44 U/L  18  20     Imaging studies: I personally evaluated the images of the CT scan done yesterday.  Pelvic acid resolved.  Improved gas surrounding the gastric perforation repair.  Still with thickening of the gastric and duodenal mucosa.  No contrast extravasation.   Assessment/Plan:  62 y.o. female admitted with fever.  Currently infection workup has been negative.  Intra-abdominal he no other sign of infection.  Pelvic abscess resolved.  I remove drain today.  I started patient on liquid diet.  Will consider advancing as tolerated if no abdominal pain with eating.  I will start Carafate due to persistent inflammatory changes of the gastric and duodenal wall.  Continue high-dose PPIs.  I will continue to follow closely.  Arnold Long, MD

## 2022-04-24 NOTE — Progress Notes (Signed)
Patient said she only takes adderall when she is working so she has not been taking it. Order received from Dr Posey Pronto to discontinue adderall and telemetry. Patient is running Sinus rhythm

## 2022-04-24 NOTE — Progress Notes (Signed)
Patient has a headache. Possibly caffeine headache. Order received from MD for a once of ibuprofen

## 2022-04-24 NOTE — Consult Note (Signed)
WOC Nurse Consult Note: Reason for Consult:consulted for pain at right sacral drain site.  Secure chat with Danae Chen, bedside RN who confirms that surgery is following for this, there is no additional wound needing topical recommendations at this time.  No further WOC intervention needed.  Will not follow at this time.  Please re-consult if needed.  Estrellita Ludwig MSN, RN, FNP-BC CWON Wound, Ostomy, Continence Nurse [facility] [phone] Pager [phone]

## 2022-04-24 NOTE — Progress Notes (Signed)
BP low. Ordered received from MD for a NS 261ml bolus

## 2022-04-25 DIAGNOSIS — R509 Fever, unspecified: Secondary | ICD-10-CM

## 2022-04-25 DIAGNOSIS — A419 Sepsis, unspecified organism: Secondary | ICD-10-CM | POA: Diagnosis not present

## 2022-04-25 MED ORDER — SUCRALFATE 1 GM/10ML PO SUSP: 1.0000 g | Freq: Three times a day (TID) | ORAL | 0 refills | Status: DC

## 2022-04-25 NOTE — Progress Notes (Signed)
Discharge instructions and med details reviewed with patient. All questions answered. Pt verbalizes understanding. AVS given to pt. IV removed Patient escorted out via wheelchair  [place], RN

## 2022-04-25 NOTE — Discharge Instructions (Addendum)
Pt advised to hold BP meds for  now. Resume 1 medication at a time once BP >751 systolic.  Advised to call PCP if any questions about her BP meds

## 2022-04-25 NOTE — Discharge Summary (Signed)
Physician Discharge Summary   Patient: Allison Alvarez MRN: [id number] DOB: [birth year]  Admit date:     04/23/2022  Discharge date: 04/25/22  Discharge Physician: Enedina Finner   PCP: Sherlene Shams, MD   Recommendations at discharge:    Pt advised to hold BP meds for  now. Resume 1 medication at a time once BP >130 systolic.  Advised to call PCP if any questions about her BP meds. 2. F/u Dr Hazle Quant in 1 week  Discharge Diagnoses: Principal Problem:   Fever Active Problems:   Hypertension, essential   Sepsis (HCC)   Peptic ulcer   Hyponatremia   Hypokalemia   Assessment and Plan Berneta Sconyers is a 62 y.o. female history of anxiety, arthritis, depression, hypertension and hyperlipidemia who is status post a laparoscopic duodenal ulcer repair after having a duodenal ulcer perforation she did develop intra-abdominal abscess which was drained percutaneously.  She completed antibiotic therapy he was discharged on 12/31.  She comes back in today for fever and weakness.    Febrile illness so far etiology unclear. -- Came in with white count of 16,000-- down to 10 K -- patient's pelvic drain has been removed by surgery Dr. Hazle Quant -- blood culture negative -- okay to hold antibiotic and monitor discussed with general surgery -- resume diet -- sepsis resolved -- no fever. Pt requesting to go home   History of duodenal ulcer perforation status post surgery with intra-abdominal abscess GERD -- recently complete did antibiotic in December 2023 -- continue PPI BID and Carafate   History of hypertension -- hold BP meds. Blood pressure soft. --advised to keep log of BP at home and resume once BP remains >130   Hypokalemia -- replace K   Ok from surgery standpoint for discharge     Procedures: none Family communication : husband  Consults : general surgery CODE STATUS: full DVT Prophylaxis :scd     Pain control - [place] Controlled Substance Reporting System  database was reviewed. and patient was instructed, not to drive, operate heavy machinery, perform activities at heights, swimming or participation in water activities or provide baby-sitting services while on Pain, Sleep and Anxiety Medications; until their outpatient Physician has advised to do so again. Also recommended to not to take more than prescribed Pain, Sleep and Anxiety Medications.  Disposition: Home Diet recommendation:  Discharge Diet Orders (From admission, onward)     Start     Ordered   04/25/22 0000  Diet - low sodium heart healthy        04/25/22 0928           Cardiac diet DISCHARGE MEDICATION: Allergies as of 04/25/2022       Reactions   Morphine Itching   Hydromorphone Itching        Medication List     STOP taking these medications    amLODipine 2.5 MG tablet Commonly known as: NORVASC   hydrochlorothiazide 25 MG tablet Commonly known as: HYDRODIURIL   ICY HOT EX   losartan 100 MG tablet Commonly known as: COZAAR   metoprolol tartrate 25 MG tablet Commonly known as: LOPRESSOR       TAKE these medications    acetaminophen 325 MG tablet Commonly known as: TYLENOL Take 650 mg by mouth every 6 (six) hours as needed for moderate pain.   amphetamine-dextroamphetamine 25 MG 24 hr capsule Commonly known as: Adderall XR Take 1 capsule by mouth daily.   busPIRone 15 MG tablet Commonly known as: BUSPAR Take 1  tablet (15 mg total) by mouth 2 (two) times daily.   citalopram 40 MG tablet Commonly known as: CELEXA Take 40 mg by mouth in the morning.   cyanocobalamin 1000 MCG/ML injection Commonly known as: VITAMIN B12 Inject 1 mL (1,000 mcg total) into the muscle every 30 (thirty) days.   diazepam 5 MG tablet Commonly known as: VALIUM TAKE ONE TABLET BY MOUTH EVERY 12 HOURS AS NEEDED FOR ANXIETY   gabapentin 300 MG capsule Commonly known as: NEURONTIN Take 300 mg by mouth every evening.   pantoprazole 40 MG tablet Commonly known as:  Protonix Take 1 tablet (40 mg total) by mouth 2 (two) times daily.   sucralfate 1 GM/10ML suspension Commonly known as: CARAFATE Take 10 mLs (1 g total) by mouth 4 (four) times daily -  with meals and at bedtime.   traMADol 50 MG tablet Commonly known as: ULTRAM Take 1 tablet (50 mg total) by mouth every 6 (six) hours as needed for up to 5 days.   traZODone 150 MG tablet Commonly known as: DESYREL Take 0.5 tablets (75 mg total) by mouth at bedtime.        Follow-up Information     Crecencio Mc, MD. Schedule an appointment as soon as possible for a visit in 1 week(s).   Specialty: Internal Medicine Contact information: [address] [place] [address] [phone]         Herbert Pun, MD. Schedule an appointment as soon as possible for a visit in 1 week(s).   Specialty: General Surgery Why: hospital f/u Contact information: [place] [address] [phone]                 [facility] Weights   04/23/22 1451 04/23/22 2307  Weight: 74.8 kg 75.3 kg     Condition at discharge: fair  The results of significant diagnostics from this hospitalization (including imaging, microbiology, ancillary and laboratory) are listed below for reference.   Imaging Studies: CT ABDOMEN WO CONTRAST  Result Date: 04/23/2022 CLINICAL DATA:  History of duodenal perforation repair, fever EXAM: CT ABDOMEN WITHOUT CONTRAST TECHNIQUE: Multidetector CT imaging of the abdomen was performed following the standard protocol without IV contrast. RADIATION DOSE REDUCTION: This exam was performed according to the departmental dose-optimization program which includes automated exposure control, adjustment of the mA and/or kV according to patient size and/or use of iterative reconstruction technique. COMPARISON:  CT 04/11/2022, 04/23/2022, 04/08/2022 FINDINGS: Lower chest: Lung bases demonstrate mild subpleural reticulation. No acute airspace disease.  Hepatobiliary: No focal liver abnormality is seen. Status post cholecystectomy. No biliary dilatation. Pancreas: Unremarkable. No pancreatic ductal dilatation or surrounding inflammatory changes. Spleen: Normal in size without focal abnormality. Adrenals/Urinary Tract: Adrenal glands are normal. Excreted contrast in the renal collecting systems. Stomach/Bowel: Enteral contrast has been administered and passes into the proximal small bowel. Wall thickening of the pylorus and duodenal bulb with residual soft tissue stranding and inflammation. Several small gas locules anterior to the proximal duodenum, when compared with 04/11/2022, this is decreased. Vascular/Lymphatic: Mild aortic atherosclerosis. No aneurysm. No suspicious lymph nodes Other: Negative for significant ascites. Musculoskeletal: No acute osseous abnormality IMPRESSION: 1. Interval administration of enteral contrast without definitive evidence for extraluminal contrast/leak. Wall thickening and mild inflammatory change at the pylorus and duodenal bulb, likely resolving postoperative change. Several small extraluminal gas locules anterior to the proximal duodenum in the region of previously noted perforation; similar distribution of gas locules as compared with 04/11/2022 and overall decreased compared  with that exam. Findings could represent slowly resolving postoperative gas, with alternative consideration of contained leak though no contrast extravasates or collects in this region. Consider short interval CT follow-up to ensure no increasing extraluminal gas in the region. Electronically Signed   By: Jasmine Pang M.D.   On: 04/23/2022 20:48   DG Chest Port 1 View  Result Date: 04/23/2022 CLINICAL DATA:  Fever EXAM: PORTABLE CHEST 1 VIEW COMPARISON:  02/23/2018 FINDINGS: Heart size upper limits of normal. Left basilar atelectasis. Mild bronchial wall thickening and coarsening of the interstitium favored chronic. No pleural effusion or  pneumothorax. No displaced rib fractures. IMPRESSION: Chronic bronchitic changes.  No acute abnormality Electronically Signed   By: Minerva Fester M.D.   On: 04/23/2022 20:43   CT ABDOMEN PELVIS W CONTRAST  Result Date: 04/23/2022 CLINICAL DATA:  Acute abdominal pain, recent surgery 2 weeks ago. EXAM: CT ABDOMEN AND PELVIS WITH CONTRAST TECHNIQUE: Multidetector CT imaging of the abdomen and pelvis was performed using the standard protocol following bolus administration of intravenous contrast. RADIATION DOSE REDUCTION: This exam was performed according to the departmental dose-optimization program which includes automated exposure control, adjustment of the mA and/or kV according to patient size and/or use of iterative reconstruction technique. CONTRAST:  OMNIPAQUE IOHEXOL 300 MG/ML  SOLN COMPARISON:  CT abdomen and pelvis 04/11/2022. FINDINGS: Lower chest: There is minimal atelectasis in the left lung base. Hepatobiliary: No focal liver abnormality is seen. Status post cholecystectomy. No biliary dilatation. Pancreas: Unremarkable. No pancreatic ductal dilatation or surrounding inflammatory changes. Spleen: Normal in size without focal abnormality. Adrenals/Urinary Tract: Adrenal glands are unremarkable. Kidneys are normal, without renal calculi, focal lesion, or hydronephrosis. Bladder is unremarkable. Stomach/Bowel: There is some wall thickening of the anterior wall of the gastric antrum with a small amount of inflammatory stranding few small bubbles of free air. Free air has decreased when compared to prior examination in this region. The stomach is otherwise within normal limits. Small hiatal hernia is present. No evidence for bowel obstruction or pneumatosis. The appendix is surgically absent. Vascular/Lymphatic: Aortic atherosclerosis. No enlarged abdominal or pelvic lymph nodes. Reproductive: Status post hysterectomy. No adnexal masses. Other: Right posterior approach percutaneous drainage  catheter is seen coiled in the pelvis. Previously identified pelvic fluid collection has resolved in the interval. Presacral edema persists as well as mild edema in the right pelvis. There is no evidence for focal hematoma, new fluid collection or free fluid. There is no focal abdominal wall hernia. Musculoskeletal: No acute or significant osseous findings. IMPRESSION: 1. Interval resolution of pelvic fluid collection with percutaneous drainage catheter in place. 2. Persistent presacral edema and mild edema in the right pelvis. 3. Wall thickening of the anterior wall of the gastric antrum with surrounding inflammatory stranding and few small bubbles of free air. Findings are concerning for gastritis/peptic ulcer disease. Aortic Atherosclerosis (ICD10-I70.0). Electronically Signed   By: Darliss Cheney M.D.   On: 04/23/2022 19:03   CT GUIDED PERITONEAL/RETROPERITONEAL FLUID DRAIN BY PERC CATH  Result Date: 04/12/2022 INDICATION: Postop pelvic abscess EXAM: CT DRAINAGE PELVIC ABSCESS MEDICATIONS: The patient is currently admitted to the hospital and receiving intravenous antibiotics. The antibiotics were administered within an appropriate time frame prior to the initiation of the procedure. ANESTHESIA/SEDATION: Moderate (conscious) sedation was employed during this procedure. A total of Versed 1.0 mg and Fentanyl 50 mcg was administered intravenously by the radiology nurse. Total intra-service moderate Sedation Time: 10 minutes. The patient's level of consciousness and vital signs were monitored continuously  by radiology nursing throughout the procedure under my direct supervision. COMPLICATIONS: None immediate. PROCEDURE: Informed written consent was obtained from the patient after a thorough discussion of the procedural risks, benefits and alternatives. All questions were addressed. Maximal Sterile Barrier Technique was utilized including caps, mask, sterile gowns, sterile gloves, sterile drape, hand hygiene and  skin antiseptic. A timeout was performed prior to the initiation of the procedure. previous imaging reviewed. patient positioned prone. preliminary noncontrast CT performed. the dependent pelvic abscess was localized and marked for a right trans gluteal approach. under sterile conditions and local anesthesia, an 18 gauge 10 cm access was advanced from a right trans gluteal approach adjacent to the sacrum. needle position confirmed with CT. syringe aspiration yielded serosanguineous fluid. sample sent for culture. guidewire inserted followed by tract dilatation to insert a 10 french drain. drain catheter position confirmed with CT. catheter secured with a silk suture and connected to external suction bulb. sterile dressing applied. no immediate complication. patient tolerated the procedure well. IMPRESSION: Successful CT-guided right trans gluteal pelvic abscess drain placement Electronically Signed   By: Judie Petit.  Shick M.D.   On: 04/12/2022 16:14   CT ABDOMEN PELVIS W CONTRAST  Result Date: 04/11/2022 CLINICAL DATA:  Abdominal pain, post-op pain, post op EXAM: CT ABDOMEN AND PELVIS WITH CONTRAST TECHNIQUE: Multidetector CT imaging of the abdomen and pelvis was performed using the standard protocol following bolus administration of intravenous contrast. RADIATION DOSE REDUCTION: This exam was performed according to the departmental dose-optimization program which includes automated exposure control, adjustment of the mA and/or kV according to patient size and/or use of iterative reconstruction technique. CONTRAST:  OMNIPAQUE IOHEXOL 300 MG/ML  SOLN COMPARISON:  CT 04/08/2022 FINDINGS: Lower chest: New small bilateral pleural effusions with associated bibasilar atelectasis/consolidation. Heart size is normal. Hepatobiliary: No focal liver abnormality is seen. Status post cholecystectomy. No biliary dilatation. Pancreas: Unremarkable. No pancreatic ductal dilatation or surrounding inflammatory changes. Spleen:  Normal in size without focal abnormality. Adrenals/Urinary Tract: Unremarkable adrenal glands. Kidneys enhance symmetrically without focal lesion, stone, or hydronephrosis. Ureters are nondilated. Small amount of air in the urinary bladder likely related to recent instrumentation. Urinary bladder appears otherwise unremarkable for the degree of distention. Urethral diverticula again noted, unchanged in appearance. Stomach/Bowel: Thickened but improving appearance of the gastric antrum and proximal duodenum. No abnormally dilated loops of bowel. There is enteric contrast within the colon. No focal colonic wall thickening or inflammatory changes. Vascular/Lymphatic: Scattered aortoiliac atherosclerotic calcifications without aneurysm. There are a few mildly prominent upper abdominal lymph nodes, unchanged. No abdominopelvic lymphadenopathy by size criteria. Reproductive: Status post hysterectomy. No adnexal masses. Other: Fluid within the posterior cul-de-sac of the pelvis is now rim enhancing with collection measuring approximately 9.7 x 7.3 x 9.4 cm (series 2, image 76). Trace perihepatic ascites. Surgical drain is position within the right upper abdomen. There are a few locules of extraluminal air adjacent to the proximal duodenum. The amount of free intraperitoneal air has decreased from the previous exam. Musculoskeletal: Small amount of soft tissue air along the abdominal wall related to recent surgery. No new or acute bony findings. IMPRESSION: 1. Fluid within the posterior cul-de-sac of the pelvis is now rim-enhancing with collection measuring approximately 9.7 x 7.3 x 9.4 cm, suggesting abscess. 2. Thickened but improving appearance of the gastric antrum and proximal duodenum. 3. New small bilateral pleural effusions with associated bibasilar atelectasis/consolidation. 4. Aortic atherosclerosis (ICD10-I70.0). Electronically Signed   By: Duanne Guess D.O.   On: 04/11/2022 13:21  DG UGI W SINGLE CM (SOL  OR THIN BA)  Result Date: 04/09/2022 CLINICAL DATA:  Duodenal perforation repair. EXAM: WATER SOLUBLE UPPER GI SERIES TECHNIQUE: Single-column upper GI series was performed using water soluble contrast. Radiation Exposure Index (as provided by the fluoroscopic device): 11.1 mGy CONTRAST:  50 mL Omnipaque 300 COMPARISON:  None Available. FLUOROSCOPY: Fluoroscopy Time:  1 minute 6 seconds Radiation Exposure Index (if provided by the fluoroscopic device): 11.1 mGy Number of Acquired Spot Images: 0 FINDINGS: UPPER GI SERIES: Examination of the esophagus demonstrated normal esophageal motility. Normal esophageal morphology without evidence of esophagitis or ulceration. No esophageal stricture, diverticula, or mass lesion. No evidence of hiatal hernia. No spontaneous or inducible gastroesophageal reflux. Examination of the stomach demonstrated normal rugal folds and areae gastricae. Gastric mucosa appeared grossly normal without evidence of ulceration, scarring, or mass lesion. Gastric motility and emptying was normal. Fluoroscopic examination of the duodenum demonstrates no extraluminal contrast to suggest perforation. IMPRESSION: 1. No extraluminal contrast to suggest duodenal perforation or leak. Electronically Signed   By: Elige KoHetal  Hezzie Karim M.D.   On: 04/09/2022 13:25   CT ABDOMEN PELVIS W CONTRAST  Result Date: 04/08/2022 CLINICAL DATA:  Acute generalized abdominal pain. EXAM: CT ABDOMEN AND PELVIS WITH CONTRAST TECHNIQUE: Multidetector CT imaging of the abdomen and pelvis was performed using the standard protocol following bolus administration of intravenous contrast. RADIATION DOSE REDUCTION: This exam was performed according to the departmental dose-optimization program which includes automated exposure control, adjustment of the mA and/or kV according to patient size and/or use of iterative reconstruction technique. CONTRAST:  100mL OMNIPAQUE IOHEXOL 300 MG/ML  SOLN COMPARISON:  September 25, 2021. FINDINGS: Lower  chest: No acute abnormality. Hepatobiliary: No focal liver abnormality is seen. Status post cholecystectomy. No biliary dilatation. Pancreas: Unremarkable. No pancreatic ductal dilatation or surrounding inflammatory changes. Spleen: Normal in size without focal abnormality. Adrenals/Urinary Tract: Adrenal glands are unremarkable. Kidneys are normal, without renal calculi, focal lesion, or hydronephrosis. Bladder is unremarkable. Stomach/Bowel: status post appendectomy. Postsurgical changes are seen involving sigmoid colon. There is no evidence of bowel obstruction. Free air is noted in the epigastric region concerning for rupture of hollow viscus. There appears to be wall thickening of the distal stomach and proximal duodenum with possible ulceration in the duodenum which may be source of pneumoperitoneum. Vascular/Lymphatic: Aortic atherosclerosis. No enlarged abdominal or pelvic lymph nodes. Reproductive: Status post hysterectomy. No adnexal masses. Other: Mild amount of free fluid or ascites is noted in the pelvis and around the liver and spleen. No hernia is noted. Musculoskeletal: No acute or significant osseous findings. IMPRESSION: Pneumoperitoneum is noted, most likely due to perforated ulcer involving proximal duodenum. Mild amount of free fluid or ascites is noted in the pelvis and around the liver and spleen. Critical Value/emergent results were called by telephone at the time of interpretation on 04/08/2022 at 1:32 pm to provider [facility] , who verbally acknowledged these results. Electronically Signed   By: Lupita RaiderJames  Green Jr M.D.   On: 04/08/2022 13:33    Microbiology: Results for orders placed or performed during the hospital encounter of 04/23/22  Resp panel by RT-PCR (RSV, Flu A&B, Covid) Anterior Nasal Swab     Status: None   Collection Time: 04/23/22  5:19 PM   Specimen: Anterior Nasal Swab  Result Value Ref Range Status   SARS Coronavirus 2 by RT PCR NEGATIVE NEGATIVE Final     Comment: (NOTE) SARS-CoV-2 target nucleic acids are NOT DETECTED.  The SARS-CoV-2 RNA is generally  detectable in upper respiratory specimens during the acute phase of infection. The lowest concentration of SARS-CoV-2 viral copies this assay can detect is 138 copies/mL. A negative result does not preclude SARS-Cov-2 infection and should not be used as the sole basis for treatment or other patient management decisions. A negative result may occur with  improper specimen collection/handling, submission of specimen other than nasopharyngeal swab, presence of viral mutation(s) within the areas targeted by this assay, and inadequate number of viral copies(<138 copies/mL). A negative result must be combined with clinical observations, patient history, and epidemiological information. The expected result is Negative.  Fact Sheet for Patients:  EntrepreneurPulse.com.au  Fact Sheet for Healthcare Providers:  IncredibleEmployment.be  This test is no t yet approved or cleared by the [place] FDA and  has been authorized for detection and/or diagnosis of SARS-CoV-2 by FDA under an Emergency Use Authorization (EUA). This EUA will remain  in effect (meaning this test can be used) for the duration of the COVID-19 declaration under Section 564(b)(1) of the Act, 21 U.S.C.section 360bbb-3(b)(1), unless the authorization is terminated  or revoked sooner.       Influenza A by PCR NEGATIVE NEGATIVE Final   Influenza B by PCR NEGATIVE NEGATIVE Final    Comment: (NOTE) The Xpert Xpress SARS-CoV-2/FLU/RSV plus assay is intended as an aid in the diagnosis of influenza from Nasopharyngeal swab specimens and should not be used as a sole basis for treatment. Nasal washings and aspirates are unacceptable for Xpert Xpress SARS-CoV-2/FLU/RSV testing.  Fact Sheet for Patients: EntrepreneurPulse.com.au  Fact Sheet for Healthcare  Providers: IncredibleEmployment.be  This test is not yet approved or cleared by the [place] FDA and has been authorized for detection and/or diagnosis of SARS-CoV-2 by FDA under an Emergency Use Authorization (EUA). This EUA will remain in effect (meaning this test can be used) for the duration of the COVID-19 declaration under Section 564(b)(1) of the Act, 21 U.S.C. section 360bbb-3(b)(1), unless the authorization is terminated or revoked.     Resp Syncytial Virus by PCR NEGATIVE NEGATIVE Final    Comment: (NOTE) Fact Sheet for Patients: EntrepreneurPulse.com.au  Fact Sheet for Healthcare Providers: IncredibleEmployment.be  This test is not yet approved or cleared by the [place] FDA and has been authorized for detection and/or diagnosis of SARS-CoV-2 by FDA under an Emergency Use Authorization (EUA). This EUA will remain in effect (meaning this test can be used) for the duration of the COVID-19 declaration under Section 564(b)(1) of the Act, 21 U.S.C. section 360bbb-3(b)(1), unless the authorization is terminated or revoked.  Performed at [facility], [place], [place] [address]   Group A Strep by PCR [facility] Only)     Status: None   Collection Time: 04/23/22  5:19 PM   Specimen: Throat; Sterile Swab  Result Value Ref Range Status   Group A Strep by PCR NOT DETECTED NOT DETECTED Final    Comment: Performed at [facility], [address] [place], [place] [address]  Blood culture (routine x 2)     Status: None (Preliminary result)   Collection Time: 04/23/22  5:23 PM   Specimen: BLOOD RIGHT FOREARM  Result Value Ref Range Status   Specimen Description BLOOD RIGHT FOREARM  Final   Special Requests   Final    BOTTLES DRAWN AEROBIC AND ANAEROBIC Blood Culture adequate volume   Culture   Final    NO GROWTH 2 DAYS Performed at [facility], [place],  [place] [address]    Report Status PENDING  Incomplete  Blood culture (routine x 2)     Status: None (Preliminary result)   Collection Time: 04/23/22  6:00 PM   Specimen: Right Antecubital; Blood  Result Value Ref Range Status   Specimen Description RIGHT ANTECUBITAL  Final   Special Requests   Final    BOTTLES DRAWN AEROBIC AND ANAEROBIC Blood Culture adequate volume   Culture   Final    NO GROWTH 2 DAYS Performed at [facility], [address] [place], [place] [address]    Report Status PENDING  Incomplete    Labs: CBC: Recent Labs  Lab 04/21/22 1124 04/23/22 1452 04/24/22 0414  WBC 9.7 16.3* 10.4  NEUTROABS 6.7  --   --   HGB 10.6* 10.7* 8.1*  HCT 31.3* 32.9* 24.5*  MCV 97.3 98.2 96.8  PLT 683.0* 669* 421*   Basic Metabolic Panel: Recent Labs  Lab 04/21/22 1124 04/23/22 1452 04/24/22 0414  NA 138 130* 134*  K 4.5 3.8 3.1*  CL 99 94* 99  CO2 31 26 26   GLUCOSE 96 117* 113*  BUN 12 11 9   CREATININE 0.98 1.16* 1.00  CALCIUM 9.1 9.1 8.2*  MG 2.1  --   --    Liver Function Tests: Recent Labs  Lab 04/21/22 1124 04/23/22 1452  AST 15 22  ALT 20 18  ALKPHOS 93 101  BILITOT 0.3 1.2  PROT 6.7 7.8  ALBUMIN 3.7 3.5    Discharge time spent:  30 minutes.  Signed: 06/20/22, MD [facility] 04/25/2022

## 2022-04-27 ENCOUNTER — Telehealth: Payer: Self-pay

## 2022-04-27 NOTE — Telephone Encounter (Signed)
Transition Care Management Unsuccessful Follow-up Telephone Call  Date of discharge and from where:  [facility]  Attempts:  1st Attempt  Reason for unsuccessful TCM follow-up call:  Left voice message    

## 2022-04-28 ENCOUNTER — Telehealth: Payer: Self-pay | Admitting: Internal Medicine

## 2022-04-28 ENCOUNTER — Ambulatory Visit: Admission: RE | Admit: 2022-04-28 | Payer: BC Managed Care – PPO | Source: Ambulatory Visit

## 2022-04-28 LAB — CULTURE, BLOOD (ROUTINE X 2)
Culture: NO GROWTH
Culture: NO GROWTH
Special Requests: ADEQUATE
Special Requests: ADEQUATE

## 2022-04-28 NOTE — Telephone Encounter (Signed)
Transition Care Management Unsuccessful Follow-up Telephone Call  Date of discharge and from where:  [facility]  Attempts:  2nd attempt  Reason for unsuccessful TCM follow-up call:  Left voice message

## 2022-04-28 NOTE — Telephone Encounter (Signed)
Patient called and made a hospital follow for 05/04/2022.

## 2022-05-01 NOTE — Telephone Encounter (Signed)
Transition Care Management Unsuccessful Follow-up Telephone Call  Date of discharge and from where:  [facility]   Attempts:  3rd Attempt  Reason for unsuccessful TCM follow-up call:  Left voice message

## 2022-05-04 ENCOUNTER — Ambulatory Visit (INDEPENDENT_AMBULATORY_CARE_PROVIDER_SITE_OTHER): Payer: BC Managed Care – PPO | Admitting: Internal Medicine

## 2022-05-04 ENCOUNTER — Encounter: Payer: Self-pay | Admitting: Internal Medicine

## 2022-05-04 ENCOUNTER — Ambulatory Visit: Payer: BC Managed Care – PPO | Admitting: Internal Medicine

## 2022-05-04 VITALS — BP 154/82 | HR 98 | Temp 98.2°F | Ht 68.0 in | Wt 164.8 lb

## 2022-05-04 DIAGNOSIS — N3941 Urge incontinence: Secondary | ICD-10-CM

## 2022-05-04 DIAGNOSIS — N739 Female pelvic inflammatory disease, unspecified: Secondary | ICD-10-CM

## 2022-05-04 DIAGNOSIS — F17200 Nicotine dependence, unspecified, uncomplicated: Secondary | ICD-10-CM

## 2022-05-04 DIAGNOSIS — D649 Anemia, unspecified: Secondary | ICD-10-CM

## 2022-05-04 DIAGNOSIS — I10 Essential (primary) hypertension: Secondary | ICD-10-CM

## 2022-05-04 DIAGNOSIS — Z09 Encounter for follow-up examination after completed treatment for conditions other than malignant neoplasm: Secondary | ICD-10-CM

## 2022-05-04 DIAGNOSIS — E876 Hypokalemia: Secondary | ICD-10-CM | POA: Diagnosis not present

## 2022-05-04 DIAGNOSIS — K279 Peptic ulcer, site unspecified, unspecified as acute or chronic, without hemorrhage or perforation: Secondary | ICD-10-CM

## 2022-05-04 LAB — BASIC METABOLIC PANEL
BUN: 10 mg/dL (ref 6–23)
CO2: 28 mEq/L (ref 19–32)
Calcium: 9.3 mg/dL (ref 8.4–10.5)
Chloride: 101 mEq/L (ref 96–112)
Creatinine, Ser: 0.8 mg/dL (ref 0.40–1.20)
GFR: 80.34 mL/min (ref >60.00)
Glucose, Bld: 94 mg/dL (ref 70–99)
Potassium: 4 mEq/L (ref 3.5–5.1)
Sodium: 138 mEq/L (ref 135–145)

## 2022-05-04 LAB — CBC WITH DIFFERENTIAL/PLATELET
Basophils Absolute: 0 10*3/uL (ref 0.0–0.1)
Basophils Relative: 0.7 % (ref 0.0–3.0)
Eosinophils Absolute: 0.2 10*3/uL (ref 0.0–0.7)
Eosinophils Relative: 3.7 % (ref 0.0–5.0)
HCT: 29.6 % — ABNORMAL LOW (ref 36.0–46.0)
Hemoglobin: 9.9 g/dL — ABNORMAL LOW (ref 12.0–15.0)
Lymphocytes Relative: 24.6 % (ref 12.0–46.0)
Lymphs Abs: 1.6 10*3/uL (ref 0.7–4.0)
MCHC: 33.5 g/dL (ref 30.0–36.0)
MCV: 96 fl (ref 78.0–100.0)
Monocytes Absolute: 0.4 10*3/uL (ref 0.1–1.0)
Monocytes Relative: 6.9 % (ref 3.0–12.0)
Neutro Abs: 4.2 10*3/uL (ref 1.4–7.7)
Neutrophils Relative %: 64.1 % (ref 43.0–77.0)
Platelets: 413 10*3/uL — ABNORMAL HIGH (ref 150.0–400.0)
RBC: 3.08 Mil/uL — ABNORMAL LOW (ref 3.87–5.11)
RDW: 13.4 % (ref 11.5–15.5)
WBC: 6.5 10*3/uL (ref 4.0–10.5)

## 2022-05-04 LAB — IBC + FERRITIN
Ferritin: 94.7 ng/mL (ref 10.0–291.0)
Iron: 44 ug/dL (ref 42–145)
Saturation Ratios: 18.2 % — ABNORMAL LOW (ref 20.0–50.0)
TIBC: 242.2 ug/dL — ABNORMAL LOW (ref 250.0–450.0)
Transferrin: 173 mg/dL — ABNORMAL LOW (ref 212.0–360.0)

## 2022-05-04 LAB — MAGNESIUM: Magnesium: 2 mg/dL (ref 1.5–2.5)

## 2022-05-04 NOTE — Assessment & Plan Note (Signed)
Patient is stable post discharge and has no new issues or questions about discharge plans at the visit today for hospital follow up. All labs , imaging studies and progress notes from admission were reviewed with patient today   

## 2022-05-04 NOTE — Assessment & Plan Note (Signed)
With history of bladder perforation (iatrogenic complication of hysterectomy), bladder repair, bladder sling,   now with stress incontinence to [facility].

## 2022-05-04 NOTE — Assessment & Plan Note (Addendum)
All meds were suspended during rehospitalization due to hypotension.     Advised to resume metoprolol first and send readings in a week

## 2022-05-04 NOTE — Assessment & Plan Note (Signed)
Persistent was discharged on Jan 13 without supplements.  Checkin mag as well

## 2022-05-04 NOTE — Patient Instructions (Addendum)
Miralax every night for your constipation issues. BUT:  you need more water   60 ounces of water  is your goal   Referral to Dr Sharlett Iles  for bladder issues  Get yourself some Glycerin suppositories  to use for each bowel movement   Resume metoprolol 2 times daily

## 2022-05-04 NOTE — Assessment & Plan Note (Addendum)
Secondary to perforated duodenum Drain was removed during recent rehospitalization

## 2022-05-04 NOTE — Progress Notes (Signed)
Subjective:  Patient ID: Allison Alvarez, female    DOB: 25-Nov-1960  Age: 62 y.o. MRN: [id number]  CC: The primary encounter diagnosis was Hypokalemia. Diagnoses of Anemia, unspecified type, Urgency incontinence, Tobacco use disorder, Peptic ulcer, Hypertension, essential, Pelvic abscess in female, and Hospital discharge follow-up were also pertinent to this visit.   HPI Allison Alvarez presents for hospital follow up Chief Complaint  Patient presents with   Hospitalization Follow-up    Sepsis   Doris was admitted to [facility] on Jan 11 with signs and symptoms of sepsis after presenting with fever to 101.3 , hypotension, weakness, leukocytosis and elevated Cr of  1.16  CT abd pelvis was repeated; noted resolution of abscess,  kept overnight NPO and empiric abx x 24 hours , then stopped when cultures were negative  Drain was removed.  Discharged home without antihypertensives on Jan 13.  Gen surg follow up 1/18    Still having right sided /right upper quadrant "soreness", constipation and fatigue . BP now elevated   Chronic urinary issues.  Requires valsalva maneuver to empty bladder.  For the last year has had infrequent urinary incontinence during sleep and upon waking.  Has not seen urogyn .  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   Discussed seeing [facility]  CONSTIPATION GETTING WORSE.  Tried using a stool softener (1 per night did not help.  Tried a laxative which initially caused soft stools,  then the 2nd time did nothing ('it gets stuck" ) history of internal hemorrhoids.   Outpatient Medications Prior to Visit  Medication Sig Dispense Refill   acetaminophen (TYLENOL) 325 MG tablet Take 650 mg by mouth every 6 (six) hours as needed for moderate pain.     amphetamine-dextroamphetamine (ADDERALL XR) 25 MG 24 hr capsule Take 1 capsule by mouth daily. 90 capsule 0    busPIRone (BUSPAR) 15 MG tablet Take 1 tablet (15 mg total) by mouth 2 (two) times daily. 60 tablet 2   citalopram (CELEXA) 40 MG tablet Take 40 mg by mouth in the morning.     cyanocobalamin (VITAMIN B12) 1000 MCG/ML injection Inject 1 mL (1,000 mcg total) into the muscle every 30 (thirty) days. 1 mL 11   diazepam (VALIUM) 5 MG tablet TAKE ONE TABLET BY MOUTH EVERY 12 HOURS AS NEEDED FOR ANXIETY 60 tablet 0   gabapentin (NEURONTIN) 300 MG capsule Take 300 mg by mouth every evening.     pantoprazole (PROTONIX) 40 MG tablet Take 1 tablet (40 mg total) by mouth 2 (two) times daily. 60 tablet 0   sucralfate (CARAFATE) 1 GM/10ML suspension Take 10 mLs (1 g total) by mouth 4 (four) times daily -  with meals and at bedtime. 420 mL 0   traZODone (DESYREL) 150 MG tablet Take 0.5 tablets (75 mg total) by mouth at bedtime. 90 tablet 1   No facility-administered medications prior to visit.    Review of Systems;  Patient denies headache, fevers, malaise, unintentional weight loss, skin rash, eye pain, sinus congestion and sinus pain, sore throat, dysphagia,  hemoptysis , cough, dyspnea, wheezing, chest pain, palpitations, orthopnea, edema, abdominal pain, nausea, melena, diarrhea, constipation, flank pain, dysuria, hematuria, urinary  Frequency, nocturia, numbness, tingling, seizures,  Focal weakness, Loss of consciousness,  Tremor, insomnia, depression, anxiety, and suicidal ideation.      Objective:  BP (!) 154/82   Pulse 98   Temp  98.2 F (36.8 C) (Oral)   Ht 5\' 8"  (1.727 m)   Wt 164 lb 12.8 oz (74.8 kg)   SpO2 98%   BMI 25.06 kg/m   BP Readings from Last 3 Encounters:  05/04/22 (!) 154/82  04/25/22 116/72  04/21/22 106/68    Wt Readings from Last 3 Encounters:  05/04/22 164 lb 12.8 oz (74.8 kg)  04/23/22 166 lb 0.1 oz (75.3 kg)  04/21/22 165 lb 9.6 oz (75.1 kg)    Physical Exam Vitals reviewed.  Constitutional:      General: She is not in acute distress.    Appearance: Normal  appearance. She is normal weight. She is not ill-appearing, toxic-appearing or diaphoretic.  HENT:     Head: Normocephalic.  Eyes:     General: No scleral icterus.       Right eye: No discharge.        Left eye: No discharge.     Conjunctiva/sclera: Conjunctivae normal.  Cardiovascular:     Rate and Rhythm: Normal rate and regular rhythm.     Heart sounds: Normal heart sounds.  Pulmonary:     Effort: Pulmonary effort is normal. No respiratory distress.     Breath sounds: Normal breath sounds.  Musculoskeletal:        General: Normal range of motion.  Skin:    General: Skin is warm and dry.  Neurological:     General: No focal deficit present.     Mental Status: She is alert and oriented to person, place, and time. Mental status is at baseline.  Psychiatric:        Mood and Affect: Mood normal.        Behavior: Behavior normal.        Thought Content: Thought content normal.        Judgment: Judgment normal.    Lab Results  Component Value Date   HGBA1C 5.5 09/17/2021    Lab Results  Component Value Date   CREATININE 0.80 05/04/2022   CREATININE 1.00 04/24/2022   CREATININE 1.16 (H) 04/23/2022    Lab Results  Component Value Date   WBC 6.5 05/04/2022   HGB 9.9 (L) 05/04/2022   HCT 29.6 (L) 05/04/2022   PLT 413.0 (H) 05/04/2022   GLUCOSE 94 05/04/2022   CHOL 208 (H) 09/17/2021   TRIG 158.0 (H) 09/17/2021   HDL 53.00 09/17/2021   LDLCALC 123 (H) 09/17/2021   ALT 18 04/23/2022   AST 22 04/23/2022   NA 138 05/04/2022   K 4.0 05/04/2022   CL 101 05/04/2022   CREATININE 0.80 05/04/2022   BUN 10 05/04/2022   CO2 28 05/04/2022   TSH 1.03 09/17/2021   INR 1.1 04/12/2022   HGBA1C 5.5 09/17/2021    CT ABDOMEN WO CONTRAST  Result Date: 04/23/2022 CLINICAL DATA:  History of duodenal perforation repair, fever EXAM: CT ABDOMEN WITHOUT CONTRAST TECHNIQUE: Multidetector CT imaging of the abdomen was performed following the standard protocol without IV contrast.  RADIATION DOSE REDUCTION: This exam was performed according to the departmental dose-optimization program which includes automated exposure control, adjustment of the mA and/or kV according to patient size and/or use of iterative reconstruction technique. COMPARISON:  CT 04/11/2022, 04/23/2022, 04/08/2022 FINDINGS: Lower chest: Lung bases demonstrate mild subpleural reticulation. No acute airspace disease. Hepatobiliary: No focal liver abnormality is seen. Status post cholecystectomy. No biliary dilatation. Pancreas: Unremarkable. No pancreatic ductal dilatation or surrounding inflammatory changes. Spleen: Normal in size without focal abnormality. Adrenals/Urinary Tract: Adrenal glands are normal.  Excreted contrast in the renal collecting systems. Stomach/Bowel: Enteral contrast has been administered and passes into the proximal small bowel. Wall thickening of the pylorus and duodenal bulb with residual soft tissue stranding and inflammation. Several small gas locules anterior to the proximal duodenum, when compared with 04/11/2022, this is decreased. Vascular/Lymphatic: Mild aortic atherosclerosis. No aneurysm. No suspicious lymph nodes Other: Negative for significant ascites. Musculoskeletal: No acute osseous abnormality IMPRESSION: 1. Interval administration of enteral contrast without definitive evidence for extraluminal contrast/leak. Wall thickening and mild inflammatory change at the pylorus and duodenal bulb, likely resolving postoperative change. Several small extraluminal gas locules anterior to the proximal duodenum in the region of previously noted perforation; similar distribution of gas locules as compared with 04/11/2022 and overall decreased compared with that exam. Findings could represent slowly resolving postoperative gas, with alternative consideration of contained leak though no contrast extravasates or collects in this region. Consider short interval CT follow-up to ensure no increasing  extraluminal gas in the region. Electronically Signed   By: Donavan Foil M.D.   On: 04/23/2022 20:48   DG Chest Port 1 View  Result Date: 04/23/2022 CLINICAL DATA:  Fever EXAM: PORTABLE CHEST 1 VIEW COMPARISON:  02/23/2018 FINDINGS: Heart size upper limits of normal. Left basilar atelectasis. Mild bronchial wall thickening and coarsening of the interstitium favored chronic. No pleural effusion or pneumothorax. No displaced rib fractures. IMPRESSION: Chronic bronchitic changes.  No acute abnormality Electronically Signed   By: Placido Sou M.D.   On: 04/23/2022 20:43   CT ABDOMEN PELVIS W CONTRAST  Result Date: 04/23/2022 CLINICAL DATA:  Acute abdominal pain, recent surgery 2 weeks ago. EXAM: CT ABDOMEN AND PELVIS WITH CONTRAST TECHNIQUE: Multidetector CT imaging of the abdomen and pelvis was performed using the standard protocol following bolus administration of intravenous contrast. RADIATION DOSE REDUCTION: This exam was performed according to the departmental dose-optimization program which includes automated exposure control, adjustment of the mA and/or kV according to patient size and/or use of iterative reconstruction technique. CONTRAST:  134mL OMNIPAQUE IOHEXOL 300 MG/ML  SOLN COMPARISON:  CT abdomen and pelvis 04/11/2022. FINDINGS: Lower chest: There is minimal atelectasis in the left lung base. Hepatobiliary: No focal liver abnormality is seen. Status post cholecystectomy. No biliary dilatation. Pancreas: Unremarkable. No pancreatic ductal dilatation or surrounding inflammatory changes. Spleen: Normal in size without focal abnormality. Adrenals/Urinary Tract: Adrenal glands are unremarkable. Kidneys are normal, without renal calculi, focal lesion, or hydronephrosis. Bladder is unremarkable. Stomach/Bowel: There is some wall thickening of the anterior wall of the gastric antrum with a small amount of inflammatory stranding few small bubbles of free air. Free air has decreased when compared to  prior examination in this region. The stomach is otherwise within normal limits. Small hiatal hernia is present. No evidence for bowel obstruction or pneumatosis. The appendix is surgically absent. Vascular/Lymphatic: Aortic atherosclerosis. No enlarged abdominal or pelvic lymph nodes. Reproductive: Status post hysterectomy. No adnexal masses. Other: Right posterior approach percutaneous drainage catheter is seen coiled in the pelvis. Previously identified pelvic fluid collection has resolved in the interval. Presacral edema persists as well as mild edema in the right pelvis. There is no evidence for focal hematoma, new fluid collection or free fluid. There is no focal abdominal wall hernia. Musculoskeletal: No acute or significant osseous findings. IMPRESSION: 1. Interval resolution of pelvic fluid collection with percutaneous drainage catheter in place. 2. Persistent presacral edema and mild edema in the right pelvis. 3. Wall thickening of the anterior wall of the gastric antrum with  surrounding inflammatory stranding and few small bubbles of free air. Findings are concerning for gastritis/peptic ulcer disease. Aortic Atherosclerosis (ICD10-I70.0). Electronically Signed   By: Darliss Cheney M.D.   On: 04/23/2022 19:03    Assessment & Plan:  .Hypokalemia Assessment & Plan: Persistent was discharged on Jan 13 without supplements.  Checkin mag as well   Orders: -     Basic metabolic panel -     Magnesium  Anemia, unspecified type -     CBC with Differential/Platelet -     IBC + Ferritin  Urgency incontinence Assessment & Plan: With history of bladder perforation (iatrogenic complication of hysterectomy), bladder repair, bladder sling,   now with stress incontinence to [facility].   Orders: -     Ambulatory referral to Urogynecology  Tobacco use disorder Assessment & Plan: Vaping several times daily.  Counselling given    Peptic ulcer Assessment & Plan: With duodenal perforation resulting in  GI bleed, pelvic abscess. Secondray to [employer] use of NSAIDS>  continue carafate, PPI> To see GI tomorrow    Hypertension, essential Assessment & Plan: All meds were suspended during rehospitalization due to hypotension.     Advised to resume metoprolol first and send readings in a week    Pelvic abscess in female Assessment & Plan: Secondary to perforated duodenum Drain was removed during recent rehospitalization    Hospital discharge follow-up Assessment & Plan: Patient is stable post discharge and has no new issues or questions about discharge plans at the visit today for hospital follow up. All labs , imaging studies and progress notes from admission were reviewed with patient today         I provided 30 minutes of face-to-face time during this encounter reviewing patient's last visit with me, patient's  most recent visit with cardiology,  nephrology,  and neurology,  recent surgical and non surgical procedures, previous  labs and imaging studies, counseling on currently addressed issues,  and post visit ordering to diagnostics and therapeutics .   Follow-up: No follow-ups on file.   Sherlene Shams, MD

## 2022-05-04 NOTE — Assessment & Plan Note (Signed)
Vaping several times daily.  Counselling given

## 2022-05-04 NOTE — Assessment & Plan Note (Addendum)
With duodenal perforation resulting in GI bleed, pelvic abscess. Secondray to [employer] use of NSAIDS>  continue carafate, PPI> To see GI tomorrow

## 2022-05-05 MED ORDER — IRON (FERROUS SULFATE) 325 (65 FE) MG PO TABS: 325.0000 mg | ORAL_TABLET | ORAL | 0 refills | Status: DC

## 2022-05-05 NOTE — Addendum Note (Signed)
Addended by: Crecencio Mc on: 05/05/2022 08:38 PM   Modules accepted: Orders

## 2022-05-11 DIAGNOSIS — K5900 Constipation, unspecified: Secondary | ICD-10-CM | POA: Diagnosis not present

## 2022-05-11 DIAGNOSIS — K265 Chronic or unspecified duodenal ulcer with perforation: Secondary | ICD-10-CM | POA: Diagnosis not present

## 2022-05-17 IMAGING — MG DIGITAL DIAGNOSTIC BILAT W/ TOMO W/ CAD
6 of 12 series · 6 of 36 positions shown · non-contrast
Comparison: Previous exams.

CLINICAL DATA: 52-year-old female with tender areas of concern in
the left breast.

EXAM:
DIGITAL DIAGNOSTIC BILATERAL MAMMOGRAM WITH TOMOSYNTHESIS AND CAD;
ULTRASOUND LEFT BREAST LIMITED
TECHNIQUE: Bilateral digital diagnostic mammography and breast tomosynthesis
was performed. The images were evaluated with computer-aided
detection.; Targeted ultrasound examination of the left breast was
performed.

[L CC synth-2D (1 of 2)]
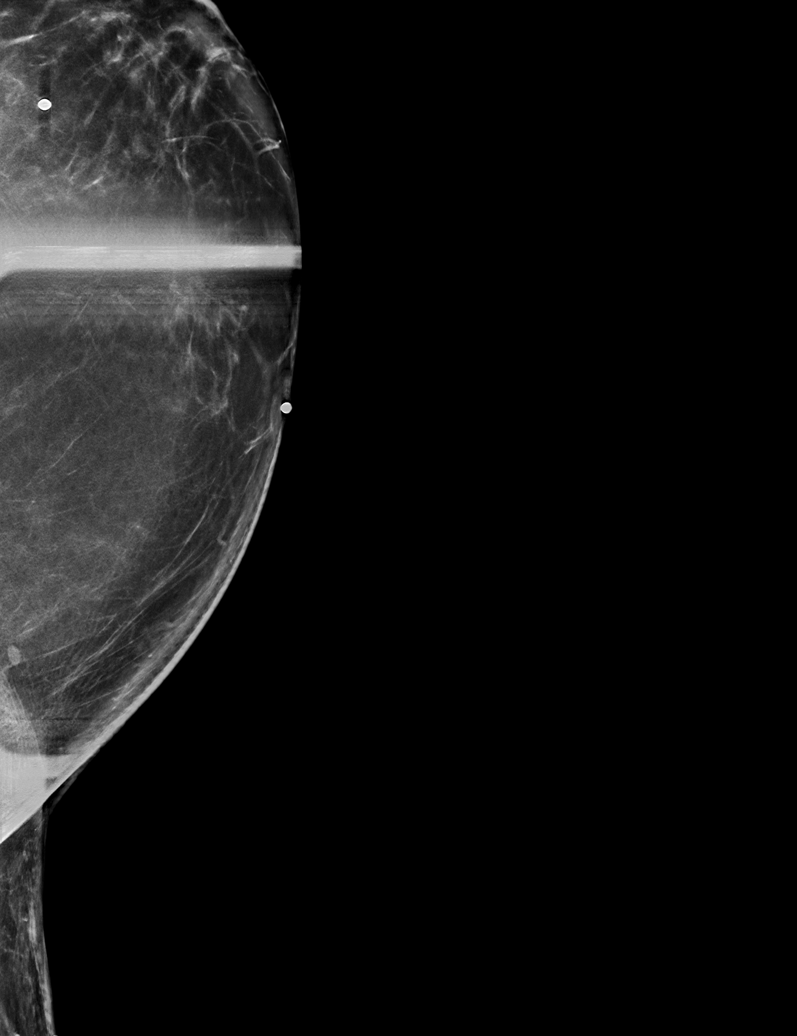

[R MLO synth-2D]
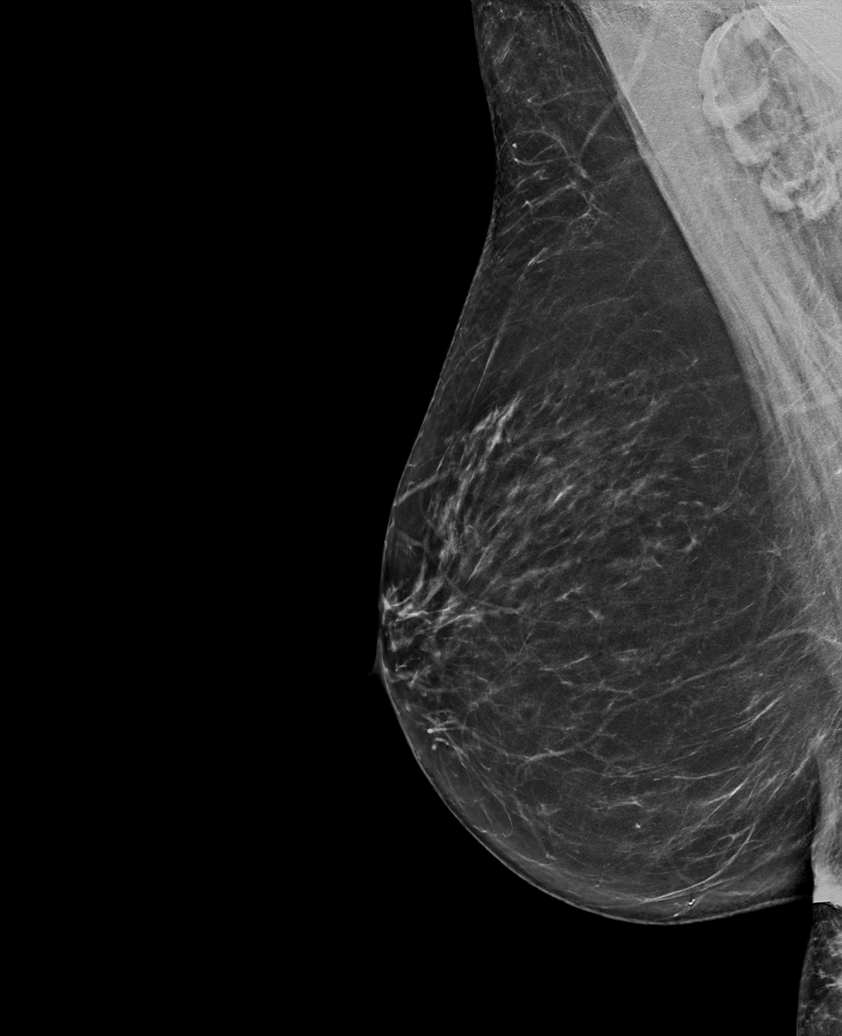

[L MLO synth-2D (1 of 2)]
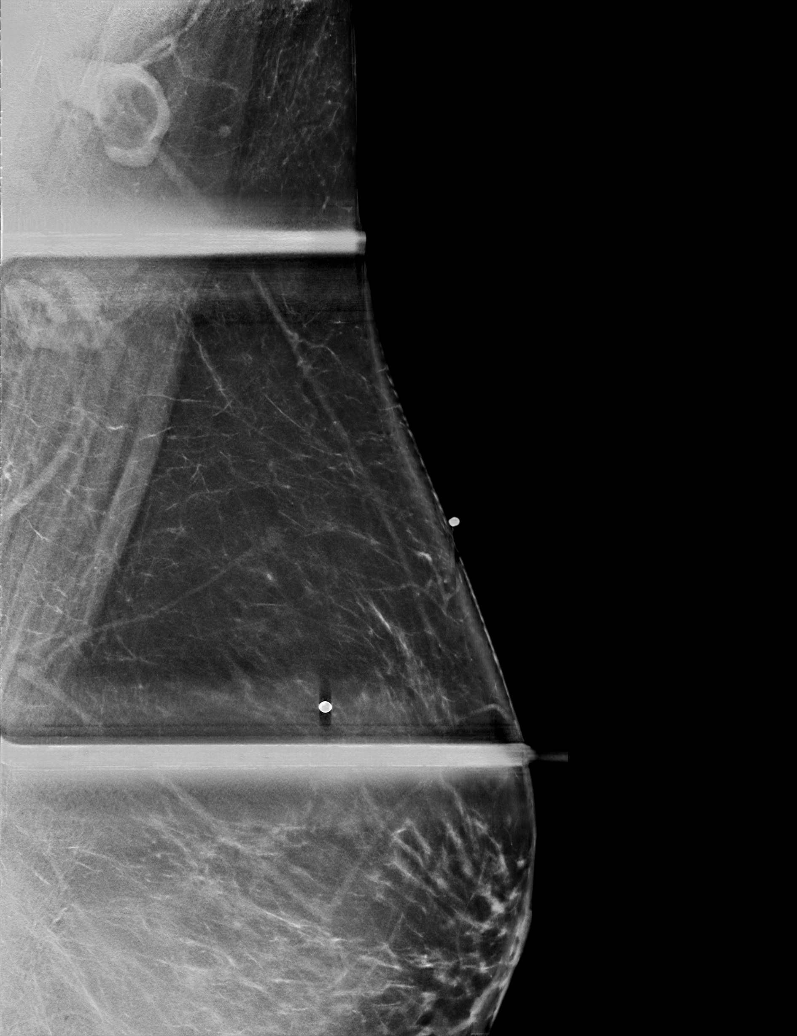

[L MLO synth-2D (2 of 2)]
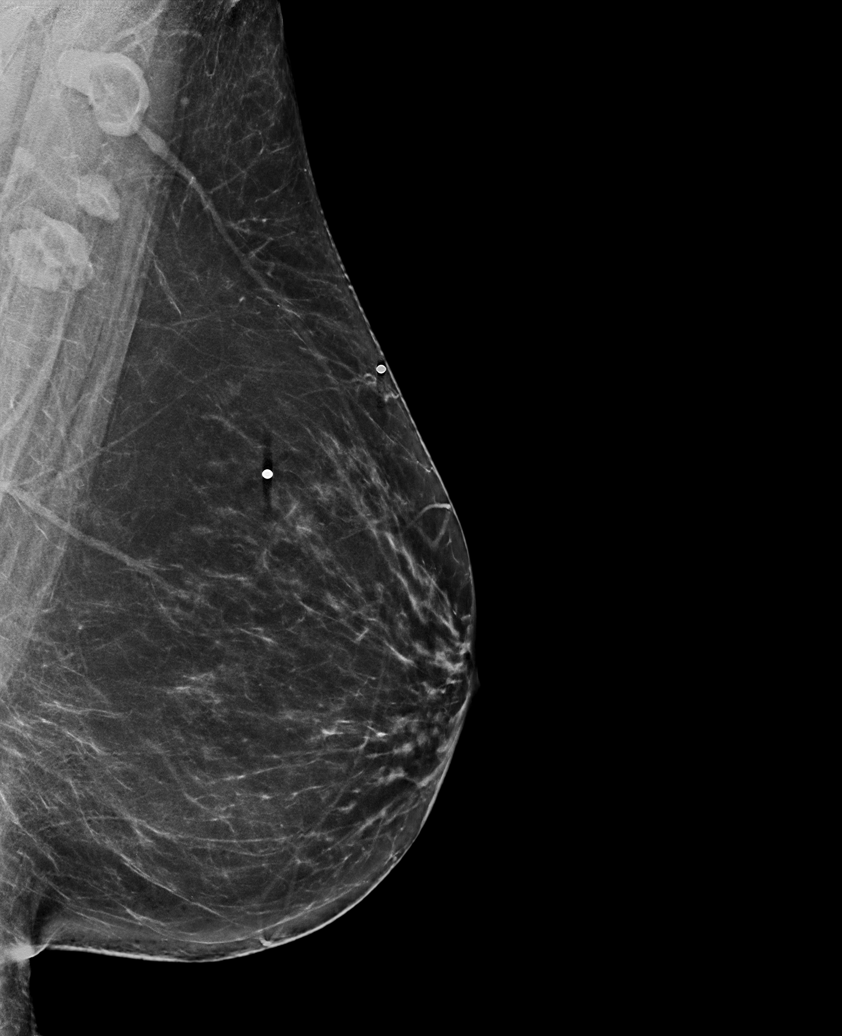

[L CC synth-2D (2 of 2)]
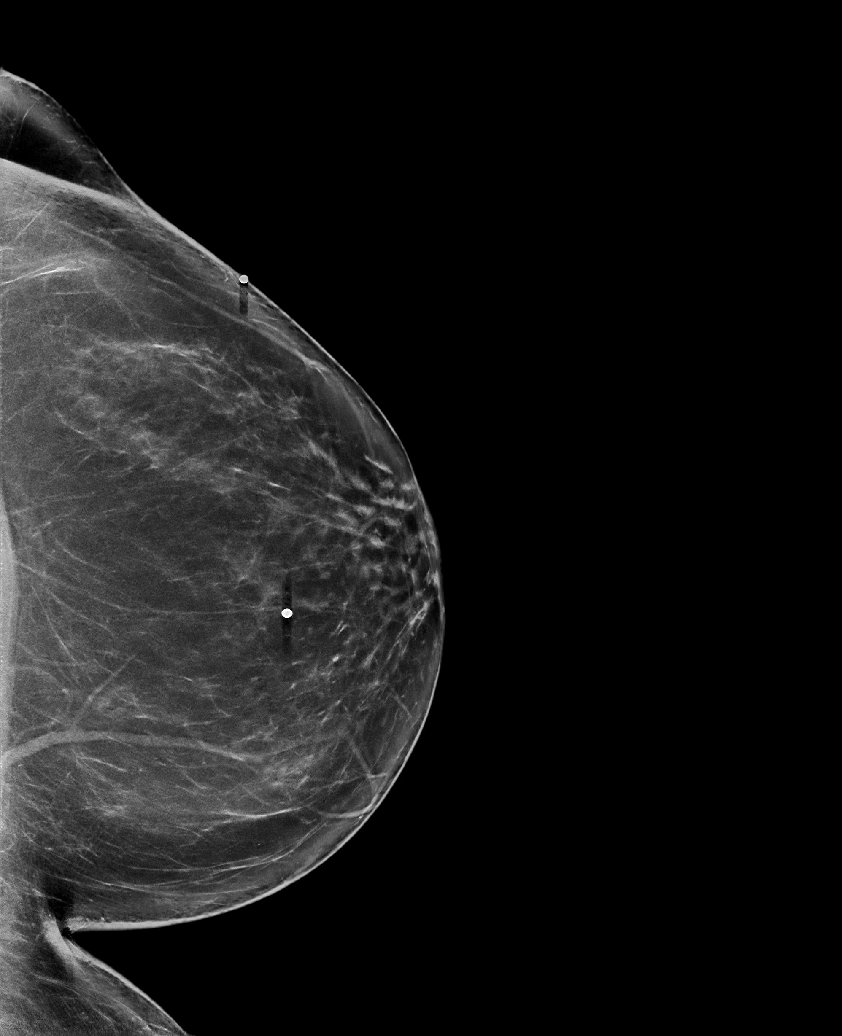

[R CC synth-2D]
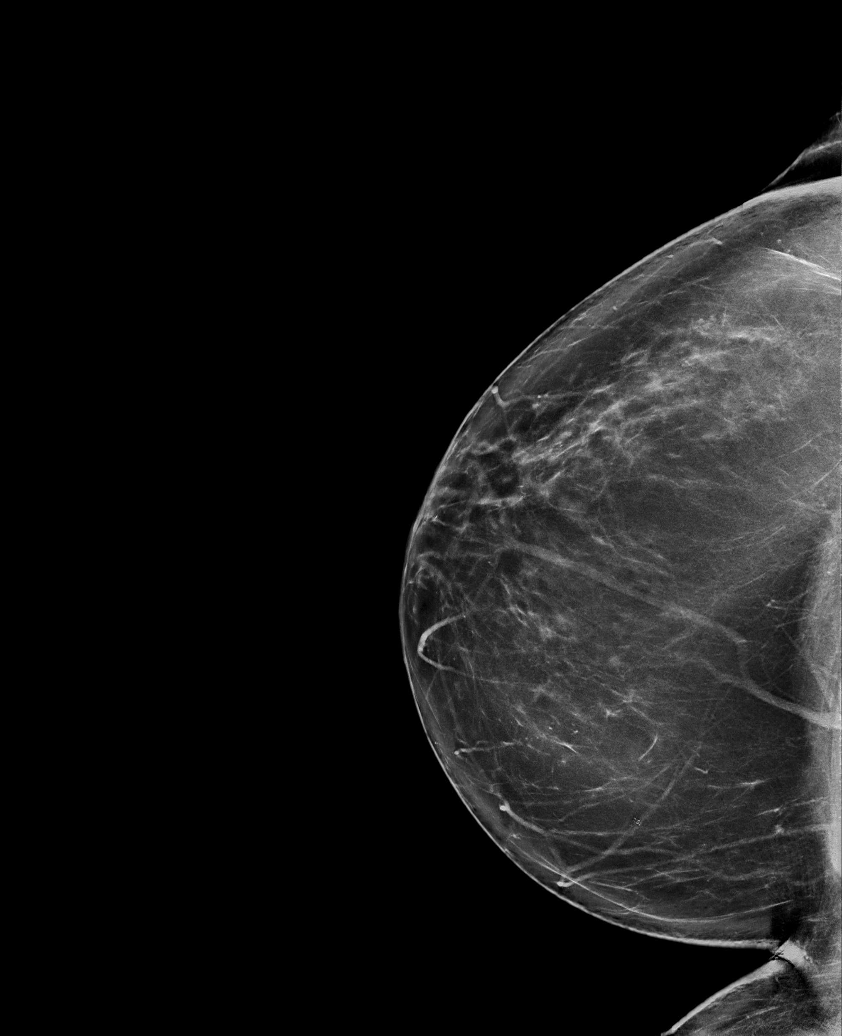

[6 of 36 positions shown; findings below may reference images not displayed]

ACR Breast Density Category b: There are scattered areas of
fibroglandular density.
FINDINGS: No suspicious masses or calcifications seen in either breast. Spot
compression tangential tomograms were performed over the areas of
concern in the left breast with no definite abnormality seen.

Targeted ultrasound of the left breast was performed. No suspicious
masses or abnormality seen, only normal-appearing fibroglandular
tissue identified.
IMPRESSION: 1. No mammographic or sonographic abnormalities at the sites of
concern in the left breast.

2.  No mammographic evidence of malignancy in either breast.

RECOMMENDATION:
1. Recommend further management of the left breast tender areas of
concern be based on clinical assessment.

2.  Screening mammogram in one year.(Code:[id number]-[id number])

I have discussed the findings and recommendations with the patient.
If applicable, a reminder letter will be sent to the patient
regarding the next appointment.

BI-RADS CATEGORY  1: Negative.

## 2022-05-19 ENCOUNTER — Other Ambulatory Visit: Payer: Self-pay | Admitting: Internal Medicine

## 2022-05-20 ENCOUNTER — Other Ambulatory Visit: Payer: Self-pay

## 2022-05-20 ENCOUNTER — Ambulatory Visit: Payer: BC Managed Care – PPO | Admitting: Internal Medicine

## 2022-05-20 ENCOUNTER — Encounter: Payer: Self-pay | Admitting: Internal Medicine

## 2022-05-20 VITALS — BP 118/78 | HR 74 | Temp 99.2°F | Ht 68.0 in | Wt 162.0 lb

## 2022-05-20 DIAGNOSIS — L280 Lichen simplex chronicus: Secondary | ICD-10-CM

## 2022-05-20 DIAGNOSIS — R051 Acute cough: Secondary | ICD-10-CM | POA: Diagnosis not present

## 2022-05-20 DIAGNOSIS — R9431 Abnormal electrocardiogram [ECG] [EKG]: Secondary | ICD-10-CM

## 2022-05-20 DIAGNOSIS — E876 Hypokalemia: Secondary | ICD-10-CM

## 2022-05-20 DIAGNOSIS — D500 Iron deficiency anemia secondary to blood loss (chronic): Secondary | ICD-10-CM

## 2022-05-20 DIAGNOSIS — F411 Generalized anxiety disorder: Secondary | ICD-10-CM | POA: Diagnosis not present

## 2022-05-20 DIAGNOSIS — K279 Peptic ulcer, site unspecified, unspecified as acute or chronic, without hemorrhage or perforation: Secondary | ICD-10-CM

## 2022-05-20 DIAGNOSIS — E871 Hypo-osmolality and hyponatremia: Secondary | ICD-10-CM

## 2022-05-20 DIAGNOSIS — S060X0S Concussion without loss of consciousness, sequela: Secondary | ICD-10-CM

## 2022-05-20 LAB — POCT INFLUENZA A/B
Influenza A, POC: NEGATIVE
Influenza B, POC: NEGATIVE

## 2022-05-20 LAB — POCT RAPID STREP A (OFFICE): Rapid Strep A Screen: NEGATIVE

## 2022-05-20 MED ORDER — SERTRALINE HCL 100 MG PO TABS: 100.0000 mg | ORAL_TABLET | Freq: Every day | ORAL | 3 refills | Status: DC

## 2022-05-20 MED ORDER — GABAPENTIN 100 MG PO CAPS: 100.0000 mg | ORAL_CAPSULE | Freq: Two times a day (BID) | ORAL | 1 refills | Status: DC

## 2022-05-20 MED ORDER — ALPRAZOLAM 0.5 MG PO TABS: 0.5000 mg | ORAL_TABLET | Freq: Every evening | ORAL | 2 refills | Status: DC | PRN

## 2022-05-20 MED ORDER — GABAPENTIN 300 MG PO CAPS: 300.0000 mg | ORAL_CAPSULE | Freq: Every evening | ORAL | 3 refills | Status: DC

## 2022-05-20 NOTE — Progress Notes (Unsigned)
Subjective:  Patient ID: Allison Alvarez, female    DOB: April 26, 1960  Age: 62 y.o. MRN: [id number]  CC: The primary encounter diagnosis was Acute cough. Diagnoses of Abnormal electrocardiogram (ECG) (EKG), GAD (generalized anxiety disorder), Neurodermatitis, Peptic ulcer, Hyponatremia, Hypokalemia, Anemia due to GI blood loss, and Concussion without loss of consciousness, sequela (HCC) were also pertinent to this visit.   HPI Allison Alvarez presents for  Chief Complaint  Patient presents with   Medical Management of Chronic Issues    4 week follow up    FOLLOW UP ON ANEMIA, ABD PAIN SECONDARY TO  DUOENAL ULCER WITH PERFORATION   Since her lasrtvisit she has had an Interim development of cough, congestion and body aches 2 days ago, but tested COVID negative at home    Still constipated,  HAVING rectal pain even with LOOSE Stools.  Also notes that she has lower pelvic pain  when she valsalva's to  urinate.    Abnormal EKG:  noted during hospitalization.   Incomplete RBBB ,  TWI in chest leads.   Anxiety about health:  multiple major health crises over the past year,  feels in fear of next problem.  Has taken celexa for years, not sleeping well due persistent worrisome thoughts about her health.    Pruritus;  skin of torso,  feels a bump then scratches it off.  Constant.      Outpatient Medications Prior to Visit  Medication Sig Dispense Refill   acetaminophen (TYLENOL) 325 MG tablet Take 650 mg by mouth every 6 (six) hours as needed for moderate pain.     amphetamine-dextroamphetamine (ADDERALL XR) 25 MG 24 hr capsule Take 1 capsule by mouth daily. 90 capsule 0   busPIRone (BUSPAR) 15 MG tablet Take 1 tablet (15 mg total) by mouth 2 (two) times daily. 60 tablet 2   cyanocobalamin (VITAMIN B12) 1000 MCG/ML injection Inject 1 mL (1,000 mcg total) into the muscle every 30 (thirty) days. 1 mL 11   gabapentin (NEURONTIN) 300 MG capsule Take 1 capsule (300 mg total) by mouth every evening. 30  capsule 3   Iron, Ferrous Sulfate, 325 (65 Fe) MG TABS Take 325 mg by mouth every other day. Take with orange juice or food 45 tablet 0   citalopram (CELEXA) 40 MG tablet Take 40 mg by mouth in the morning.     diazepam (VALIUM) 5 MG tablet TAKE ONE TABLET BY MOUTH EVERY 12 HOURS AS NEEDED FOR ANXIETY 60 tablet 0   traZODone (DESYREL) 150 MG tablet Take 0.5 tablets (75 mg total) by mouth at bedtime. 90 tablet 1   pantoprazole (PROTONIX) 40 MG tablet Take 1 tablet (40 mg total) by mouth 2 (two) times daily. 60 tablet 0   sucralfate (CARAFATE) 1 GM/10ML suspension Take 10 mLs (1 g total) by mouth 4 (four) times daily -  with meals and at bedtime. 420 mL 0   No facility-administered medications prior to visit.    Review of Systems;  Patient denies  fevers, malaise, unintentional weight loss, skin rash, eye pain, sinus pain, sore throat, dysphagia,  hemoptysis , , dyspnea, wheezing, chest pain, palpitations, orthopnea, edema, abdominal pain, nausea, melena, diarrhea, constipation, flank pain, dysuria, hematuria, urinary  Frequency, nocturia, numbness, tingling, seizures,  Focal weakness, Loss of consciousness,  Tremor, insomnia, depression, anxiety, and suicidal ideation.      Objective:  BP 118/78   Pulse 74   Temp 99.2 F (37.3 C) (Oral)   Ht 5\' 8"  (1.727 m)  Wt 162 lb (73.5 kg)   SpO2 97%   BMI 24.63 kg/m   BP Readings from Last 3 Encounters:  05/20/22 118/78  05/04/22 (!) 154/82  04/25/22 116/72    Wt Readings from Last 3 Encounters:  05/20/22 162 lb (73.5 kg)  05/04/22 164 lb 12.8 oz (74.8 kg)  04/23/22 166 lb 0.1 oz (75.3 kg)    Physical Exam  Lab Results  Component Value Date   HGBA1C 5.5 09/17/2021    Lab Results  Component Value Date   CREATININE 0.80 05/04/2022   CREATININE 1.00 04/24/2022   CREATININE 1.16 (H) 04/23/2022    Lab Results  Component Value Date   WBC 6.5 05/04/2022   HGB 9.9 (L) 05/04/2022   HCT 29.6 (L) 05/04/2022   PLT 413.0 (H)  05/04/2022   GLUCOSE 94 05/04/2022   CHOL 208 (H) 09/17/2021   TRIG 158.0 (H) 09/17/2021   HDL 53.00 09/17/2021   LDLCALC 123 (H) 09/17/2021   ALT 18 04/23/2022   AST 22 04/23/2022   NA 138 05/04/2022   K 4.0 05/04/2022   CL 101 05/04/2022   CREATININE 0.80 05/04/2022   BUN 10 05/04/2022   CO2 28 05/04/2022   TSH 1.03 09/17/2021   INR 1.1 04/12/2022   HGBA1C 5.5 09/17/2021    CT ABDOMEN WO CONTRAST  Result Date: 04/23/2022 CLINICAL DATA:  History of duodenal perforation repair, fever EXAM: CT ABDOMEN WITHOUT CONTRAST TECHNIQUE: Multidetector CT imaging of the abdomen was performed following the standard protocol without IV contrast. RADIATION DOSE REDUCTION: This exam was performed according to the departmental dose-optimization program which includes automated exposure control, adjustment of the mA and/or kV according to patient size and/or use of iterative reconstruction technique. COMPARISON:  CT 04/11/2022, 04/23/2022, 04/08/2022 FINDINGS: Lower chest: Lung bases demonstrate mild subpleural reticulation. No acute airspace disease. Hepatobiliary: No focal liver abnormality is seen. Status post cholecystectomy. No biliary dilatation. Pancreas: Unremarkable. No pancreatic ductal dilatation or surrounding inflammatory changes. Spleen: Normal in size without focal abnormality. Adrenals/Urinary Tract: Adrenal glands are normal. Excreted contrast in the renal collecting systems. Stomach/Bowel: Enteral contrast has been administered and passes into the proximal small bowel. Wall thickening of the pylorus and duodenal bulb with residual soft tissue stranding and inflammation. Several small gas locules anterior to the proximal duodenum, when compared with 04/11/2022, this is decreased. Vascular/Lymphatic: Mild aortic atherosclerosis. No aneurysm. No suspicious lymph nodes Other: Negative for significant ascites. Musculoskeletal: No acute osseous abnormality IMPRESSION: 1. Interval administration of  enteral contrast without definitive evidence for extraluminal contrast/leak. Wall thickening and mild inflammatory change at the pylorus and duodenal bulb, likely resolving postoperative change. Several small extraluminal gas locules anterior to the proximal duodenum in the region of previously noted perforation; similar distribution of gas locules as compared with 04/11/2022 and overall decreased compared with that exam. Findings could represent slowly resolving postoperative gas, with alternative consideration of contained leak though no contrast extravasates or collects in this region. Consider short interval CT follow-up to ensure no increasing extraluminal gas in the region. Electronically Signed   By: Donavan Foil M.D.   On: 04/23/2022 20:48   DG Chest Port 1 View  Result Date: 04/23/2022 CLINICAL DATA:  Fever EXAM: PORTABLE CHEST 1 VIEW COMPARISON:  02/23/2018 FINDINGS: Heart size upper limits of normal. Left basilar atelectasis. Mild bronchial wall thickening and coarsening of the interstitium favored chronic. No pleural effusion or pneumothorax. No displaced rib fractures. IMPRESSION: Chronic bronchitic changes.  No acute abnormality Electronically Signed  By: Placido Sou M.D.   On: 04/23/2022 20:43   CT ABDOMEN PELVIS W CONTRAST  Result Date: 04/23/2022 CLINICAL DATA:  Acute abdominal pain, recent surgery 2 weeks ago. EXAM: CT ABDOMEN AND PELVIS WITH CONTRAST TECHNIQUE: Multidetector CT imaging of the abdomen and pelvis was performed using the standard protocol following bolus administration of intravenous contrast. RADIATION DOSE REDUCTION: This exam was performed according to the departmental dose-optimization program which includes automated exposure control, adjustment of the mA and/or kV according to patient size and/or use of iterative reconstruction technique. CONTRAST:  152mL OMNIPAQUE IOHEXOL 300 MG/ML  SOLN COMPARISON:  CT abdomen and pelvis 04/11/2022. FINDINGS: Lower chest: There  is minimal atelectasis in the left lung base. Hepatobiliary: No focal liver abnormality is seen. Status post cholecystectomy. No biliary dilatation. Pancreas: Unremarkable. No pancreatic ductal dilatation or surrounding inflammatory changes. Spleen: Normal in size without focal abnormality. Adrenals/Urinary Tract: Adrenal glands are unremarkable. Kidneys are normal, without renal calculi, focal lesion, or hydronephrosis. Bladder is unremarkable. Stomach/Bowel: There is some wall thickening of the anterior wall of the gastric antrum with a small amount of inflammatory stranding few small bubbles of free air. Free air has decreased when compared to prior examination in this region. The stomach is otherwise within normal limits. Small hiatal hernia is present. No evidence for bowel obstruction or pneumatosis. The appendix is surgically absent. Vascular/Lymphatic: Aortic atherosclerosis. No enlarged abdominal or pelvic lymph nodes. Reproductive: Status post hysterectomy. No adnexal masses. Other: Right posterior approach percutaneous drainage catheter is seen coiled in the pelvis. Previously identified pelvic fluid collection has resolved in the interval. Presacral edema persists as well as mild edema in the right pelvis. There is no evidence for focal hematoma, new fluid collection or free fluid. There is no focal abdominal wall hernia. Musculoskeletal: No acute or significant osseous findings. IMPRESSION: 1. Interval resolution of pelvic fluid collection with percutaneous drainage catheter in place. 2. Persistent presacral edema and mild edema in the right pelvis. 3. Wall thickening of the anterior wall of the gastric antrum with surrounding inflammatory stranding and few small bubbles of free air. Findings are concerning for gastritis/peptic ulcer disease. Aortic Atherosclerosis (ICD10-I70.0). Electronically Signed   By: Ronney Asters M.D.   On: 04/23/2022 19:03    Assessment & Plan:  .Acute cough Assessment &  Plan: Treat as viral infection with otc meds  COVID NEGATIVE HOME TEST  Orders: -     POCT rapid strep A -     Respiratory virus panel -     POCT Influenza A/B  Abnormal electrocardiogram (ECG) (EKG) Assessment & Plan: I have ordered and reviewed a 12 lead EKG and find that there are no acute changes and patient is in sinus rhythm.   Incomplete RBBB still present.  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  Adding gabapentin 100 mg bid    Peptic ulcer Assessment & Plan: With h/o perforation and GI bleed.  For repeat EGD in 2 weeks . Advised to suspend iron 7 days prior to procedure    Hyponatremia Assessment & Plan: RESOLVED WITH IV FLUIDS  Lab Results  Component Value Date   NA 138 05/04/2022   K 4.0 05/04/2022   CL 101 05/04/2022   CO2 28 05/04/2022  Hypokalemia Assessment & Plan: RESOLVED.  Lab Results  Component Value Date   NA 138 05/04/2022   K 4.0 05/04/2022   CL 101 05/04/2022   CO2 28 05/04/2022      Anemia due to GI blood loss Assessment & Plan: IMPROVED post transfusion with iron supplementation   Continue supplementation , suspend 7 days pre EGD  Lab Results  Component Value Date   IRON 44 05/04/2022   TIBC 242.2 (L) 05/04/2022   FERRITIN 94.7 05/04/2022     Lab Results  Component Value Date   WBC 6.5 05/04/2022   HGB 9.9 (L) 05/04/2022   HCT 29.6 (L) 05/04/2022   MCV 96.0 05/04/2022   PLT 413.0 (H) 05/04/2022      Concussion without loss of consciousness, sequela (HCC)  Other orders -     Gabapentin; Take 1 capsule (100 mg total) by mouth 2 (two) times daily.  Dispense: 60 capsule; Refill: 1 -     Sertraline HCl; Take 1 tablet (100 mg total) by mouth daily.  Dispense: 30 tablet; Refill: 3 -      ALPRAZolam; Take 1 tablet (0.5 mg total) by mouth at bedtime as needed for anxiety.  Dispense: 30 tablet; Refill: 2     I provided 30 minutes of face-to-face time during this encounter reviewing patient's last visit with me, patient's  most recent visit with GI  general  surg ,  recent surgical and non surgical procedures, previous  labs and imaging studies, counseling on currently addressed issues,  and post visit ordering to diagnostics and therapeutics .   Follow-up: No follow-ups on file.   Crecencio Mc, MD

## 2022-05-20 NOTE — Patient Instructions (Addendum)
Your Last iron dose should be no later than feb 12   We'll try add a low dose of gabapentin 100 mg ,  up to 2 times daily  (if  needed ) for the itching   Let's try switching your antidepressant from citalopram to sertraline to help manage manage the compulsive thoughts  You can use alprazolam as needed for sleep initiation or sleep reentry

## 2022-05-21 DIAGNOSIS — D500 Iron deficiency anemia secondary to blood loss (chronic): Secondary | ICD-10-CM | POA: Insufficient documentation

## 2022-05-21 DIAGNOSIS — R051 Acute cough: Secondary | ICD-10-CM | POA: Insufficient documentation

## 2022-05-21 DIAGNOSIS — R059 Cough, unspecified: Secondary | ICD-10-CM | POA: Insufficient documentation

## 2022-05-21 DIAGNOSIS — S060X0S Concussion without loss of consciousness, sequela: Secondary | ICD-10-CM | POA: Insufficient documentation

## 2022-05-21 NOTE — Assessment & Plan Note (Signed)
I have ordered and reviewed a 12 lead EKG and find that there are no acute changes and patient is in sinus rhythm.   Incomplete RBBB still present.  No urgent workup needed

## 2022-05-21 NOTE — Assessment & Plan Note (Signed)
With h/o perforation and GI bleed.  For repeat EGD in 2 weeks . Advised to suspend iron 7 days prior to procedure

## 2022-05-21 NOTE — Assessment & Plan Note (Signed)
Change from celexa to zoloft for obsessive worrying,  adding alprazolam prn insomnia

## 2022-05-21 NOTE — Assessment & Plan Note (Signed)
Recurrent since her health issues begain.  Adding gabapentin 100 mg bid

## 2022-05-21 NOTE — Assessment & Plan Note (Signed)
IMPROVED post transfusion with iron supplementation   Continue supplementation , suspend 7 days pre EGD  Lab Results  Component Value Date   IRON 44 05/04/2022   TIBC 242.2 (L) 05/04/2022   FERRITIN 94.7 05/04/2022     Lab Results  Component Value Date   WBC 6.5 05/04/2022   HGB 9.9 (L) 05/04/2022   HCT 29.6 (L) 05/04/2022   MCV 96.0 05/04/2022   PLT 413.0 (H) 05/04/2022

## 2022-05-21 NOTE — Assessment & Plan Note (Signed)
RESOLVED WITH IV FLUIDS  Lab Results  Component Value Date   NA 138 05/04/2022   K 4.0 05/04/2022   CL 101 05/04/2022   CO2 28 05/04/2022

## 2022-05-21 NOTE — Assessment & Plan Note (Signed)
RESOLVED.  Lab Results  Component Value Date   NA 138 05/04/2022   K 4.0 05/04/2022   CL 101 05/04/2022   CO2 28 05/04/2022

## 2022-05-21 NOTE — Assessment & Plan Note (Signed)
Treat as viral infection with otc meds  COVID NEGATIVE HOME TEST

## 2022-05-22 LAB — RESPIRATORY VIRUS PANEL
Adenovirus B: NOT DETECTED
HUMAN PARAINFLU VIRUS 1: NOT DETECTED
HUMAN PARAINFLU VIRUS 2: NOT DETECTED
HUMAN PARAINFLU VIRUS 3: NOT DETECTED
INFLUENZA A SUBTYPE H1: NOT DETECTED
INFLUENZA A SUBTYPE H3: NOT DETECTED
Influenza A: NOT DETECTED
Influenza B: NOT DETECTED
Metapneumovirus: NOT DETECTED
Respiratory Syncytial Virus A: NOT DETECTED
Respiratory Syncytial Virus B: NOT DETECTED
Rhinovirus: DETECTED — AB

## 2022-05-26 ENCOUNTER — Other Ambulatory Visit: Payer: Self-pay

## 2022-05-26 NOTE — Telephone Encounter (Signed)
Filled by a different provider Refilled: 04/12/2022 Last OV: 05/20/2022 Next OV: not scheduled

## 2022-05-27 MED ORDER — PANTOPRAZOLE SODIUM 40 MG PO TBEC: 40.0000 mg | DELAYED_RELEASE_TABLET | Freq: Two times a day (BID) | ORAL | 2 refills | Status: DC

## 2022-05-29 ENCOUNTER — Other Ambulatory Visit: Payer: Self-pay | Admitting: Internal Medicine

## 2022-05-29 DIAGNOSIS — N3946 Mixed incontinence: Secondary | ICD-10-CM | POA: Diagnosis not present

## 2022-05-29 DIAGNOSIS — N952 Postmenopausal atrophic vaginitis: Secondary | ICD-10-CM | POA: Diagnosis not present

## 2022-05-29 DIAGNOSIS — K5902 Outlet dysfunction constipation: Secondary | ICD-10-CM | POA: Diagnosis not present

## 2022-05-29 DIAGNOSIS — N398 Other specified disorders of urinary system: Secondary | ICD-10-CM | POA: Diagnosis not present

## 2022-05-29 NOTE — Telephone Encounter (Signed)
Looks like medication was discontinued on 04/25/2022 at discharge.

## 2022-06-02 ENCOUNTER — Ambulatory Visit: Payer: BC Managed Care – PPO

## 2022-06-02 DIAGNOSIS — K265 Chronic or unspecified duodenal ulcer with perforation: Secondary | ICD-10-CM | POA: Diagnosis not present

## 2022-06-02 DIAGNOSIS — Z9889 Other specified postprocedural states: Secondary | ICD-10-CM | POA: Diagnosis not present

## 2022-06-02 DIAGNOSIS — K269 Duodenal ulcer, unspecified as acute or chronic, without hemorrhage or perforation: Secondary | ICD-10-CM | POA: Diagnosis not present

## 2022-06-06 ENCOUNTER — Other Ambulatory Visit: Payer: Self-pay | Admitting: Family

## 2022-06-08 ENCOUNTER — Other Ambulatory Visit: Payer: Self-pay

## 2022-06-08 ENCOUNTER — Encounter: Payer: Self-pay | Admitting: Internal Medicine

## 2022-06-08 MED ORDER — METOPROLOL TARTRATE 25 MG PO TABS: 25.0000 mg | ORAL_TABLET | Freq: Two times a day (BID) | ORAL | 0 refills | Status: DC

## 2022-06-14 ENCOUNTER — Encounter: Payer: Self-pay | Admitting: Internal Medicine

## 2022-06-16 MED ORDER — SERTRALINE HCL 100 MG PO TABS: 150.0000 mg | ORAL_TABLET | Freq: Every day | ORAL | 0 refills | Status: DC

## 2022-06-21 ENCOUNTER — Encounter: Payer: Self-pay | Admitting: Internal Medicine

## 2022-06-22 ENCOUNTER — Other Ambulatory Visit: Payer: Self-pay

## 2022-06-22 MED ORDER — BUSPIRONE HCL 15 MG PO TABS: 15.0000 mg | ORAL_TABLET | Freq: Two times a day (BID) | ORAL | 2 refills | Status: DC

## 2022-06-28 ENCOUNTER — Other Ambulatory Visit: Payer: Self-pay | Admitting: Family Medicine

## 2022-06-29 ENCOUNTER — Other Ambulatory Visit: Payer: Self-pay | Admitting: Family Medicine

## 2022-06-30 MED ORDER — AMPHETAMINE-DEXTROAMPHET ER 25 MG PO CP24: 25.0000 mg | ORAL_CAPSULE | Freq: Every day | ORAL | 0 refills | Status: DC

## 2022-07-12 ENCOUNTER — Other Ambulatory Visit: Payer: Self-pay | Admitting: Internal Medicine

## 2022-07-15 ENCOUNTER — Other Ambulatory Visit: Payer: Self-pay | Admitting: Internal Medicine

## 2022-07-17 ENCOUNTER — Ambulatory Visit: Payer: BC Managed Care – PPO | Admitting: Nurse Practitioner

## 2022-07-17 VITALS — BP 130/70 | HR 67 | Temp 97.9°F | Ht 68.0 in | Wt 156.6 lb

## 2022-07-17 DIAGNOSIS — J069 Acute upper respiratory infection, unspecified: Secondary | ICD-10-CM | POA: Diagnosis not present

## 2022-07-17 DIAGNOSIS — J029 Acute pharyngitis, unspecified: Secondary | ICD-10-CM

## 2022-07-17 MED ORDER — BENZONATATE 100 MG PO CAPS: 100.0000 mg | ORAL_CAPSULE | Freq: Three times a day (TID) | ORAL | 1 refills | Status: DC | PRN

## 2022-07-17 MED ORDER — AMOXICILLIN-POT CLAVULANATE 875-125 MG PO TABS: 1.0000 | ORAL_TABLET | Freq: Two times a day (BID) | ORAL | 0 refills | Status: DC

## 2022-07-17 NOTE — Progress Notes (Unsigned)
Established Patient Office Visit  Subjective:  Patient ID: Allison Alvarez, female    DOB: 04/05/1961  Age: 62 y.o. MRN: [id number]  CC:  Chief Complaint  Patient presents with   Sore Throat   Cough    CONGESTION, TESTED NEG FOR COVID TODAY    HPI  Allison Alvarez presents for sore throat and cough. She had  COVID 07/02/22 recently exposed to flu and strep.   URI  The maximum temperature recorded prior to her arrival was 100.4 - 100.9 F. Associated symptoms include congestion, coughing, diarrhea, headaches, a plugged ear sensation, a sore throat and swollen glands. Pertinent negatives include no chest pain, joint swelling or sinus pain. Associated symptoms comments: Chest tightness, body aches. She has tried increased fluids and decongestant for the symptoms.     Past Medical History:  Diagnosis Date   Allergy    Anxiety    Arthritis    Depression    Diverticulitis    Fibromyalgia    Hyperlipidemia    Hypertension    PONV (postoperative nausea and vomiting)    PVC (premature ventricular contraction)     Past Surgical History:  Procedure Laterality Date   ABDOMINAL HYSTERECTOMY  2007   APPENDECTOMY  2008   BLADDER REPAIR  2008   CHOLECYSTECTOMY     COLON RESECTION  11/2020   COLONOSCOPY     07/2016, 07/2020   ESOPHAGOGASTRODUODENOSCOPY  07/2020   TUBAL LIGATION  1996   VESICOVAGINAL FISTULA CLOSURE  2008   XI ROBOT ASSISTED DIAGNOSTIC LAPAROSCOPY N/A 04/08/2022   Procedure: XI ROBOT ASSISTED DUODENAL ULCER PERFORATION REPAIR;  Surgeon: Carolan Shiverintron-Diaz, Edgardo, MD;  Location: [facility];  Service: General;  Laterality: N/A;    Family History  Problem Relation Age of Onset   Hypertension Mother    Hyperlipidemia Mother    Depression Mother    Arthritis Mother    Mental illness Mother    Stroke Father    Early death Father    Depression Father    Arthritis Father    Mental illness Brother    Drug abuse Brother    Depression Brother    Cancer Brother        skin  cancer   Arthritis Brother    Anxiety disorder Daughter    Anxiety disorder Son    Breast cancer Paternal Aunt 40   Hypertension Maternal Grandmother    Cancer Maternal Grandmother    Arthritis Maternal Grandmother    Diabetes Maternal Grandfather    Depression Maternal Grandfather    Cancer Paternal Grandfather     Social History   Socioeconomic History   Marital status: Married    Spouse name: Thayer OhmChris   Number of children: 2   Years of education: Not on file   Highest education level: Bachelor's degree (e.g., BA, AB, BS)  Occupational History   Not on file  Tobacco Use   Smoking status: Former    Packs/day: 1    Types: Cigarettes    Quit date: 01/2020    Years since quitting: 2.5   Smokeless tobacco: Never  Vaping Use   Vaping Use: Every day   Substances: Nicotine, Flavoring  Substance and Sexual Activity   Alcohol use: Yes    Alcohol/week: 14.0 standard drinks of alcohol    Types: 14 Glasses of wine per week    Comment: daily wine   Drug use: Never   Sexual activity: Yes  Other Topics Concern   Not on file  Social History  Narrative   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 [employer] Strain: Medium Risk (07/17/2022)   Overall Financial Resource Strain (CARDIA)    Difficulty of Paying Living Expenses: Somewhat hard  Food Insecurity: Food Insecurity Present (07/17/2022)   Hunger Vital Sign    Worried About Running Out of Food in the Last Year: Sometimes true    Ran Out of Food in the Last Year: Never true  Transportation Needs: No Transportation Needs (07/17/2022)   PRAPARE - Administrator, Civil Service (Medical): No    Lack of Transportation (Non-Medical): No  Physical Activity: Unknown (07/17/2022)   Exercise Vital Sign    Days of Exercise per Week: 0 days    Minutes of Exercise per Session: Not on file  Stress: Stress Concern Present (07/17/2022)   [employer] of Occupational Health - Occupational Stress  Questionnaire    Feeling of Stress : To some extent  Social Connections: Moderately Integrated (07/17/2022)   Social Connection and Isolation Panel [NHANES]    Frequency of Communication with Friends and Family: More than three times a week    Frequency of Social Gatherings with Friends and Family: More than three times a week    Attends Religious Services: 1 to 4 times per year    Active Member of [employer] or Organizations: No    Attends Banker Meetings: Not on file    Marital Status: Married  Catering manager Violence: Not At Risk (04/24/2022)   Humiliation, Afraid, Rape, and Kick questionnaire    Fear of Current or Ex-Partner: No    Emotionally Abused: No    Physically Abused: No    Sexually Abused: No     Outpatient Medications Prior to Visit  Medication Sig Dispense Refill   acetaminophen (TYLENOL) 325 MG tablet Take 650 mg by mouth every 6 (six) hours as needed for moderate pain.     ALPRAZolam (XANAX) 0.5 MG tablet Take 1 tablet (0.5 mg total) by mouth at bedtime as needed for anxiety. 30 tablet 2   amLODipine (NORVASC) 2.5 MG tablet TAKE 1 TABLET BY MOUTH TWICE A DAY 180 tablet 1   amphetamine-dextroamphetamine (ADDERALL XR) 25 MG 24 hr capsule Take 1 capsule by mouth daily. 90 capsule 0   busPIRone (BUSPAR) 15 MG tablet Take 1 tablet (15 mg total) by mouth 2 (two) times daily. 60 tablet 2   cyanocobalamin (VITAMIN B12) 1000 MCG/ML injection Inject 1 mL (1,000 mcg total) into the muscle every 30 (thirty) days. 1 mL 11   gabapentin (NEURONTIN) 100 MG capsule TAKE 1 CAPSULE BY MOUTH TWICE A DAY 60 capsule 1   gabapentin (NEURONTIN) 300 MG capsule Take 1 capsule (300 mg total) by mouth every evening. 30 capsule 3   Iron, Ferrous Sulfate, 325 (65 Fe) MG TABS Take 325 mg by mouth every other day. Take with orange juice or food 45 tablet 0   metoprolol tartrate (LOPRESSOR) 25 MG tablet Take 1 tablet (25 mg total) by mouth 2 (two) times daily. 90 tablet 0   pantoprazole  (PROTONIX) 40 MG tablet Take 1 tablet (40 mg total) by mouth 2 (two) times daily. 60 tablet 2   sertraline (ZOLOFT) 100 MG tablet TAKE 1 AND 1/2 TABLET BY MOUTH DAILY 45 tablet 5   No facility-administered medications prior to visit.    Allergies  Allergen Reactions   Morphine Itching   Hydromorphone Itching    ROS Review of  Systems  Constitutional:        Body aches.  HENT:  Positive for congestion and sore throat. Negative for sinus pain.        Ear fullness  Respiratory:  Positive for cough.   Cardiovascular:  Negative for chest pain.  Gastrointestinal:  Positive for diarrhea.  Neurological:  Positive for headaches.  Psychiatric/Behavioral: Negative.        Objective:    Physical Exam Constitutional:      Appearance: She is well-developed.  HENT:     Right Ear: Tympanic membrane normal.     Left Ear: Tympanic membrane normal.     Nose: Nose normal.     Right Turbinates: Not enlarged.     Left Turbinates: Not enlarged.     Right Sinus: No maxillary sinus tenderness or frontal sinus tenderness.     Left Sinus: No maxillary sinus tenderness or frontal sinus tenderness.     Comments: Right submandibular lymphadenopathy    Mouth/Throat:     Mouth: Mucous membranes are moist.     Pharynx: Oropharynx is clear. Posterior oropharyngeal erythema present. No oropharyngeal exudate.  Cardiovascular:     Rate and Rhythm: Normal rate and regular rhythm.     Pulses: Normal pulses.     Heart sounds: Normal heart sounds.  Pulmonary:     Effort: Pulmonary effort is normal.     Breath sounds: Normal breath sounds. No stridor. No wheezing.  Neurological:     General: No focal deficit present.     Mental Status: She is alert and oriented to person, place, and time. Mental status is at baseline.  Psychiatric:        Mood and Affect: Mood normal.        Behavior: Behavior normal.        Thought Content: Thought content normal.        Judgment: Judgment normal.     BP 130/70    Pulse 67   Temp 97.9 F (36.6 C) (Oral)   Ht 5\' 8"  (1.727 m)   Wt 156 lb 9.6 oz (71 kg)   SpO2 97%   BMI 23.81 kg/m  Wt Readings from Last 3 Encounters:  07/22/22 157 lb 3.2 oz (71.3 kg)  07/17/22 156 lb 9.6 oz (71 kg)  05/20/22 162 lb (73.5 kg)     Health Maintenance  Topic Date Due   Hepatitis C Screening  Never done   PAP SMEAR-Modifier  Never done   COLONOSCOPY (Pts 45-6294yrs Insurance coverage will need to be confirmed)  Never done   Lung Cancer Screening  Never done   COVID-19 Vaccine (5 - 2023-24 season) 12/12/2021   INFLUENZA VACCINE  11/12/2022   MAMMOGRAM  02/15/2023   DTaP/Tdap/Td (3 - Td or Tdap) 08/31/2030   HIV Screening  Completed   Zoster Vaccines- Shingrix  Completed   HPV VACCINES  Aged Out    There are no preventive care reminders to display for this patient.  Lab Results  Component Value Date   TSH 1.03 09/17/2021   Lab Results  Component Value Date   WBC 6.5 05/04/2022   HGB 9.9 (L) 05/04/2022   HCT 29.6 (L) 05/04/2022   MCV 96.0 05/04/2022   PLT 413.0 (H) 05/04/2022   Lab Results  Component Value Date   NA 138 05/04/2022   K 4.0 05/04/2022   CO2 28 05/04/2022   GLUCOSE 94 05/04/2022   BUN 10 05/04/2022   CREATININE 0.80 05/04/2022   BILITOT 1.2 04/23/2022  ALKPHOS 101 04/23/2022   AST 22 04/23/2022   ALT 18 04/23/2022   PROT 7.8 04/23/2022   ALBUMIN 3.5 04/23/2022   CALCIUM 9.3 05/04/2022   ANIONGAP 9 04/24/2022   GFR 80.34 05/04/2022   Lab Results  Component Value Date   CHOL 208 (H) 09/17/2021   Lab Results  Component Value Date   HDL 53.00 09/17/2021   Lab Results  Component Value Date   LDLCALC 123 (H) 09/17/2021   Lab Results  Component Value Date   TRIG 158.0 (H) 09/17/2021   Lab Results  Component Value Date   CHOLHDL 4 09/17/2021   Lab Results  Component Value Date   HGBA1C 5.5 09/17/2021      Assessment & Plan:  Upper respiratory tract infection, unspecified type Assessment & Plan: POCT strep and  flu negative Rx Augmentin and Tessalon Perles sent to the pharmacy. Advised patient to increase hydration and use salt water gargles. If symptoms not improving call the office for further evaluation.   Sore throat -     POCT Influenza A/B -     POCT rapid strep A  Other orders -     Amoxicillin-Pot Clavulanate; Take 1 tablet by mouth 2 (two) times daily.  Dispense: 20 tablet; Refill: 0 -     Benzonatate; Take 1 capsule (100 mg total) by mouth 3 (three) times daily as needed for cough.  Dispense: 30 capsule; Refill: 1    Follow-up: No follow-ups on file.   Kara Dies, NP

## 2022-07-17 NOTE — Patient Instructions (Signed)
Augmentin and Tessalon Perles sent to the pharmacy. Take plain Mucinex over-the-counter. Increase hydration. Use salt water gargles. If symptoms not improve please call the office back for further evaluation.

## 2022-07-21 ENCOUNTER — Encounter: Payer: Self-pay | Admitting: Internal Medicine

## 2022-07-22 ENCOUNTER — Encounter: Payer: Self-pay | Admitting: Nurse Practitioner

## 2022-07-22 ENCOUNTER — Ambulatory Visit: Payer: BC Managed Care – PPO | Admitting: Nurse Practitioner

## 2022-07-22 VITALS — BP 140/86 | HR 77 | Temp 98.6°F | Ht 68.0 in | Wt 157.2 lb

## 2022-07-22 DIAGNOSIS — Z8719 Personal history of other diseases of the digestive system: Secondary | ICD-10-CM

## 2022-07-22 DIAGNOSIS — K279 Peptic ulcer, site unspecified, unspecified as acute or chronic, without hemorrhage or perforation: Secondary | ICD-10-CM

## 2022-07-22 DIAGNOSIS — R1011 Right upper quadrant pain: Secondary | ICD-10-CM | POA: Diagnosis not present

## 2022-07-22 NOTE — Progress Notes (Signed)
Allison Dicker, NP-C Phone: [phone]  Allison Alvarez is a 62 y.o. female who presents today for abdominal pain.   Abdominal Pain  She reports new onset abdominal pain. The most recent episode started  3 days ago and is worsening. The abdominal pain is located in the right upper quadrant and epigastrium and does not radiate. It is described as aching and cramping, is 5/10 in intensity, occurring constantly. It is aggravated by coughing and moving and is relieved by nothing. She has tried acetaminophen with no relief.  Associated symptoms: No anorexia  No belching  No bloody stool No blood in urine   No constipation No diarrhea  No dysuria No fever  No flatus No headaches  No headaches No joint pains  No myalgias No nausea  No vomiting No weight loss     Recent GI studies:upper GI endoscopy, CT of abdomen, and CT of pelvis Relevant medical history includes: appendectomy, cholecystectomy, diverticulitis, peptic ulcer disease, and perforated duodenum.  Previous labs Lab Results  Component Value Date   WBC 6.5 05/04/2022   HGB 9.9 (L) 05/04/2022   HCT 29.6 (L) 05/04/2022   MCV 96.0 05/04/2022   MCH 32.0 04/24/2022   RDW 13.4 05/04/2022   PLT 413.0 (H) 05/04/2022   Lab Results  Component Value Date   GLUCOSE 94 05/04/2022   NA 138 05/04/2022   K 4.0 05/04/2022   CL 101 05/04/2022   CO2 28 05/04/2022   BUN 10 05/04/2022   CREATININE 0.80 05/04/2022   GFRNONAA >60 04/24/2022   GFRAA >60 05/27/2017   CALCIUM 9.3 05/04/2022   PHOS 4.3 02/18/2017   PROT 7.8 04/23/2022   ALBUMIN 3.5 04/23/2022   LABGLOB 2.6 02/18/2017   AGRATIO 1.7 02/18/2017   BILITOT 1.2 04/23/2022   ALKPHOS 101 04/23/2022   AST 22 04/23/2022   ALT 18 04/23/2022   ANIONGAP 9 04/24/2022   -----------------------------------------------------------------------------------------   Social History   Tobacco Use  Smoking Status Former   Packs/day: 1   Types: Cigarettes   Quit date: 01/2020   Years  since quitting: 2.5  Smokeless Tobacco Never    Current Outpatient Medications on File Prior to Visit  Medication Sig Dispense Refill   acetaminophen (TYLENOL) 325 MG tablet Take 650 mg by mouth every 6 (six) hours as needed for moderate pain.     ALPRAZolam (XANAX) 0.5 MG tablet Take 1 tablet (0.5 mg total) by mouth at bedtime as needed for anxiety. 30 tablet 2   amLODipine (NORVASC) 2.5 MG tablet TAKE 1 TABLET BY MOUTH TWICE A DAY 180 tablet 1   amoxicillin-clavulanate (AUGMENTIN) 875-125 MG tablet Take 1 tablet by mouth 2 (two) times daily. 20 tablet 0   amphetamine-dextroamphetamine (ADDERALL XR) 25 MG 24 hr capsule Take 1 capsule by mouth daily. 90 capsule 0   benzonatate (TESSALON PERLES) 100 MG capsule Take 1 capsule (100 mg total) by mouth 3 (three) times daily as needed for cough. 30 capsule 1   busPIRone (BUSPAR) 15 MG tablet Take 1 tablet (15 mg total) by mouth 2 (two) times daily. 60 tablet 2   cyanocobalamin (VITAMIN B12) 1000 MCG/ML injection Inject 1 mL (1,000 mcg total) into the muscle every 30 (thirty) days. 1 mL 11   gabapentin (NEURONTIN) 100 MG capsule TAKE 1 CAPSULE BY MOUTH TWICE A DAY 60 capsule 1   gabapentin (NEURONTIN) 300 MG capsule Take 1 capsule (300 mg total) by mouth every evening. 30 capsule 3   Iron, Ferrous Sulfate, 325 (65 Fe)  MG TABS Take 325 mg by mouth every other day. Take with orange juice or food 45 tablet 0   metoprolol tartrate (LOPRESSOR) 25 MG tablet Take 1 tablet (25 mg total) by mouth 2 (two) times daily. 90 tablet 0   pantoprazole (PROTONIX) 40 MG tablet Take 1 tablet (40 mg total) by mouth 2 (two) times daily. 60 tablet 2   sertraline (ZOLOFT) 100 MG tablet TAKE 1 AND 1/2 TABLET BY MOUTH DAILY 45 tablet 5   No current facility-administered medications on file prior to visit.    ROS see history of present illness  Objective  Physical Exam Vitals:   07/22/22 1613 07/22/22 1636  BP: (!) 138/90 (!) 140/86  Pulse: 77   Temp: 98.6 F (37 C)    SpO2: 96%     BP Readings from Last 3 Encounters:  07/22/22 (!) 140/86  07/17/22 130/70  05/20/22 118/78   Wt Readings from Last 3 Encounters:  07/22/22 157 lb 3.2 oz (71.3 kg)  07/17/22 156 lb 9.6 oz (71 kg)  05/20/22 162 lb (73.5 kg)    Physical Exam Constitutional:      General: She is not in acute distress.    Appearance: Normal appearance.  HENT:     Head: Normocephalic.  Cardiovascular:     Rate and Rhythm: Normal rate and regular rhythm.     Heart sounds: Normal heart sounds.  Pulmonary:     Effort: Pulmonary effort is normal.     Breath sounds: Normal breath sounds.  Abdominal:     General: Abdomen is flat. Bowel sounds are normal. There is no distension.     Palpations: Abdomen is soft.     Tenderness: There is abdominal tenderness in the right upper quadrant and epigastric area. There is no guarding.  Skin:    General: Skin is warm and dry.  Neurological:     General: No focal deficit present.     Mental Status: She is alert.  Psychiatric:        Mood and Affect: Mood normal.        Behavior: Behavior normal.    Assessment/Plan: Please see individual problem list.  Right upper quadrant abdominal pain Assessment & Plan: Lab work as outlined. Will get new CT Abd/Pelvis with contrast for further evaluation due to significant GI history. RUQ tender to palpation on exam. Strict return precautions given to patient.   Orders: -     Basic metabolic panel -     Hepatic function panel -     Lipase -     CBC with Differential/Platelet -     CT ABDOMEN PELVIS W CONTRAST; Future  Peptic ulcer -     CT ABDOMEN PELVIS W CONTRAST; Future  Hx of diverticulitis of colon -     CT ABDOMEN PELVIS W CONTRAST; Future    Return if symptoms worsen or fail to improve.   Allison Dicker, NP-C [facility] [employer]

## 2022-07-22 NOTE — Assessment & Plan Note (Addendum)
Lab work as outlined. Will get new CT Abd/Pelvis with contrast for further evaluation due to significant GI history. RUQ tender to palpation on exam. Strict return precautions given to patient.

## 2022-07-23 ENCOUNTER — Other Ambulatory Visit: Payer: Self-pay

## 2022-07-23 ENCOUNTER — Other Ambulatory Visit: Payer: Self-pay | Admitting: Nurse Practitioner

## 2022-07-23 ENCOUNTER — Emergency Department: Payer: BC Managed Care – PPO

## 2022-07-23 ENCOUNTER — Encounter: Payer: Self-pay | Admitting: Nurse Practitioner

## 2022-07-23 ENCOUNTER — Emergency Department
Admission: EM | Admit: 2022-07-23 | Discharge: 2022-07-24 | Disposition: A | Discharge status: Home / Self Care | Payer: BC Managed Care – PPO | Attending: Emergency Medicine | Admitting: Emergency Medicine

## 2022-07-23 DIAGNOSIS — Z8719 Personal history of other diseases of the digestive system: Secondary | ICD-10-CM

## 2022-07-23 DIAGNOSIS — J069 Acute upper respiratory infection, unspecified: Secondary | ICD-10-CM | POA: Insufficient documentation

## 2022-07-23 DIAGNOSIS — E876 Hypokalemia: Secondary | ICD-10-CM | POA: Insufficient documentation

## 2022-07-23 DIAGNOSIS — I700 Atherosclerosis of aorta: Secondary | ICD-10-CM | POA: Diagnosis not present

## 2022-07-23 DIAGNOSIS — R110 Nausea: Secondary | ICD-10-CM | POA: Insufficient documentation

## 2022-07-23 DIAGNOSIS — R059 Cough, unspecified: Secondary | ICD-10-CM | POA: Diagnosis not present

## 2022-07-23 DIAGNOSIS — R1011 Right upper quadrant pain: Secondary | ICD-10-CM | POA: Insufficient documentation

## 2022-07-23 DIAGNOSIS — R079 Chest pain, unspecified: Secondary | ICD-10-CM | POA: Diagnosis not present

## 2022-07-23 DIAGNOSIS — K760 Fatty (change of) liver, not elsewhere classified: Secondary | ICD-10-CM | POA: Diagnosis not present

## 2022-07-23 DIAGNOSIS — R051 Acute cough: Secondary | ICD-10-CM | POA: Diagnosis not present

## 2022-07-23 DIAGNOSIS — R1031 Right lower quadrant pain: Secondary | ICD-10-CM | POA: Insufficient documentation

## 2022-07-23 DIAGNOSIS — R109 Unspecified abdominal pain: Secondary | ICD-10-CM

## 2022-07-23 LAB — CBC
HCT: 32.5 % — ABNORMAL LOW (ref 36.0–46.0)
Hemoglobin: 10.8 g/dL — ABNORMAL LOW (ref 12.0–15.0)
MCH: 31.2 pg (ref 26.0–34.0)
MCHC: 33.2 g/dL (ref 30.0–36.0)
MCV: 93.9 fL (ref 80.0–100.0)
Platelets: 287 10*3/uL (ref 150–400)
RBC: 3.46 MIL/uL — ABNORMAL LOW (ref 3.87–5.11)
RDW: 12.7 % (ref 11.5–15.5)
WBC: 7.7 10*3/uL (ref 4.0–10.5)
nRBC: 0 % (ref 0.0–0.2)

## 2022-07-23 LAB — COMPREHENSIVE METABOLIC PANEL
ALT: 13 U/L (ref 0–44)
AST: 15 U/L (ref 15–41)
Albumin: 4 g/dL (ref 3.5–5.0)
Alkaline Phosphatase: 64 U/L (ref 38–126)
Anion gap: 9 (ref 5–15)
BUN: 11 mg/dL (ref 6–20)
CO2: 25 mmol/L (ref 22–32)
Calcium: 8.8 mg/dL — ABNORMAL LOW (ref 8.9–10.3)
Chloride: 100 mmol/L (ref 98–111)
Creatinine, Ser: 0.79 mg/dL (ref 0.44–1.00)
GFR, Estimated: >60 mL/min (ref >60)
Glucose, Bld: 101 mg/dL — ABNORMAL HIGH (ref 70–99)
Potassium: 3.1 mmol/L — ABNORMAL LOW (ref 3.5–5.1)
Sodium: 134 mmol/L — ABNORMAL LOW (ref 135–145)
Total Bilirubin: 0.5 mg/dL (ref 0.3–1.2)
Total Protein: 7.1 g/dL (ref 6.5–8.1)

## 2022-07-23 LAB — POCT INFLUENZA A/B
Influenza A, POC: NEGATIVE
Influenza B, POC: NEGATIVE

## 2022-07-23 LAB — BASIC METABOLIC PANEL
BUN: 9 mg/dL (ref 6–23)
CO2: 29 mEq/L (ref 19–32)
Calcium: 9.4 mg/dL (ref 8.4–10.5)
Chloride: 103 mEq/L (ref 96–112)
Creatinine, Ser: 0.83 mg/dL (ref 0.40–1.20)
GFR: 76.75 mL/min (ref >60.00)
Glucose, Bld: 100 mg/dL — ABNORMAL HIGH (ref 70–99)
Potassium: 3.9 mEq/L (ref 3.5–5.1)
Sodium: 139 mEq/L (ref 135–145)

## 2022-07-23 LAB — HEPATIC FUNCTION PANEL
ALT: 11 U/L (ref 0–35)
AST: 13 U/L (ref 0–37)
Albumin: 4.2 g/dL (ref 3.5–5.2)
Alkaline Phosphatase: 67 U/L (ref 39–117)
Bilirubin, Direct: 0 mg/dL (ref 0.0–0.3)
Total Bilirubin: 0.2 mg/dL (ref 0.2–1.2)
Total Protein: 7.3 g/dL (ref 6.0–8.3)

## 2022-07-23 LAB — POCT RAPID STREP A (OFFICE): Rapid Strep A Screen: NEGATIVE

## 2022-07-23 LAB — CBC WITH DIFFERENTIAL/PLATELET
Basophils Absolute: 0.1 10*3/uL (ref 0.0–0.1)
Basophils Relative: 1.3 % (ref 0.0–3.0)
Eosinophils Absolute: 0.2 10*3/uL (ref 0.0–0.7)
Eosinophils Relative: 2.8 % (ref 0.0–5.0)
HCT: 34.4 % — ABNORMAL LOW (ref 36.0–46.0)
Hemoglobin: 11.7 g/dL — ABNORMAL LOW (ref 12.0–15.0)
Lymphocytes Relative: 29.3 % (ref 12.0–46.0)
Lymphs Abs: 2.2 10*3/uL (ref 0.7–4.0)
MCHC: 34 g/dL (ref 30.0–36.0)
MCV: 94.1 fl (ref 78.0–100.0)
Monocytes Absolute: 0.4 10*3/uL (ref 0.1–1.0)
Monocytes Relative: 5.5 % (ref 3.0–12.0)
Neutro Abs: 4.6 10*3/uL (ref 1.4–7.7)
Neutrophils Relative %: 61.1 % (ref 43.0–77.0)
Platelets: 305 10*3/uL (ref 150.0–400.0)
RBC: 3.66 Mil/uL — ABNORMAL LOW (ref 3.87–5.11)
RDW: 13.3 % (ref 11.5–15.5)
WBC: 7.5 10*3/uL (ref 4.0–10.5)

## 2022-07-23 LAB — LIPASE: Lipase: 33 U/L (ref 11.0–59.0)

## 2022-07-23 LAB — LIPASE, BLOOD: Lipase: 38 U/L (ref 11–51)

## 2022-07-23 MED ORDER — POTASSIUM CHLORIDE 10 MEQ/100ML IV SOLN
10.0000 meq | Freq: Once | INTRAVENOUS | Status: AC
  Administered 2022-07-24: 10 meq via INTRAVENOUS
  Filled 2022-07-23: qty 100

## 2022-07-23 MED ORDER — KETOROLAC TROMETHAMINE 30 MG/ML IJ SOLN
15.0000 mg | Freq: Once | INTRAMUSCULAR | Status: AC
  Administered 2022-07-23: 15 mg via INTRAVENOUS
  Filled 2022-07-23: qty 1

## 2022-07-23 MED ORDER — CIPROFLOXACIN HCL 500 MG PO TABS
500.0000 mg | ORAL_TABLET | Freq: Two times a day (BID) | ORAL | 0 refills | Status: AC
Start: 2022-07-23 — End: 2022-07-30

## 2022-07-23 MED ORDER — METHYLPREDNISOLONE SODIUM SUCC 125 MG IJ SOLR
80.0000 mg | Freq: Once | INTRAMUSCULAR | Status: AC
  Administered 2022-07-24: 80 mg via INTRAVENOUS
  Filled 2022-07-23: qty 2

## 2022-07-23 MED ORDER — DIPHENHYDRAMINE HCL 50 MG/ML IJ SOLN
25.0000 mg | Freq: Once | INTRAMUSCULAR | Status: AC
  Administered 2022-07-23: 25 mg via INTRAVENOUS
  Filled 2022-07-23: qty 1

## 2022-07-23 MED ORDER — ONDANSETRON 4 MG PO TBDP: 4.0000 mg | ORAL_TABLET | Freq: Three times a day (TID) | ORAL | 0 refills | Status: AC | PRN

## 2022-07-23 MED ORDER — ONDANSETRON HCL 4 MG/2ML IJ SOLN
4.0000 mg | Freq: Once | INTRAMUSCULAR | Status: AC
  Administered 2022-07-23: 4 mg via INTRAVENOUS
  Filled 2022-07-23: qty 2

## 2022-07-23 MED ORDER — METRONIDAZOLE 500 MG PO TABS
500.0000 mg | ORAL_TABLET | Freq: Two times a day (BID) | ORAL | 0 refills | Status: AC
Start: 2022-07-23 — End: 2022-07-30

## 2022-07-23 MED ORDER — OXYCODONE-ACETAMINOPHEN 5-325 MG PO TABS
1.0000 | ORAL_TABLET | Freq: Once | ORAL | Status: AC
  Administered 2022-07-24: 1 via ORAL
  Filled 2022-07-23: qty 1

## 2022-07-23 MED ORDER — ONDANSETRON HCL 4 MG/2ML IJ SOLN
4.0000 mg | Freq: Once | INTRAMUSCULAR | Status: AC
  Administered 2022-07-24: 4 mg via INTRAVENOUS
  Filled 2022-07-23: qty 2

## 2022-07-23 MED ORDER — OXYCODONE-ACETAMINOPHEN 5-325 MG PO TABS: 1.0000 | ORAL_TABLET | Freq: Four times a day (QID) | ORAL | 0 refills | Status: DC | PRN

## 2022-07-23 MED ORDER — PREDNISONE 20 MG PO TABS: 40.0000 mg | ORAL_TABLET | Freq: Every day | ORAL | 0 refills | Status: AC

## 2022-07-23 MED ORDER — IOHEXOL 300 MG/ML  SOLN
100.0000 mL | Freq: Once | INTRAMUSCULAR | Status: AC | PRN
  Administered 2022-07-23: 100 mL via INTRAVENOUS

## 2022-07-23 MED ORDER — SODIUM CHLORIDE 0.9 % IV BOLUS
1000.0000 mL | Freq: Once | INTRAVENOUS | Status: AC
  Administered 2022-07-23: 1000 mL via INTRAVENOUS

## 2022-07-23 MED ORDER — HYDROMORPHONE HCL 1 MG/ML IJ SOLN
0.5000 mg | Freq: Once | INTRAMUSCULAR | Status: AC
  Administered 2022-07-23: 0.5 mg via INTRAVENOUS
  Filled 2022-07-23: qty 0.5

## 2022-07-23 MED ORDER — FAMOTIDINE 20 MG PO TABS: 20.0000 mg | ORAL_TABLET | Freq: Every day | ORAL | 0 refills | Status: DC

## 2022-07-23 NOTE — Assessment & Plan Note (Signed)
POCT strep and flu negative Rx Augmentin and Tessalon Perles sent to the pharmacy. Advised patient to increase hydration and use salt water gargles. If symptoms not improving call the office for further evaluation.

## 2022-07-23 NOTE — Discharge Instructions (Addendum)
For your pain:  Take the steroids as prescribed, with food in the mornings. In addition to your protonix, take Pepcid 20 mg to help protect your stomach.  Take the rest of your Augmentin as prescribed.  Take the Oxycodone for severe pain.  Take Zofran for nausea as needed.

## 2022-07-23 NOTE — ED Triage Notes (Signed)
Pt to ED for mid right abd pain started 3 days ago. States abd feels swollen. +nausea.  Hx diverticulitis.

## 2022-07-23 NOTE — ED Provider Notes (Signed)
[facility] Provider Note    Event Date/Time   First MD Initiated Contact with Patient 07/23/22 2124     (approximate)   History   Abdominal Pain   HPI  Allison Alvarez is a 62 y.o. female  with h/o duodenal ulcer, pelvic abscess, s/p chole and appendectomy, here with R sided abd pain. Pt reports that she has had aching, throbbing pain in her right abdomen that has moved toward the middle. It is worse with movements, deep breathing. She has had a cough, sputum production and was treated for bronchitis and is on day 8 of Augmentin. She has some nausea but no vomiting. No diarrhea or constipation.        Physical Exam   Triage Vital Signs: ED Triage Vitals  Enc Vitals Group     BP 07/23/22 1751 (!) 194/103     Pulse Rate 07/23/22 1751 79     Resp 07/23/22 1751 18     Temp 07/23/22 1751 98.7 F (37.1 C)     Temp Source 07/23/22 1751 Oral     SpO2 07/23/22 1751 96 %     Weight 07/23/22 1753 156 lb (70.8 kg)     Height 07/23/22 1753 5\' 8"  (1.727 m)     Head Circumference --      Peak Flow --      Pain Score 07/23/22 1753 6     Pain Loc --      Pain Edu? --      Excl. in GC? --     Most recent vital signs: Vitals:   07/23/22 1751 07/23/22 2323  BP: (!) 194/103 (!) 172/95  Pulse: 79 79  Resp: 18 18  Temp: 98.7 F (37.1 C)   SpO2: 96% 100%     General: Awake, no distress.  CV:  Good peripheral perfusion. RRR. Resp:  Normal work of breathing. Lungs clear bilaterally. Abd:  No distention. Mild TTP in RUQ/right lower costal margin. No peritonitis. Other:  No edema.   ED Results / Procedures / Treatments   Labs (all labs ordered are listed, but only abnormal results are displayed) Labs Reviewed  COMPREHENSIVE METABOLIC PANEL - Abnormal; Notable for the following components:      Result Value   Sodium 134 (*)    Potassium 3.1 (*)    Glucose, Bld 101 (*)    Calcium 8.8 (*)    All other components within normal limits  CBC - Abnormal;  Notable for the following components:   RBC 3.46 (*)    Hemoglobin 10.8 (*)    HCT 32.5 (*)    All other components within normal limits  LIPASE, BLOOD  URINALYSIS, ROUTINE W REFLEX MICROSCOPIC     EKG    RADIOLOGY CT A/P: Prior surgeries, no acute abnormality   I also independently reviewed and agree with radiologist interpretations.   PROCEDURES:  Critical Care performed: No   MEDICATIONS ORDERED IN ED: Medications  ondansetron (ZOFRAN) injection 4 mg (has no administration in time range)  ondansetron (ZOFRAN) injection 4 mg (has no administration in time range)  potassium chloride 10 mEq in 100 mL IVPB (has no administration in time range)  sodium chloride 0.9 % bolus 1,000 mL (1,000 mLs Intravenous New Bag/Given 07/23/22 2215)  ondansetron (ZOFRAN) injection 4 mg (4 mg Intravenous Given 07/23/22 2211)  HYDROmorphone (DILAUDID) injection 0.5 mg (0.5 mg Intravenous Given 07/23/22 2230)  ketorolac (TORADOL) 30 MG/ML injection 15 mg (15 mg Intravenous Given 07/23/22 2212)  diphenhydrAMINE (BENADRYL) injection 25 mg (25 mg Intravenous Given 07/23/22 2218)  iohexol (OMNIPAQUE) 300 MG/ML solution 100 mL (100 mLs Intravenous Contrast Given 07/23/22 2252)     IMPRESSION / MDM / ASSESSMENT AND PLAN / ED COURSE  I reviewed the triage vital signs and the nursing notes.                              Differential diagnosis includes, but is not limited to, hepatitis, colitis, diverticulitis, obstruction, PNA, MSK/chest wall pain, thoracic radiculopathy.  Patient's presentation is most consistent with acute presentation with potential threat to life or bodily function.  62 yo F here with right sided upper abdominal pain. Suspect pain 2/2 recent cough/URI with costochondritis/MSK pain, though she has an extensive intra-abd history so broad work-up obtained. CT scan shows no acute abnormality. CBC without leukocytosis. LFTs, bili, renal function normal. Mild hypoK noted - will replete.  Lipase normal. UA pending.  Pt has no hypoxia, tachycardia, tachypnea or signs to suggest PE. CLinically, suspect she had a PNA which caused some MSK pain from coughing. She cannot take nsaids 2/2 h/o PUD. Will give steroids, analgesia and plan to d/c if UA is unremarkable.     FINAL CLINICAL IMPRESSION(S) / ED DIAGNOSES   Final diagnoses:  Right sided abdominal pain  Acute cough     Rx / DC Orders   ED Discharge Orders     None        Note:  This document was prepared using Dragon voice recognition software and may include unintentional dictation errors.   Shaune Pollack, MD 07/23/22 2348

## 2022-07-24 ENCOUNTER — Telehealth: Payer: Self-pay

## 2022-07-24 LAB — URINALYSIS, ROUTINE W REFLEX MICROSCOPIC
Bacteria, UA: NONE SEEN
Bilirubin Urine: NEGATIVE
Glucose, UA: NEGATIVE mg/dL
Ketones, ur: NEGATIVE mg/dL
Leukocytes,Ua: NEGATIVE
Nitrite: NEGATIVE
Protein, ur: NEGATIVE mg/dL
Specific Gravity, Urine: 1.027 (ref 1.005–1.030)
pH: 6 (ref 5.0–8.0)

## 2022-07-24 MED ORDER — FLUCONAZOLE 150 MG PO TABS: 150.0000 mg | ORAL_TABLET | Freq: Once | ORAL | 0 refills | Status: AC

## 2022-07-24 NOTE — Transitions of Care (Post Inpatient/ED Visit) (Signed)
Transition Care Management Unsuccessful Follow-up Telephone Call  Date of discharge and from where:  07/23/22  Attempts:  1st Attempt  Reason for unsuccessful TCM follow-up call:  Unable to reach patient Valentino Nose, RN

## 2022-07-26 ENCOUNTER — Other Ambulatory Visit: Payer: Self-pay | Admitting: Internal Medicine

## 2022-07-27 NOTE — Transitions of Care (Post Inpatient/ED Visit) (Signed)
   07/27/2022  Name: Chatoya Stenger MRN: [id number] DOB: [birth year]  Today's TOC FU Call Status: Today's TOC FU Call Status:: Successful TOC FU Call Competed Unsuccessful Call (1st Attempt) Date: 07/24/22 [facility] FU Call Complete Date: 07/27/22  Transition Care Management Follow-up Telephone Call Date of Discharge: 07/24/22 Discharge Facility: [facility]) Type of Discharge: Emergency Department Reason for ED Visit: Other: (Pneumonia) How have you been since you were released from the hospital?: Better Any questions or concerns?: Yes Patient Questions/Concerns:: BP was up while in ED.  Plans to take BP and home and call us if it continues to be elevated.  Items Reviewed: Did you receive and understand the discharge instructions provided?: Yes Medications obtained and verified?: Yes (Medications Reviewed) Any new allergies since your discharge?: No Dietary orders reviewed?: NA Do you have support at home?: Yes People in Home: spouse Name of Support/Comfort Primary Source: [facility] and Equipment/Supplies: Were Home Health Services Ordered?: No Any new equipment or medical supplies ordered?: No  Functional Questionnaire: Do you need assistance with bathing/showering or dressing?: No Do you need assistance with meal preparation?: No Do you need assistance with eating?: No Do you have difficulty maintaining continence: No Do you need assistance with getting out of bed/getting out of a chair/moving?: No Do you have difficulty managing or taking your medications?: No  Follow up appointments reviewed: PCP Follow-up appointment confirmed?: NA Specialist Hospital Follow-up appointment confirmed?: NA Do you need transportation to your follow-up appointment?: No Do you understand care options if your condition(s) worsen?: Yes-patient verbalized understanding    SIGNATURE Valentino Nose, RN

## 2022-07-30 ENCOUNTER — Ambulatory Visit: Payer: BC Managed Care – PPO

## 2022-08-11 ENCOUNTER — Encounter: Payer: Self-pay | Admitting: Internal Medicine

## 2022-09-02 ENCOUNTER — Encounter: Payer: Self-pay | Admitting: Internal Medicine

## 2022-09-08 NOTE — Telephone Encounter (Signed)
Spoke with pt and scheduled her for Tuesday June 4th at 9:30.

## 2022-09-15 ENCOUNTER — Encounter: Payer: Self-pay | Admitting: Internal Medicine

## 2022-09-15 ENCOUNTER — Ambulatory Visit: Payer: BC Managed Care – PPO | Admitting: Internal Medicine

## 2022-09-15 ENCOUNTER — Other Ambulatory Visit: Payer: Self-pay | Admitting: Internal Medicine

## 2022-09-15 VITALS — BP 140/80 | HR 69 | Temp 98.0°F | Ht 68.0 in | Wt 156.1 lb

## 2022-09-15 DIAGNOSIS — F321 Major depressive disorder, single episode, moderate: Secondary | ICD-10-CM

## 2022-09-15 DIAGNOSIS — E782 Mixed hyperlipidemia: Secondary | ICD-10-CM | POA: Diagnosis not present

## 2022-09-15 DIAGNOSIS — L700 Acne vulgaris: Secondary | ICD-10-CM

## 2022-09-15 DIAGNOSIS — M2550 Pain in unspecified joint: Secondary | ICD-10-CM

## 2022-09-15 LAB — SEDIMENTATION RATE: Sed Rate: 10 mm/hr (ref 0–30)

## 2022-09-15 LAB — COMPREHENSIVE METABOLIC PANEL
ALT: 12 U/L (ref 0–35)
AST: 14 U/L (ref 0–37)
Albumin: 4 g/dL (ref 3.5–5.2)
Alkaline Phosphatase: 55 U/L (ref 39–117)
BUN: 13 mg/dL (ref 6–23)
CO2: 28 mEq/L (ref 19–32)
Calcium: 8.8 mg/dL (ref 8.4–10.5)
Chloride: 103 mEq/L (ref 96–112)
Creatinine, Ser: 0.9 mg/dL (ref 0.40–1.20)
GFR: 69.57 mL/min (ref >60.00)
Glucose, Bld: 103 mg/dL — ABNORMAL HIGH (ref 70–99)
Potassium: 3.8 mEq/L (ref 3.5–5.1)
Sodium: 137 mEq/L (ref 135–145)
Total Bilirubin: 0.4 mg/dL (ref 0.2–1.2)
Total Protein: 6.7 g/dL (ref 6.0–8.3)

## 2022-09-15 LAB — LIPID PANEL
Cholesterol: 200 mg/dL (ref 0–200)
HDL: 50.4 mg/dL (ref >39.00)
LDL Cholesterol: 126 mg/dL — ABNORMAL HIGH (ref 0–99)
NonHDL: 149.2
Total CHOL/HDL Ratio: 4
Triglycerides: 118 mg/dL (ref 0.0–149.0)
VLDL: 23.6 mg/dL (ref 0.0–40.0)

## 2022-09-15 LAB — C-REACTIVE PROTEIN: CRP: <1 mg/dL (ref 0.5–20.0)

## 2022-09-15 MED ORDER — ROSUVASTATIN CALCIUM 10 MG PO TABS
10.0000 mg | ORAL_TABLET | Freq: Every day | ORAL | 0 refills | Status: DC
Start: 2022-09-15 — End: 2022-12-10

## 2022-09-15 MED ORDER — DOXYCYCLINE HYCLATE 100 MG PO TABS: 100.0000 mg | ORAL_TABLET | Freq: Two times a day (BID) | ORAL | 0 refills | Status: DC

## 2022-09-15 NOTE — Patient Instructions (Signed)
  Your aortic atherosclerosis places you at increased risk for heart attack and stroke  I recommend that you repeat a trial of Crestor .  Start with one tablet DAILY  IN THE EVENING AFTER DINNER.  Return in 3-4 weeks for NON FASTING LIVER LABS  DOXYCYCLINE TWICE DAILY FOR 1 WEEK

## 2022-09-15 NOTE — Progress Notes (Unsigned)
Subjective:  Patient ID: Allison Alvarez, female    DOB: Dec 21, 1960  Age: 62 y.o. MRN: [id number]  CC: There were no encounter diagnoses.   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  Allison Alvarez is going to counselling .  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 The confrontation was adversarial and ended abruptly after the advocate blamed patient for her recent seizure.  The confrontation led to a formal complaint by the Advocate, causing an investigation against the patient  , which occurred while she was on FMLA from Dec 27 to Jan 15 for bowel perforation requiring surgery.  Following her return from her FMLA,  and folloiwng the investigation,  she was ostracized and ignored by her Librarian, academic, and after a month of continued shunning, patient turned in her resignation.    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 She has continued to receive counselling through [facility] but continues to feel that the work environment is compromised by the immaturity and nepotism nurtured by the Librarian, academic who is 15 yrs her Holiday representative.    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 Hepatic steatosis. 2. Evidence of prior cholecystectomy, appendectomy and hysterectomy. 3. Stable findings which may represent debris within urethral diverticula. 4. Colonic diverticulosis. 5. Aortic atherosclerosis.  Outpatient Medications Prior to Visit  Medication Sig Dispense Refill   acetaminophen (TYLENOL) 325 MG tablet Take 650 mg by mouth every 6 (six) hours as needed for moderate pain.     ALPRAZolam (XANAX) 0.25 MG tablet Take 0.25 mg by mouth.     amphetamine-dextroamphetamine (ADDERALL XR) 25 MG 24 hr capsule Take 1 capsule by mouth daily. 90 capsule 0   busPIRone (BUSPAR) 15 MG tablet Take 1 tablet (15 mg total) by mouth 2 (two) times daily. 60 tablet 2   cyanocobalamin (VITAMIN B12) 1000 MCG/ML injection Inject 1 mL (1,000 mcg total) into the muscle every 30 (thirty) days. 1 mL 11   famotidine (PEPCID) 20 MG tablet Take 1 tablet (20 mg total) by mouth daily for 5 days. Take with steroids for ulcer prophylaxis (continue your other medications as well) 5 tablet 0   gabapentin (NEURONTIN) 100 MG capsule TAKE 1 CAPSULE BY MOUTH TWICE A DAY 60 capsule 1   gabapentin (NEURONTIN) 300 MG capsule Take 1 capsule (300 mg total) by mouth every evening. 30 capsule 3   Iron, Ferrous Sulfate, 325 (65 Fe) MG TABS Take 325 mg by mouth every other day. Take with orange juice or food 45 tablet 0   metoprolol tartrate (LOPRESSOR) 25  MG tablet TAKE 1 TABLET BY MOUTH TWICE A DAY 180 tablet 1   ondansetron (ZOFRAN-ODT) 4 MG disintegrating tablet Take 1 tablet (4 mg total) by mouth every 8 (eight) hours as needed for nausea or vomiting. 20 tablet 0   pantoprazole (PROTONIX) 40 MG tablet Take 1 tablet (40 mg total) by mouth 2 (two) times daily. 60 tablet 2   sertraline (ZOLOFT) 100 MG tablet TAKE 1 AND 1/2 TABLET BY MOUTH DAILY 45 tablet 5   ALPRAZolam (XANAX) 0.5 MG tablet Take 1 tablet (0.5 mg total) by mouth at  bedtime as needed for anxiety. 30 tablet 2   amLODipine (NORVASC) 2.5 MG tablet TAKE 1 TABLET BY MOUTH TWICE A DAY 180 tablet 1   benzonatate (TESSALON PERLES) 100 MG capsule Take 1 capsule (100 mg total) by mouth 3 (three) times daily as needed for cough. (Patient not taking: Reported on 09/15/2022) 30 capsule 1   oxyCODONE-acetaminophen (PERCOCET) 5-325 MG tablet Take 1-2 tablets by mouth every 6 (six) hours as needed for severe pain or moderate pain (no more than 6 tabs daily). 12 tablet 0   No facility-administered medications prior to visit.    Review of Systems;  Patient denies headache, fevers, malaise, unintentional weight loss, skin rash, eye pain, sinus congestion and sinus pain, sore throat, dysphagia,  hemoptysis , cough, dyspnea, wheezing, chest pain, palpitations, orthopnea, edema, abdominal pain, nausea, melena, diarrhea, constipation, flank pain, dysuria, hematuria, urinary  Frequency, nocturia, numbness, tingling, seizures,  Focal weakness, Loss of consciousness,  Tremor, insomnia, depression, anxiety, and suicidal ideation.      Objective:  BP (!) 140/80   Pulse 69   Temp 98 F (36.7 C) (Oral)   Ht 5\' 8"  (1.727 m)   Wt 156 lb 1.9 oz (70.8 kg)   SpO2 97%   BMI 23.74 kg/m   BP Readings from Last 3 Encounters:  09/15/22 (!) 140/80  07/24/22 (!) 150/91  07/22/22 (!) 140/86    Wt Readings from Last 3 Encounters:  09/15/22 156 lb 1.9 oz (70.8 kg)  07/23/22 156 lb (70.8 kg)  07/22/22 157 lb 3.2 oz (71.3 kg)    Physical Exam  Lab Results  Component Value Date   HGBA1C 5.5 09/17/2021    Lab Results  Component Value Date   CREATININE 0.79 07/23/2022   CREATININE 0.83 07/22/2022   CREATININE 0.80 05/04/2022    Lab Results  Component Value Date   WBC 7.7 07/23/2022   HGB 10.8 (L) 07/23/2022   HCT 32.5 (L) 07/23/2022   PLT 287 07/23/2022   GLUCOSE 101 (H) 07/23/2022   CHOL 208 (H) 09/17/2021   TRIG 158.0 (H) 09/17/2021   HDL 53.00 09/17/2021   LDLCALC  123 (H) 09/17/2021   ALT 13 07/23/2022   AST 15 07/23/2022   NA 134 (L) 07/23/2022   K 3.1 (L) 07/23/2022   CL 100 07/23/2022   CREATININE 0.79 07/23/2022   BUN 11 07/23/2022   CO2 25 07/23/2022   TSH 1.03 09/17/2021   INR 1.1 04/12/2022   HGBA1C 5.5 09/17/2021    CT ABDOMEN PELVIS W CONTRAST  Result Date: 07/23/2022 CLINICAL DATA:  Right-sided abdominal pain. EXAM: CT ABDOMEN AND PELVIS WITH CONTRAST TECHNIQUE: Multidetector CT imaging of the abdomen and pelvis was performed using the standard protocol following bolus administration of intravenous contrast. RADIATION DOSE REDUCTION: This exam was performed according to the departmental dose-optimization program which includes automated exposure control, adjustment of the mA and/or kV according to patient size and/or  use of iterative reconstruction technique. CONTRAST:  OMNIPAQUE IOHEXOL 300 MG/ML  SOLN COMPARISON:  April 23, 2022 and January 16, 2015 FINDINGS: Lower chest: No acute abnormality. Hepatobiliary: There is diffuse fatty infiltration of the liver parenchyma. No focal liver abnormality is seen. Status post cholecystectomy. No biliary dilatation. Pancreas: Unremarkable. No pancreatic ductal dilatation or surrounding inflammatory changes. Spleen: Normal in size without focal abnormality. Adrenals/Urinary Tract: Adrenal glands are unremarkable. Kidneys are normal, without renal calculi, focal lesion, or hydronephrosis. Stable, well-defined hyperdense foci are seen just below the base of an otherwise normal appearing urinary bladder (axial CT images 80 through 84, CT series 2). These are seen within the region surrounding the proximal urethra. Stomach/Bowel: Stomach is within normal limits. The appendix is surgically absent. Surgically anastomosed bowel is seen within the region of the mid sigmoid colon. No evidence of bowel wall thickening, distention, or inflammatory changes. Noninflamed diverticula are seen throughout the descending  colon. Vascular/Lymphatic: Aortic atherosclerosis. No enlarged abdominal or pelvic lymph nodes. Reproductive: Status post hysterectomy. No adnexal masses. Other: No abdominal wall hernia or abnormality. No abdominopelvic ascites. Musculoskeletal: No acute or significant osseous findings. IMPRESSION: 1. Hepatic steatosis. 2. Evidence of prior cholecystectomy, appendectomy and hysterectomy. 3. Stable findings which may represent debris within urethral diverticula. 4. Colonic diverticulosis. 5. Aortic atherosclerosis. Aortic Atherosclerosis (ICD10-I70.0). Electronically Signed   By: Aram Candela M.D.   On: 07/23/2022 23:13   DG Chest 2 View  Result Date: 07/23/2022 CLINICAL DATA:  Cough and right flank pain EXAM: CHEST - 2 VIEW COMPARISON:  04/23/2022, 02/23/2018 FINDINGS: Bronchitic changes. Mild irregular left lower lung opacity. Right lung shows no focal airspace disease. No pleural effusion or pneumothorax. Normal cardiomediastinal silhouette. IMPRESSION: Bronchitic changes with mild irregular opacity at the left base which may be due to small focus of pneumonia. Short interval radiographic follow-up is recommended. Electronically Signed   By: Jasmine Pang M.D.   On: 07/23/2022 22:18    Assessment & Plan:  .There are no diagnoses linked to this encounter.   I provided 30 minutes of face-to-face time during this encounter reviewing patient's last visit with me, patient's  most recent visit with cardiology,  nephrology,  and neurology,  recent surgical and non surgical procedures, previous  labs and imaging studies, counseling on currently addressed issues,  and post visit ordering to diagnostics and therapeutics .   Follow-up: No follow-ups on file.   Sherlene Shams, MD

## 2022-09-16 NOTE — Assessment & Plan Note (Signed)
Aggravated by her stepfather's impending death and increased stressors at work.  Counselling given.  She is considering a temporary LOA

## 2022-09-17 LAB — ANTI-NUCLEAR AB-TITER (ANA TITER): ANA Titer 1: 1:40 {titer} — ABNORMAL HIGH

## 2022-09-17 LAB — ANA: Anti Nuclear Antibody (ANA): POSITIVE — AB

## 2022-09-17 LAB — RHEUMATOID FACTOR: Rheumatoid fact SerPl-aCnc: <10 IU/mL (ref <14)

## 2022-09-17 LAB — CYCLIC CITRUL PEPTIDE ANTIBODY, IGG: Cyclic Citrullin Peptide Ab: <16 UNITS

## 2022-09-20 LAB — TESTOSTERONE,FREE AND TOTAL
Testosterone, Free: 0.3 pg/mL (ref 0.0–4.2)
Testosterone: <3 ng/dL — ABNORMAL LOW (ref 4–50)

## 2022-09-29 ENCOUNTER — Ambulatory Visit: Payer: BC Managed Care – PPO | Admitting: Internal Medicine

## 2022-10-04 ENCOUNTER — Other Ambulatory Visit: Payer: Self-pay | Admitting: Family Medicine

## 2022-10-05 NOTE — Telephone Encounter (Signed)
Last OV 02/20/22 Next OV not scheduled  Last refill 06/30/22 Qty #90/0  Forwarding to Dr. [name]

## 2022-10-06 ENCOUNTER — Other Ambulatory Visit (INDEPENDENT_AMBULATORY_CARE_PROVIDER_SITE_OTHER): Payer: BC Managed Care – PPO

## 2022-10-06 DIAGNOSIS — E782 Mixed hyperlipidemia: Secondary | ICD-10-CM | POA: Diagnosis not present

## 2022-10-06 DIAGNOSIS — D649 Anemia, unspecified: Secondary | ICD-10-CM | POA: Diagnosis not present

## 2022-10-06 LAB — CBC WITH DIFFERENTIAL/PLATELET
Basophils Absolute: 0 10*3/uL (ref 0.0–0.1)
Basophils Relative: 0.7 % (ref 0.0–3.0)
Eosinophils Absolute: 0.2 10*3/uL (ref 0.0–0.7)
Eosinophils Relative: 2.8 % (ref 0.0–5.0)
HCT: 32.6 % — ABNORMAL LOW (ref 36.0–46.0)
Hemoglobin: 10.6 g/dL — ABNORMAL LOW (ref 12.0–15.0)
Lymphocytes Relative: 28.9 % (ref 12.0–46.0)
Lymphs Abs: 1.7 10*3/uL (ref 0.7–4.0)
MCHC: 32.6 g/dL (ref 30.0–36.0)
MCV: 99.5 fl (ref 78.0–100.0)
Monocytes Absolute: 0.4 10*3/uL (ref 0.1–1.0)
Monocytes Relative: 6.1 % (ref 3.0–12.0)
Neutro Abs: 3.5 10*3/uL (ref 1.4–7.7)
Neutrophils Relative %: 61.5 % (ref 43.0–77.0)
Platelets: 271 10*3/uL (ref 150.0–400.0)
RBC: 3.28 Mil/uL — ABNORMAL LOW (ref 3.87–5.11)
RDW: 14.4 % (ref 11.5–15.5)
WBC: 5.7 10*3/uL (ref 4.0–10.5)

## 2022-10-06 LAB — HEPATIC FUNCTION PANEL
ALT: 15 U/L (ref 0–35)
AST: 17 U/L (ref 0–37)
Albumin: 4 g/dL (ref 3.5–5.2)
Alkaline Phosphatase: 56 U/L (ref 39–117)
Bilirubin, Direct: 0.1 mg/dL (ref 0.0–0.3)
Total Bilirubin: 0.6 mg/dL (ref 0.2–1.2)
Total Protein: 6.6 g/dL (ref 6.0–8.3)

## 2022-10-06 MED ORDER — AMPHETAMINE-DEXTROAMPHET ER 25 MG PO CP24: 25.0000 mg | ORAL_CAPSULE | Freq: Every day | ORAL | 0 refills | Status: DC

## 2022-10-08 NOTE — Addendum Note (Signed)
Addended by: Sherlene Shams on: 10/08/2022 04:51 PM   Modules accepted: Orders

## 2022-10-12 ENCOUNTER — Other Ambulatory Visit (INDEPENDENT_AMBULATORY_CARE_PROVIDER_SITE_OTHER): Payer: BC Managed Care – PPO

## 2022-10-12 DIAGNOSIS — D649 Anemia, unspecified: Secondary | ICD-10-CM

## 2022-10-12 LAB — TSH: TSH: 1.37 u[IU]/mL (ref 0.35–5.50)

## 2022-10-12 LAB — B12 AND FOLATE PANEL
Folate: 5.9 ng/mL — ABNORMAL LOW (ref >5.9)
Vitamin B-12: 385 pg/mL (ref 211–911)

## 2022-10-13 ENCOUNTER — Encounter: Payer: Self-pay | Admitting: Internal Medicine

## 2022-10-14 ENCOUNTER — Encounter: Payer: Self-pay | Admitting: Internal Medicine

## 2022-10-14 LAB — IFE AND PE, RANDOM URINE: ALPHA-2-GLOBULIN, U: 16.3 %

## 2022-10-15 ENCOUNTER — Other Ambulatory Visit: Payer: Self-pay | Admitting: Internal Medicine

## 2022-10-15 LAB — IFE AND PE, RANDOM URINE
% BETA, Urine: 33.3 %
ALBUMIN, U: 31.6 %
ALPHA 1 URINE: 3.4 %
GAMMA GLOBULIN URINE: 15.4 %
Protein, Ur: 12.8 mg/dL

## 2022-10-15 MED ORDER — DOXYCYCLINE HYCLATE 50 MG PO CAPS: 50.0000 mg | ORAL_CAPSULE | Freq: Every day | ORAL | 1 refills | Status: DC

## 2022-10-16 LAB — PROTEIN ELECTROPHORESIS, SERUM
Albumin ELP: 4.1 g/dL (ref 3.8–4.8)
Alpha 1: 0.3 g/dL (ref 0.2–0.3)
Alpha 2: 0.6 g/dL (ref 0.5–0.9)
Beta 2: 0.3 g/dL (ref 0.2–0.5)
Beta Globulin: 0.4 g/dL (ref 0.4–0.6)
Gamma Globulin: 0.9 g/dL (ref 0.8–1.7)
Total Protein: 6.6 g/dL (ref 6.1–8.1)

## 2022-10-16 LAB — IRON,TIBC AND FERRITIN PANEL
%SAT: 38 % (ref 16–45)
Ferritin: 56 ng/mL (ref 16–232)
Iron: 99 ug/dL (ref 45–160)
TIBC: 258 mcg/dL (calc) (ref 250–450)

## 2022-10-18 ENCOUNTER — Other Ambulatory Visit: Payer: Self-pay | Admitting: Internal Medicine

## 2022-10-20 ENCOUNTER — Encounter: Payer: Self-pay | Admitting: Internal Medicine

## 2022-10-20 DIAGNOSIS — R300 Dysuria: Secondary | ICD-10-CM

## 2022-10-20 NOTE — Telephone Encounter (Signed)
Is it okay to have pt come in and give [place] a urine sample this afternoon?

## 2022-10-20 NOTE — Telephone Encounter (Signed)
Please place patient on the lab schedule for Wednesday so she can provide a urine specimen  Regards,   Duncan Dull, MD

## 2022-10-21 ENCOUNTER — Other Ambulatory Visit (INDEPENDENT_AMBULATORY_CARE_PROVIDER_SITE_OTHER): Payer: BC Managed Care – PPO

## 2022-10-21 DIAGNOSIS — R300 Dysuria: Secondary | ICD-10-CM | POA: Diagnosis not present

## 2022-10-21 LAB — URINALYSIS, ROUTINE W REFLEX MICROSCOPIC
Bilirubin Urine: NEGATIVE
Ketones, ur: NEGATIVE
Nitrite: NEGATIVE
Specific Gravity, Urine: 1.025 (ref 1.000–1.030)
Urine Glucose: NEGATIVE
Urobilinogen, UA: 0.2 (ref 0.0–1.0)
pH: 6 (ref 5.0–8.0)

## 2022-10-22 NOTE — Telephone Encounter (Signed)
Pt called wanting to know her lab results

## 2022-10-23 LAB — URINE CULTURE
MICRO NUMBER:: 15182519
SPECIMEN QUALITY:: ADEQUATE

## 2022-10-23 MED ORDER — CIPROFLOXACIN HCL 250 MG PO TABS: 250.0000 mg | ORAL_TABLET | Freq: Two times a day (BID) | ORAL | 0 refills | Status: AC

## 2022-10-23 NOTE — Addendum Note (Signed)
Addended by: Sherlene Shams on: 10/23/2022 01:02 PM   Modules accepted: Orders

## 2022-11-01 ENCOUNTER — Other Ambulatory Visit: Payer: Self-pay | Admitting: Internal Medicine

## 2022-11-26 ENCOUNTER — Encounter (INDEPENDENT_AMBULATORY_CARE_PROVIDER_SITE_OTHER): Payer: Self-pay

## 2022-12-01 ENCOUNTER — Other Ambulatory Visit: Payer: Self-pay | Admitting: Internal Medicine

## 2022-12-10 ENCOUNTER — Other Ambulatory Visit: Payer: Self-pay | Admitting: Internal Medicine

## 2022-12-10 DIAGNOSIS — E782 Mixed hyperlipidemia: Secondary | ICD-10-CM

## 2022-12-13 ENCOUNTER — Other Ambulatory Visit: Payer: Self-pay | Admitting: Internal Medicine

## 2022-12-15 NOTE — Telephone Encounter (Signed)
LOV: 09/15/2022   NOV: 12/23/22

## 2022-12-23 ENCOUNTER — Encounter: Payer: Self-pay | Admitting: Internal Medicine

## 2022-12-23 ENCOUNTER — Ambulatory Visit: Payer: BC Managed Care – PPO | Admitting: Internal Medicine

## 2022-12-23 VITALS — BP 116/74 | HR 62 | Temp 98.2°F | Ht 68.0 in | Wt 156.4 lb

## 2022-12-23 DIAGNOSIS — Z23 Encounter for immunization: Secondary | ICD-10-CM | POA: Diagnosis not present

## 2022-12-23 DIAGNOSIS — L700 Acne vulgaris: Secondary | ICD-10-CM | POA: Insufficient documentation

## 2022-12-23 DIAGNOSIS — M797 Fibromyalgia: Secondary | ICD-10-CM

## 2022-12-23 DIAGNOSIS — K265 Chronic or unspecified duodenal ulcer with perforation: Secondary | ICD-10-CM

## 2022-12-23 DIAGNOSIS — L280 Lichen simplex chronicus: Secondary | ICD-10-CM

## 2022-12-23 DIAGNOSIS — F411 Generalized anxiety disorder: Secondary | ICD-10-CM

## 2022-12-23 DIAGNOSIS — G2581 Restless legs syndrome: Secondary | ICD-10-CM | POA: Diagnosis not present

## 2022-12-23 DIAGNOSIS — F4321 Adjustment disorder with depressed mood: Secondary | ICD-10-CM

## 2022-12-23 DIAGNOSIS — I10 Essential (primary) hypertension: Secondary | ICD-10-CM

## 2022-12-23 DIAGNOSIS — D500 Iron deficiency anemia secondary to blood loss (chronic): Secondary | ICD-10-CM

## 2022-12-23 MED ORDER — GABAPENTIN 100 MG PO CAPS: 100.0000 mg | ORAL_CAPSULE | Freq: Two times a day (BID) | ORAL | 0 refills | Status: DC

## 2022-12-23 MED ORDER — METOPROLOL TARTRATE 25 MG PO TABS: 25.0000 mg | ORAL_TABLET | Freq: Two times a day (BID) | ORAL | 1 refills | Status: DC

## 2022-12-23 MED ORDER — CYANOCOBALAMIN 1000 MCG/ML IJ SOLN: 1000.0000 ug | INTRAMUSCULAR | 11 refills | Status: DC

## 2022-12-23 MED ORDER — SERTRALINE HCL 100 MG PO TABS: 150.0000 mg | ORAL_TABLET | Freq: Every day | ORAL | 5 refills | Status: DC

## 2022-12-23 MED ORDER — ROPINIROLE HCL 0.25 MG PO TABS: ORAL_TABLET | ORAL | 1 refills | Status: DC

## 2022-12-23 NOTE — Assessment & Plan Note (Addendum)
Testosterone level normal.  Taking estrogen. Continue topical salicylic acid,  doxy,  return to dermatology

## 2022-12-23 NOTE — Assessment & Plan Note (Signed)
Recurrent since her health issues began.  Continue  gabapentin 100 mg bid

## 2022-12-23 NOTE — Assessment & Plan Note (Addendum)
Improved with Change from celexa to zoloft for obsessive worrying,  and addition of prn alprazolam prn insomnia . Counselling given today .  Continue zoloft 150 mg daily

## 2022-12-23 NOTE — Patient Instructions (Signed)
I am prescribing Requip for your restless legs at the starting low dose   Start with 1 tablet (0.25 mg) after dinner,  and repeat 1 hour prior to bedtime  You can add a daytime dose if needed,  You may increase the dose after one week if symptoms have not improved    At next visit we will change zoloft to [facility] to manage the fibromyalgia pain .

## 2022-12-23 NOTE — Progress Notes (Signed)
Subjective:  Patient ID: Allison Alvarez, female    DOB: [birth year]  Age: 62 y.o. MRN: [id number]  CC: The primary encounter diagnosis was Restless legs. Diagnoses of Need for influenza vaccination, Duodenal ulcer perforation (HCC), GAD (generalized anxiety disorder), Hypertension, essential, Neurodermatitis, Anemia due to GI blood loss, Unresolved grief, Fibromyalgia, RLS (restless legs syndrome), and Acne cystica were also pertinent to this visit.   HPI Noriah Stjulian presents for follow up on multiple issues Chief Complaint  Patient presents with   Medical Management of Chronic Issues    Follow up on fibromyalgia    1) generalized body pain.  Patient was last seen in June,  serologies for autoimmune forms of polyarhtritis were negatie except for ANA titre of 1:40 )nuclear speckled pattern) with normal ESR , CRP and RA . She is taking  Takes gabapentin 3 times daily .  Fatigue is making it hard to stay awake in the afternoons.  Legs feel restless at night ,  prior surgery on left ankle for tendon lengthening has been problematic ever since.     2) Grief:  her beloved stepfather Rosanne Ashing  passed in August.  Having a hard time  grieving..  Going to [facility] at [employer] with her mother.  Returned to work a week later.  Having trouble grieving because she feels she has to take care of anyone else and be in control.  Complicated by her mother's siblings and relatives ostracizing her  because of something that happened 6 years ago.  Not sleeping well,  can't turn brain off.  Also feels restless during the day   3) Generalized anxiety:  aggravated by feeling she is responsible for everyone else's care incluging husband  mother,  grandchildren.  . Husband is drinking again,   relies on her to make decisions but capable of making them independently.   4) Cystic acne still recurring despite [place] of doxycycline  Outpatient Medications Prior to Visit  Medication Sig Dispense Refill   acetaminophen (TYLENOL)  325 MG tablet Take 650 mg by mouth every 6 (six) hours as needed for moderate pain.     ALPRAZolam (XANAX) 0.25 MG tablet Take 1 tablet (0.25 mg total) by mouth at bedtime as needed for anxiety. 30 tablet 2   amphetamine-dextroamphetamine (ADDERALL XR) 25 MG 24 hr capsule Take 1 capsule by mouth daily. 90 capsule 0   busPIRone (BUSPAR) 15 MG tablet TAKE 1 TABLET BY MOUTH 2 TIMES A DAY 180 tablet 1   doxycycline (VIBRAMYCIN) 50 MG capsule Take 1 capsule (50 mg total) by mouth daily. 90 capsule 1   gabapentin (NEURONTIN) 300 MG capsule Take 1 capsule (300 mg total) by mouth every evening. 30 capsule 3   ondansetron (ZOFRAN-ODT) 4 MG disintegrating tablet Take 1 tablet (4 mg total) by mouth every 8 (eight) hours as needed for nausea or vomiting. 20 tablet 0   pantoprazole (PROTONIX) 40 MG tablet TAKE 1 TABLET BY MOUTH TWICE A DAY 180 tablet 3   rosuvastatin (CRESTOR) 10 MG tablet TAKE 1 TABLET BY MOUTH DAILY 90 tablet 3   cyanocobalamin (VITAMIN B12) 1000 MCG/ML injection Inject 1 mL (1,000 mcg total) into the muscle every 30 (thirty) days. 1 mL 11   gabapentin (NEURONTIN) 100 MG capsule TAKE 1 CAPSULE BY MOUTH 2 TIMES A DAY 60 capsule 0   metoprolol tartrate (LOPRESSOR) 25 MG tablet TAKE 1 TABLET BY MOUTH TWICE A DAY 180 tablet 1   sertraline (ZOLOFT) 100 MG tablet TAKE 1 AND  1/2 TABLET BY MOUTH DAILY 45 tablet 5   famotidine (PEPCID) 20 MG tablet Take 1 tablet (20 mg total) by mouth daily for 5 days. Take with steroids for ulcer prophylaxis (continue your other medications as well) 5 tablet 0   Iron, Ferrous Sulfate, 325 (65 Fe) MG TABS Take 325 mg by mouth every other day. Take with orange juice or food (Patient not taking: Reported on 12/23/2022) 45 tablet 0   No facility-administered medications prior to visit.    Review of Systems;  Patient denies headache, fevers, malaise, unintentional weight loss, skin rash, eye pain, sinus congestion and sinus pain, sore throat, dysphagia,  hemoptysis ,  cough, dyspnea, wheezing, chest pain, palpitations, orthopnea, edema, abdominal pain, nausea, melena, diarrhea, constipation, flank pain, dysuria, hematuria, urinary  Frequency, nocturia, numbness, tingling, seizures,  Focal weakness, Loss of consciousness,  Tremor, insomnia, and suicidal ideation.      Objective:  BP 116/74   Pulse 62   Temp 98.2 F (36.8 C) (Oral)   Ht 5\' 8"  (1.727 m)   Wt 156 lb 6.4 oz (70.9 kg)   SpO2 97%   BMI 23.78 kg/m   BP Readings from Last 3 Encounters:  12/23/22 116/74  09/15/22 (!) 140/80  07/24/22 (!) 150/91    Wt Readings from Last 3 Encounters:  12/23/22 156 lb 6.4 oz (70.9 kg)  09/15/22 156 lb 1.9 oz (70.8 kg)  07/23/22 156 lb (70.8 kg)    Physical Exam Vitals reviewed.  Constitutional:      General: She is not in acute distress.    Appearance: Normal appearance. She is normal weight. She is not ill-appearing, toxic-appearing or diaphoretic.  HENT:     Head: Normocephalic.  Eyes:     General: No scleral icterus.       Right eye: No discharge.        Left eye: No discharge.     Conjunctiva/sclera: Conjunctivae normal.  Cardiovascular:     Rate and Rhythm: Normal rate and regular rhythm.     Heart sounds: Normal heart sounds.  Pulmonary:     Effort: Pulmonary effort is normal. No respiratory distress.     Breath sounds: Normal breath sounds.  Musculoskeletal:        General: Normal range of motion.  Skin:    General: Skin is warm and dry.  Neurological:     General: No focal deficit present.     Mental Status: She is alert and oriented to person, place, and time. Mental status is at baseline.  Psychiatric:        Mood and Affect: Mood normal.        Behavior: Behavior normal.        Thought Content: Thought content normal.        Judgment: Judgment normal.    Lab Results  Component Value Date   HGBA1C 5.5 09/17/2021    Lab Results  Component Value Date   CREATININE 0.90 09/15/2022   CREATININE 0.79 07/23/2022    CREATININE 0.83 07/22/2022    Lab Results  Component Value Date   WBC 5.7 10/06/2022   HGB 10.6 (L) 10/06/2022   HCT 32.6 (L) 10/06/2022   PLT 271.0 10/06/2022   GLUCOSE 103 (H) 09/15/2022   CHOL 200 09/15/2022   TRIG 118.0 09/15/2022   HDL 50.40 09/15/2022   LDLCALC 126 (H) 09/15/2022   ALT 15 10/06/2022   AST 17 10/06/2022   NA 137 09/15/2022   K 3.8 09/15/2022   CL  103 09/15/2022   CREATININE 0.90 09/15/2022   BUN 13 09/15/2022   CO2 28 09/15/2022   TSH 1.37 10/12/2022   INR 1.1 04/12/2022   HGBA1C 5.5 09/17/2021    CT ABDOMEN PELVIS W CONTRAST  Result Date: 07/23/2022 CLINICAL DATA:  Right-sided abdominal pain. EXAM: CT ABDOMEN AND PELVIS WITH CONTRAST TECHNIQUE: Multidetector CT imaging of the abdomen and pelvis was performed using the standard protocol following bolus administration of intravenous contrast. RADIATION DOSE REDUCTION: This exam was performed according to the departmental dose-optimization program which includes automated exposure control, adjustment of the mA and/or kV according to patient size and/or use of iterative reconstruction technique. CONTRAST:  OMNIPAQUE IOHEXOL 300 MG/ML  SOLN COMPARISON:  April 23, 2022 and January 16, 2015 FINDINGS: Lower chest: No acute abnormality. Hepatobiliary: There is diffuse fatty infiltration of the liver parenchyma. No focal liver abnormality is seen. Status post cholecystectomy. No biliary dilatation. Pancreas: Unremarkable. No pancreatic ductal dilatation or surrounding inflammatory changes. Spleen: Normal in size without focal abnormality. Adrenals/Urinary Tract: Adrenal glands are unremarkable. Kidneys are normal, without renal calculi, focal lesion, or hydronephrosis. Stable, well-defined hyperdense foci are seen just below the base of an otherwise normal appearing urinary bladder (axial CT images 80 through 84, CT series 2). These are seen within the region surrounding the proximal urethra. Stomach/Bowel: Stomach is  within normal limits. The appendix is surgically absent. Surgically anastomosed bowel is seen within the region of the mid sigmoid colon. No evidence of bowel wall thickening, distention, or inflammatory changes. Noninflamed diverticula are seen throughout the descending colon. Vascular/Lymphatic: Aortic atherosclerosis. No enlarged abdominal or pelvic lymph nodes. Reproductive: Status post hysterectomy. No adnexal masses. Other: No abdominal wall hernia or abnormality. No abdominopelvic ascites. Musculoskeletal: No acute or significant osseous findings. IMPRESSION: 1. Hepatic steatosis. 2. Evidence of prior cholecystectomy, appendectomy and hysterectomy. 3. Stable findings which may represent debris within urethral diverticula. 4. Colonic diverticulosis. 5. Aortic atherosclerosis. Aortic Atherosclerosis (ICD10-I70.0). Electronically Signed   By: Aram Candela M.D.   On: 07/23/2022 23:13   DG Chest 2 View  Result Date: 07/23/2022 CLINICAL DATA:  Cough and right flank pain EXAM: CHEST - 2 VIEW COMPARISON:  04/23/2022, 02/23/2018 FINDINGS: Bronchitic changes. Mild irregular left lower lung opacity. Right lung shows no focal airspace disease. No pleural effusion or pneumothorax. Normal cardiomediastinal silhouette. IMPRESSION: Bronchitic changes with mild irregular opacity at the left base which may be due to small focus of pneumonia. Short interval radiographic follow-up is recommended. Electronically Signed   By: Jasmine Pang M.D.   On: 07/23/2022 22:18    Assessment & Plan:  .Restless legs -     Iron, TIBC and Ferritin Panel  Need for influenza vaccination -     Flu vaccine trivalent PF, 6mos and older(Flulaval,Afluria,Fluarix,Fluzone)  Duodenal ulcer perforation (HCC) Assessment & Plan: With h/o duodenal perforation and GI bleed.  Resolved by follow up  EGD March 1 .  Caused by NSAIDS.  Continue lifelong PPI    GAD (generalized anxiety disorder) Assessment & Plan: Improved with Change from  celexa to zoloft for obsessive worrying,  and addition of prn alprazolam prn insomnia . Counselling given today .  Continue zoloft 150 mg daily    Hypertension, essential Assessment & Plan: meds were suspended during rehospitalization due to hypotension and resumed as needed.  She is currently taking metoprolol  25 mg bid.  No changes today    Neurodermatitis Assessment & Plan: Recurrent since her health issues began.  Continue  gabapentin  100 mg bid    Anemia due to GI blood loss Assessment & Plan: IMPROVED post transfusion with iron supplementation  recheck iron/hgb needed  Lab Results  Component Value Date   IRON 99 10/12/2022   TIBC 258 10/12/2022   FERRITIN 56 10/12/2022     Lab Results  Component Value Date   WBC 5.7 10/06/2022   HGB 10.6 (L) 10/06/2022   HCT 32.6 (L) 10/06/2022   MCV 99.5 10/06/2022   PLT 271.0 10/06/2022      Unresolved grief Assessment & Plan: Patient is dealing with the loss of sptepgfather Esther Hardy and has adequate coping skills and emotional support .  i have asked patient to return in one month to examine for signs of unresolving grief.     Fibromyalgia Assessment & Plan: Consider changing zoloft to '[place] at follow up   RLS (restless legs syndrome) Assessment & Plan: Checking iron stores to rule out contribution .  Starting requip   Acne cystica Assessment & Plan: Testosterone level normal.  Taking estrogen. Continue topical salicylic acid,  doxy,  return to dermatology   Other orders -     Cyanocobalamin; Inject 1 mL (1,000 mcg total) into the muscle every 30 (thirty) days.  Dispense: 1 mL; Refill: 11 -     Gabapentin; Take 1 capsule (100 mg total) by mouth 2 (two) times daily.  Dispense: 60 capsule; Refill: 0 -     Metoprolol Tartrate; Take 1 tablet (25 mg total) by mouth 2 (two) times daily.  Dispense: 180 tablet; Refill: 1 -     Sertraline HCl; Take 1.5 tablets (150 mg total) by mouth daily.  Dispense: 45 tablet; Refill:  5 -     rOPINIRole HCl; One tablet after dinner , repeat at bedtime.  Increase weekly as needed  Dispense: 90 tablet; Refill: 1    I provided  43  minutes of face-to-face time during this encounter reviewing patient's last visit with me, patient's  most recent visit with  Gastroenterology,  recent surgical and non surgical procedures, previous  labs and imaging studies, counseling on currently addressed issues,  and post visit ordering to diagnostics and therapeutics .   Follow-up: Return in about 4 weeks (around 01/20/2023) for chronic pain management.   Sherlene Shams, MD

## 2022-12-23 NOTE — Assessment & Plan Note (Addendum)
meds were suspended during rehospitalization due to hypotension and resumed as needed.  She is currently taking metoprolol  25 mg bid.  No changes today

## 2022-12-23 NOTE — Assessment & Plan Note (Signed)
Consider changing zoloft to 'Savella at follow up

## 2022-12-23 NOTE — Assessment & Plan Note (Signed)
Patient is dealing with the loss of sptepgfather Allison Alvarez and has adequate coping skills and emotional support .  i have asked patient to return in one month to examine for signs of unresolving grief.

## 2022-12-23 NOTE — Assessment & Plan Note (Addendum)
With h/o duodenal perforation and GI bleed.  Resolved by follow up  EGD March 1 .  Caused by NSAIDS.  Continue lifelong PPI

## 2022-12-23 NOTE — Assessment & Plan Note (Signed)
Checking iron stores to rule out contribution .  Starting requip

## 2022-12-23 NOTE — Assessment & Plan Note (Signed)
IMPROVED post transfusion with iron supplementation  recheck iron/hgb needed  Lab Results  Component Value Date   IRON 99 10/12/2022   TIBC 258 10/12/2022   FERRITIN 56 10/12/2022     Lab Results  Component Value Date   WBC 5.7 10/06/2022   HGB 10.6 (L) 10/06/2022   HCT 32.6 (L) 10/06/2022   MCV 99.5 10/06/2022   PLT 271.0 10/06/2022

## 2022-12-24 LAB — IRON,TIBC AND FERRITIN PANEL
%SAT: 40 % (ref 16–45)
Ferritin: 51 ng/mL (ref 16–232)
Iron: 107 ug/dL (ref 45–160)
TIBC: 268 ug/dL (ref 250–450)

## 2023-01-10 ENCOUNTER — Other Ambulatory Visit: Payer: Self-pay | Admitting: Internal Medicine

## 2023-01-10 ENCOUNTER — Other Ambulatory Visit: Payer: Self-pay | Admitting: Family Medicine

## 2023-01-11 NOTE — Telephone Encounter (Signed)
Last OV 02/20/22 Next OV not scheduled  Last refill 10/06/22 Qty #90/0  Controlled substance, forwarding to Dr. [name].   Please note, it has been almost 1 year since last visit.

## 2023-01-12 MED ORDER — AMPHETAMINE-DEXTROAMPHET ER 25 MG PO CP24: 25.0000 mg | ORAL_CAPSULE | Freq: Every day | ORAL | 0 refills | Status: DC

## 2023-01-14 ENCOUNTER — Encounter: Payer: Self-pay | Admitting: Internal Medicine

## 2023-01-14 DIAGNOSIS — I10 Essential (primary) hypertension: Secondary | ICD-10-CM

## 2023-01-15 MED ORDER — AMLODIPINE BESYLATE 2.5 MG PO TABS: 2.5000 mg | ORAL_TABLET | Freq: Every day | ORAL | 1 refills | Status: DC

## 2023-01-15 NOTE — Assessment & Plan Note (Signed)
Adding amlodipine 2.5 mg for elevated home readings . Advised to increase dose to 5 mg after 3 days if bp is not 130/80 or less

## 2023-01-20 ENCOUNTER — Ambulatory Visit: Payer: BC Managed Care – PPO | Admitting: Internal Medicine

## 2023-01-20 ENCOUNTER — Encounter: Payer: Self-pay | Admitting: Internal Medicine

## 2023-01-20 VITALS — BP 142/90 | HR 84 | Ht 68.0 in | Wt 159.4 lb

## 2023-01-20 DIAGNOSIS — I10 Essential (primary) hypertension: Secondary | ICD-10-CM | POA: Diagnosis not present

## 2023-01-20 DIAGNOSIS — M797 Fibromyalgia: Secondary | ICD-10-CM | POA: Diagnosis not present

## 2023-01-20 MED ORDER — SAVELLA TITRATION PACK 12.5 & 25 & 50 MG PO MISC: ORAL | 0 refills | Status: DC

## 2023-01-20 MED ORDER — SERTRALINE HCL 50 MG PO TABS: ORAL_TABLET | ORAL | 0 refills | Status: DC

## 2023-01-20 MED ORDER — AMLODIPINE BESYLATE 5 MG PO TABS: 5.0000 mg | ORAL_TABLET | Freq: Every day | ORAL | 1 refills | Status: DC

## 2023-01-20 MED ORDER — ALPRAZOLAM 0.25 MG PO TABS: 0.2500 mg | ORAL_TABLET | Freq: Two times a day (BID) | ORAL | 2 refills | Status: DC | PRN

## 2023-01-20 MED ORDER — BUSPIRONE HCL 15 MG PO TABS: 15.0000 mg | ORAL_TABLET | Freq: Two times a day (BID) | ORAL | 1 refills | Status: DC

## 2023-01-20 NOTE — Patient Instructions (Addendum)
REDUCE YOUR SERTRALINE DOSE TO 100 MG DAILY STARTING TODAY.  AFTER ONE WEEK REDUCE THE  DOSE TO 50 MG DAILY FOR ONE WEEK,  THEN STOP TAKING IT    START THE SAVELLA PACK .  THE DOSE IS RAPIDLY TITRATED FROM 12.5 MG DAILY TO 50 MG TWICE DAILY (OVER ONE WEEK)  YOU SHOULD STOP THE SERTRALINE DOSE IMMEDIATELY  IF YOU START TO HAVE ANY ADVERSE SIDE EFFECTS:  RAPID HEART RATE,  TREMOR,  SWEATING.    CONTINUE 5 MG AMLODIPINE AND 25 MG METOPROLOL .  IF THE BLOOD PRESSURE REQUIRES MORE CONTROL WE WILL CONSIDER ADDING TELMISARTAN

## 2023-01-20 NOTE — Progress Notes (Unsigned)
Subjective:  Patient ID: Allison Alvarez, female    DOB: [birth year]  Age: 62 y.o. MRN: [id number]  CC: {There were no encounter diagnoses. (Refresh or delete this SmartLink)}   HPI Sharonn Kilmer presents for  Chief Complaint  Patient presents with   Medical Management of Chronic Issues    4 week follow up on blood pressure   1) HTN:  restarted on amlodipine 25 mg  several weeks ago AFTER SUSPENSION due to hypotension in setting of GI bleed.   Patient increased dose to 5 g daily on oct 8 for persistently elevated  readings as high as  165/106 last evening . Also taking metoprolol 25 mg bid.  No prior urine test for protein   2) GAD  taking 150 mg sertrline   Fibromyalgia acting up,  wants a trial of savella  a   Outpatient Medications Prior to Visit  Medication Sig Dispense Refill   acetaminophen (TYLENOL) 325 MG tablet Take 650 mg by mouth every 6 (six) hours as needed for moderate pain.     ALPRAZolam (XANAX) 0.25 MG tablet Take 1 tablet (0.25 mg total) by mouth at bedtime as needed for anxiety. 30 tablet 2   amLODipine (NORVASC) 2.5 MG tablet Take 1 tablet (2.5 mg total) by mouth daily. 90 tablet 1   amphetamine-dextroamphetamine (ADDERALL XR) 25 MG 24 hr capsule Take 1 capsule by mouth daily. 90 capsule 0   busPIRone (BUSPAR) 15 MG tablet TAKE 1 TABLET BY MOUTH 2 TIMES A DAY 180 tablet 1   cyanocobalamin (VITAMIN B12) 1000 MCG/ML injection Inject 1 mL (1,000 mcg total) into the muscle every 30 (thirty) days. 1 mL 11   doxycycline (VIBRAMYCIN) 50 MG capsule Take 1 capsule (50 mg total) by mouth daily. 90 capsule 1   gabapentin (NEURONTIN) 100 MG capsule Take 1 capsule (100 mg total) by mouth 2 (two) times daily. 60 capsule 0   gabapentin (NEURONTIN) 300 MG capsule TAKE ONE CAPSULE BY MOUTH EVERY EVENING 30 capsule 3   metoprolol tartrate (LOPRESSOR) 25 MG tablet Take 1 tablet (25 mg total) by mouth 2 (two) times daily. 180 tablet 1   ondansetron (ZOFRAN-ODT) 4 MG disintegrating tablet  Take 1 tablet (4 mg total) by mouth every 8 (eight) hours as needed for nausea or vomiting. 20 tablet 0   pantoprazole (PROTONIX) 40 MG tablet TAKE 1 TABLET BY MOUTH TWICE A DAY 180 tablet 3   rOPINIRole (REQUIP) 0.25 MG tablet One tablet after dinner , repeat at bedtime.  Increase weekly as needed 90 tablet 1   rosuvastatin (CRESTOR) 10 MG tablet TAKE 1 TABLET BY MOUTH DAILY 90 tablet 3   sertraline (ZOLOFT) 100 MG tablet Take 1.5 tablets (150 mg total) by mouth daily. 45 tablet 5   No facility-administered medications prior to visit.    Review of Systems;  Patient denies headache, fevers, malaise, unintentional weight loss, skin rash, eye pain, sinus congestion and sinus pain, sore throat, dysphagia,  hemoptysis , cough, dyspnea, wheezing, chest pain, palpitations, orthopnea, edema, abdominal pain, nausea, melena, diarrhea, constipation, flank pain, dysuria, hematuria, urinary  Frequency, nocturia, numbness, tingling, seizures,  Focal weakness, Loss of consciousness,  Tremor, insomnia, depression, anxiety, and suicidal ideation.      Objective:  There were no vitals taken for this visit.  BP Readings from Last 3 Encounters:  12/23/22 116/74  09/15/22 (!) 140/80  07/24/22 (!) 150/91    Wt Readings from Last 3 Encounters:  12/23/22 156 lb 6.4 oz (70.9 kg)  09/15/22 156 lb 1.9 oz (70.8 kg)  07/23/22 156 lb (70.8 kg)    Physical Exam  Lab Results  Component Value Date   HGBA1C 5.5 09/17/2021    Lab Results  Component Value Date   CREATININE 0.90 09/15/2022   CREATININE 0.79 07/23/2022   CREATININE 0.83 07/22/2022    Lab Results  Component Value Date   WBC 5.7 10/06/2022   HGB 10.6 (L) 10/06/2022   HCT 32.6 (L) 10/06/2022   PLT 271.0 10/06/2022   GLUCOSE 103 (H) 09/15/2022   CHOL 200 09/15/2022   TRIG 118.0 09/15/2022   HDL 50.40 09/15/2022   LDLCALC 126 (H) 09/15/2022   ALT 15 10/06/2022   AST 17 10/06/2022   NA 137 09/15/2022   K 3.8 09/15/2022   CL 103  09/15/2022   CREATININE 0.90 09/15/2022   BUN 13 09/15/2022   CO2 28 09/15/2022   TSH 1.37 10/12/2022   INR 1.1 04/12/2022   HGBA1C 5.5 09/17/2021    CT ABDOMEN PELVIS W CONTRAST  Result Date: 07/23/2022 CLINICAL DATA:  Right-sided abdominal pain. EXAM: CT ABDOMEN AND PELVIS WITH CONTRAST TECHNIQUE: Multidetector CT imaging of the abdomen and pelvis was performed using the standard protocol following bolus administration of intravenous contrast. RADIATION DOSE REDUCTION: This exam was performed according to the departmental dose-optimization program which includes automated exposure control, adjustment of the mA and/or kV according to patient size and/or use of iterative reconstruction technique. CONTRAST:  OMNIPAQUE IOHEXOL 300 MG/ML  SOLN COMPARISON:  April 23, 2022 and January 16, 2015 FINDINGS: Lower chest: No acute abnormality. Hepatobiliary: There is diffuse fatty infiltration of the liver parenchyma. No focal liver abnormality is seen. Status post cholecystectomy. No biliary dilatation. Pancreas: Unremarkable. No pancreatic ductal dilatation or surrounding inflammatory changes. Spleen: Normal in size without focal abnormality. Adrenals/Urinary Tract: Adrenal glands are unremarkable. Kidneys are normal, without renal calculi, focal lesion, or hydronephrosis. Stable, well-defined hyperdense foci are seen just below the base of an otherwise normal appearing urinary bladder (axial CT images 80 through 84, CT series 2). These are seen within the region surrounding the proximal urethra. Stomach/Bowel: Stomach is within normal limits. The appendix is surgically absent. Surgically anastomosed bowel is seen within the region of the mid sigmoid colon. No evidence of bowel wall thickening, distention, or inflammatory changes. Noninflamed diverticula are seen throughout the descending colon. Vascular/Lymphatic: Aortic atherosclerosis. No enlarged abdominal or pelvic lymph nodes. Reproductive: Status  post hysterectomy. No adnexal masses. Other: No abdominal wall hernia or abnormality. No abdominopelvic ascites. Musculoskeletal: No acute or significant osseous findings. IMPRESSION: 1. Hepatic steatosis. 2. Evidence of prior cholecystectomy, appendectomy and hysterectomy. 3. Stable findings which may represent debris within urethral diverticula. 4. Colonic diverticulosis. 5. Aortic atherosclerosis. Aortic Atherosclerosis (ICD10-I70.0). Electronically Signed   By: Aram Candela M.D.   On: 07/23/2022 23:13   DG Chest 2 View  Result Date: 07/23/2022 CLINICAL DATA:  Cough and right flank pain EXAM: CHEST - 2 VIEW COMPARISON:  04/23/2022, 02/23/2018 FINDINGS: Bronchitic changes. Mild irregular left lower lung opacity. Right lung shows no focal airspace disease. No pleural effusion or pneumothorax. Normal cardiomediastinal silhouette. IMPRESSION: Bronchitic changes with mild irregular opacity at the left base which may be due to small focus of pneumonia. Short interval radiographic follow-up is recommended. Electronically Signed   By: Jasmine Pang M.D.   On: 07/23/2022 22:18    Assessment & Plan:  .There are no diagnoses linked to this encounter.   I provided 30 minutes of face-to-face time  during this encounter reviewing patient's last visit with me, patient's  most recent visit with cardiology,  nephrology,  and neurology,  recent surgical and non surgical procedures, previous  labs and imaging studies, counseling on currently addressed issues,  and post visit ordering to diagnostics and therapeutics .   Follow-up: No follow-ups on file.   Sherlene Shams, MD

## 2023-01-21 LAB — MICROALBUMIN / CREATININE URINE RATIO
Creatinine,U: 72.1 mg/dL
Microalb Creat Ratio: 2.9 mg/g (ref 0.0–30.0)
Microalb, Ur: 2.1 mg/dL — ABNORMAL HIGH (ref 0.0–1.9)

## 2023-01-21 MED ORDER — TELMISARTAN 40 MG PO TABS: 40.0000 mg | ORAL_TABLET | Freq: Every day | ORAL | 2 refills | Status: DC

## 2023-01-21 NOTE — Assessment & Plan Note (Signed)
Initiating treatment with Savella ,  stopping  sertraline.

## 2023-01-21 NOTE — Assessment & Plan Note (Signed)
Continue 5 mg daily  ,  will add ARB given evidence of proteinuria   Lab Results  Component Value Date   MICROALBUR 2.1 (H) 01/20/2023

## 2023-01-28 ENCOUNTER — Other Ambulatory Visit: Payer: BC Managed Care – PPO

## 2023-01-29 ENCOUNTER — Other Ambulatory Visit (INDEPENDENT_AMBULATORY_CARE_PROVIDER_SITE_OTHER): Payer: BC Managed Care – PPO

## 2023-01-29 DIAGNOSIS — I10 Essential (primary) hypertension: Secondary | ICD-10-CM

## 2023-01-29 LAB — BASIC METABOLIC PANEL
BUN: 14 mg/dL (ref 6–23)
CO2: 28 meq/L (ref 19–32)
Calcium: 9.3 mg/dL (ref 8.4–10.5)
Chloride: 101 meq/L (ref 96–112)
Creatinine, Ser: 0.84 mg/dL (ref 0.40–1.20)
GFR: 75.38 mL/min (ref >60.00)
Glucose, Bld: 116 mg/dL — ABNORMAL HIGH (ref 70–99)
Potassium: 3.5 meq/L (ref 3.5–5.1)
Sodium: 138 meq/L (ref 135–145)

## 2023-01-31 ENCOUNTER — Encounter: Payer: Self-pay | Admitting: Internal Medicine

## 2023-02-01 ENCOUNTER — Other Ambulatory Visit: Payer: Self-pay | Admitting: Internal Medicine

## 2023-02-01 MED ORDER — TRAZODONE HCL 150 MG PO TABS: 75.0000 mg | ORAL_TABLET | Freq: Every day | ORAL | 3 refills | Status: DC

## 2023-02-07 ENCOUNTER — Other Ambulatory Visit: Payer: Self-pay | Admitting: Internal Medicine

## 2023-02-14 ENCOUNTER — Encounter: Payer: Self-pay | Admitting: Internal Medicine

## 2023-02-15 MED ORDER — SAVELLA 100 MG PO TABS: 100.0000 mg | ORAL_TABLET | Freq: Two times a day (BID) | ORAL | 2 refills | Status: DC

## 2023-03-07 ENCOUNTER — Other Ambulatory Visit: Payer: Self-pay | Admitting: Internal Medicine

## 2023-03-19 ENCOUNTER — Other Ambulatory Visit: Payer: Self-pay | Admitting: Internal Medicine

## 2023-04-02 DIAGNOSIS — S92912A Unspecified fracture of left toe(s), initial encounter for closed fracture: Secondary | ICD-10-CM | POA: Diagnosis not present

## 2023-04-11 ENCOUNTER — Other Ambulatory Visit: Payer: Self-pay | Admitting: Family Medicine

## 2023-04-11 ENCOUNTER — Other Ambulatory Visit: Payer: Self-pay | Admitting: Internal Medicine

## 2023-04-12 NOTE — Telephone Encounter (Signed)
Last OV 02/20/22 - it has been > 1 year since last visit Next OV not scheduled  Last refill 01/12/23 Qty #90/0  Controlled substance, forwarding to Dr. [name].

## 2023-04-13 MED ORDER — AMPHETAMINE-DEXTROAMPHET ER 25 MG PO CP24: 25.0000 mg | ORAL_CAPSULE | Freq: Every day | ORAL | 0 refills | Status: DC

## 2023-04-29 ENCOUNTER — Other Ambulatory Visit: Payer: Self-pay | Admitting: Internal Medicine

## 2023-05-08 ENCOUNTER — Other Ambulatory Visit: Payer: Self-pay | Admitting: Internal Medicine

## 2023-05-10 ENCOUNTER — Encounter: Payer: Self-pay | Admitting: Internal Medicine

## 2023-05-11 NOTE — Telephone Encounter (Signed)
Dr. Darrick Huntsman is accepting pt's daughter and son in law as new patients. Can you set them up for a new pt appt.

## 2023-05-13 NOTE — Telephone Encounter (Signed)
MyChart message sent to patient asking for names, phone numbers so her family can be scheduled with Dr Darrick Huntsman.

## 2023-05-24 ENCOUNTER — Ambulatory Visit: Payer: BC Managed Care – PPO | Admitting: Family Medicine

## 2023-05-24 ENCOUNTER — Encounter: Payer: Self-pay | Admitting: Internal Medicine

## 2023-05-24 NOTE — Telephone Encounter (Signed)
 Spoke with pt and scheduled her for an appointment today with Dr. Lovetta Rucks.

## 2023-05-25 ENCOUNTER — Encounter: Payer: Self-pay | Admitting: Family Medicine

## 2023-05-25 ENCOUNTER — Ambulatory Visit: Payer: BC Managed Care – PPO | Admitting: Family Medicine

## 2023-05-25 VITALS — BP 130/84 | HR 108 | Temp 98.9°F | Wt 168.8 lb

## 2023-05-25 DIAGNOSIS — M5430 Sciatica, unspecified side: Secondary | ICD-10-CM

## 2023-05-25 DIAGNOSIS — W19XXXD Unspecified fall, subsequent encounter: Secondary | ICD-10-CM

## 2023-05-25 DIAGNOSIS — M25562 Pain in left knee: Secondary | ICD-10-CM | POA: Diagnosis not present

## 2023-05-25 DIAGNOSIS — F411 Generalized anxiety disorder: Secondary | ICD-10-CM

## 2023-05-25 DIAGNOSIS — G8929 Other chronic pain: Secondary | ICD-10-CM | POA: Diagnosis not present

## 2023-05-25 MED ORDER — PREDNISONE 20 MG PO TABS: 40.0000 mg | ORAL_TABLET | Freq: Every day | ORAL | 0 refills | Status: DC

## 2023-05-25 NOTE — Assessment & Plan Note (Signed)
Chronic issue with acute flare.  Suspect nerve impingement in her back.  Will treat with prednisone 40 mg daily for 5 days.  She will be given exercises to complete at home as she did not feel that she would have the time for PT.  If the exercises cause pain or are not beneficial we can then refer her for physical therapy.  Discussed risk of agitation and sleep issues with the prednisone.

## 2023-05-25 NOTE — Patient Instructions (Signed)
Back Exercises The following exercises strengthen the muscles that help to support the trunk (torso) and back. They also help to keep the lower back flexible. Doing these exercises can help to prevent or lessen existing low back pain. If you have back pain or discomfort, try doing these exercises 2-3 times each day or as told by your health care provider. As your pain improves, do them once each day, but increase the number of times that you repeat the steps for each exercise (do more repetitions). To prevent the recurrence of back pain, continue to do these exercises once each day or as told by your health care provider. Do exercises exactly as told by your health care provider and adjust them as directed. It is normal to feel mild stretching, pulling, tightness, or discomfort as you do these exercises, but you should stop right away if you feel sudden pain or your pain gets worse. Exercises Single knee to chest Repeat these steps 3-5 times for each leg: Lie on your back on a firm bed or the floor with your legs extended. Bring one knee to your chest. Your other leg should stay extended and in contact with the floor. Hold your knee in place by grabbing your knee or thigh with both hands and hold. Pull on your knee until you feel a gentle stretch in your lower back or buttocks. Hold the stretch for 10-30 seconds. Slowly release and straighten your leg.  Pelvic tilt Repeat these steps 5-10 times: Lie on your back on a firm bed or the floor with your legs extended. Bend your knees so they are pointing toward the ceiling and your feet are flat on the floor. Tighten your lower abdominal muscles to press your lower back against the floor. This motion will tilt your pelvis so your tailbone points up toward the ceiling instead of pointing to your feet or the floor. With gentle tension and even breathing, hold this position for 5-10 seconds.  Cat-cow Repeat these steps until your lower back becomes  more flexible: Get into a hands-and-knees position on a firm bed or the floor. Keep your hands under your shoulders, and keep your knees under your hips. You may place padding under your knees for comfort. Let your head hang down toward your chest. Contract your abdominal muscles and point your tailbone toward the floor so your lower back becomes rounded like the back of a cat. Hold this position for 5 seconds. Slowly lift your head, let your abdominal muscles relax, and point your tailbone up toward the ceiling so your back forms a sagging arch like the back of a cow. Hold this position for 5 seconds.  Press-ups Repeat these steps 5-10 times: Lie on your abdomen (face-down) on a firm bed or the floor. Place your palms near your head, about shoulder-width apart. Keeping your back as relaxed as possible and keeping your hips on the floor, slowly straighten your arms to raise the top half of your body and lift your shoulders. Do not use your back muscles to raise your upper torso. You may adjust the placement of your hands to make yourself more comfortable. Hold this position for 5 seconds while you keep your back relaxed. Slowly return to lying flat on the floor.  Bridges Repeat these steps 10 times: Lie on your back on a firm bed or the floor. Bend your knees so they are pointing toward the ceiling and your feet are flat on the floor. Your arms should be flat  at your sides, next to your body. Tighten your buttocks muscles and lift your buttocks off the floor until your waist is at almost the same height as your knees. You should feel the muscles working in your buttocks and the back of your thighs. If you do not feel these muscles, slide your feet 1-2 inches (2.5-5 cm) farther away from your buttocks. Hold this position for 3-5 seconds. Slowly lower your hips to the starting position, and allow your buttocks muscles to relax completely. If this exercise is too easy, try doing it with your arms  crossed over your chest. Back lifts Repeat these steps 5-10 times: Lie on your abdomen (face-down) with your arms at your sides, and rest your forehead on the floor. Tighten the muscles in your legs and your buttocks. Slowly lift your chest off the floor while you keep your hips pressed to the floor. Keep the back of your head in line with the curve in your back. Your eyes should be looking at the floor. Hold this position for 3-5 seconds. Slowly return to your starting position.  Contact a health care provider if: Your back pain or discomfort gets much worse when you do an exercise. Your worsening back pain or discomfort does not lessen within 2 hours after you exercise. If you have any of these problems, stop doing these exercises right away. Do not do them again unless your health care provider says that you can. Get help right away if: You develop sudden, severe back pain. If this happens, stop doing the exercises right away. Do not do them again unless your health care provider says that you can. This information is not intended to replace advice given to you by your health care provider. Make sure you discuss any questions you have with your health care provider. Document Revised: 05/03/2022 Document Reviewed: 06/12/2020 Elsevier Patient Education  2024 [employer].

## 2023-05-25 NOTE — Progress Notes (Signed)
Marikay Alar, MD Phone: [phone]  Allison Alvarez is a 63 y.o. female who presents today for same-day visit.  Back pain: Patient notes low back pain has been going on 3 weeks.  She notes it started after she ran into her garage door that was partially down and then fell backwards onto her buttocks.  She notes she did hit her head against the garage door.  Not on blood thinners.  No syncope.  She notes she felt okay initially though within a week she started to have sciatic issues on the right side.  Gets low back pain shooting down her right posterior thigh.  No numbness, weakness, or incontinence.  Has been taking some Tylenol.  Left knee pain: This is an ongoing issue for 3+ months.  Notes has been worse since the sciatica onset.  Notes it is a sharp discomfort that occurs intermittently particularly when going down steps.  Notes catching and locking.  Notes it does give out on her.  No specific injury.  Anxiety and depression: This is a chronic issue that has gotten worse.  She notes it is related to the grief of losing her stepdad back in August.  She has less joy in things.  Does report some anger with this.  No SI.  She is on buspirone, Xanax, trazodone, and Savella.  She has a Child psychotherapist know she should probably be seeing a therapist for this issue.  Social History   Tobacco Use  Smoking Status Former   Current packs/day: 0.00   Types: Cigarettes   Quit date: 01/2020   Years since quitting: 3.3  Smokeless Tobacco Never    Current Outpatient Medications on File Prior to Visit  Medication Sig Dispense Refill   acetaminophen (TYLENOL) 325 MG tablet Take 650 mg by mouth every 6 (six) hours as needed for moderate pain.     ALPRAZolam (XANAX) 0.25 MG tablet TAKE ONE TABLET BY MOUTH TWICE A DAY AS NEEDED FOR ANXIETY 60 tablet 2   amLODipine (NORVASC) 5 MG tablet Take 1 tablet (5 mg total) by mouth daily. 90 tablet 1   amphetamine-dextroamphetamine (ADDERALL XR) 25 MG 24 hr  capsule Take 1 capsule by mouth daily. 90 capsule 0   busPIRone (BUSPAR) 15 MG tablet Take 1 tablet (15 mg total) by mouth 2 (two) times daily. 180 tablet 1   cyanocobalamin (VITAMIN B12) 1000 MCG/ML injection Inject 1 mL (1,000 mcg total) into the muscle every 30 (thirty) days. 1 mL 11   doxycycline (VIBRAMYCIN) 50 MG capsule TAKE 1 CAPSULE BY MOUTH DAILY 90 capsule 1   gabapentin (NEURONTIN) 100 MG capsule TAKE 1 CAPSULE BY MOUTH 2 TIMES A DAY 60 capsule 0   gabapentin (NEURONTIN) 300 MG capsule TAKE ONE CAPSULE BY MOUTH EVERY EVENING 30 capsule 3   metoprolol tartrate (LOPRESSOR) 25 MG tablet Take 1 tablet (25 mg total) by mouth 2 (two) times daily. 180 tablet 1   Milnacipran HCl (SAVELLA) 100 MG TABS tablet Take 1 tablet (100 mg total) by mouth 2 (two) times daily. 60 tablet 2   ondansetron (ZOFRAN-ODT) 4 MG disintegrating tablet Take 1 tablet (4 mg total) by mouth every 8 (eight) hours as needed for nausea or vomiting. 20 tablet 0   pantoprazole (PROTONIX) 40 MG tablet TAKE 1 TABLET BY MOUTH TWICE A DAY 180 tablet 3   rOPINIRole (REQUIP) 0.25 MG tablet TAKE 1 TABLET BY MOUTH DAILY AFTER DINNER, THEN TAKE 1 TABLET BY MOUTH AT BEDTIME *INCREASE WEEKLY AS NEEDED* 90  tablet 1   rosuvastatin (CRESTOR) 10 MG tablet TAKE 1 TABLET BY MOUTH DAILY 90 tablet 3   telmisartan (MICARDIS) 40 MG tablet TAKE 1 TABLET BY MOUTH DAILY 90 tablet 1   traZODone (DESYREL) 150 MG tablet Take 0.5 tablets (75 mg total) by mouth at bedtime. 90 tablet 3   No current facility-administered medications on file prior to visit.     ROS see history of present illness  Objective  Physical Exam Vitals:   05/25/23 0936 05/25/23 0952  BP: (!) 146/90 130/84  Pulse: (!) 108   Temp: 98.9 F (37.2 C)   SpO2: 99%     BP Readings from Last 3 Encounters:  05/25/23 130/84  01/20/23 (!) 142/90  12/23/22 116/74   Wt Readings from Last 3 Encounters:  05/25/23 168 lb 12.8 oz (76.6 kg)  01/20/23 159 lb 6.4 oz (72.3 kg)   12/23/22 156 lb 6.4 oz (70.9 kg)    Physical Exam Constitutional:      General: She is not in acute distress.    Appearance: She is not diaphoretic.  Cardiovascular:     Rate and Rhythm: Normal rate and regular rhythm.     Heart sounds: Normal heart sounds.  Pulmonary:     Effort: Pulmonary effort is normal.     Breath sounds: Normal breath sounds.  Musculoskeletal:     Comments: No midline spine tenderness, no midline spine step-off, there is some lumbar muscular back tenderness, left knee with no tenderness, negative McMurray's left knee  Skin:    General: Skin is warm and dry.  Neurological:     Mental Status: She is alert.     Comments: 5/5 strength bilateral quads, hamstrings, plantarflexion, and dorsiflexion, sensation to light touch intact bilateral lower extremities      Assessment/Plan: Please see individual problem list.  Sciatica, unspecified laterality Assessment & Plan: Chronic issue with acute flare.  Suspect nerve impingement in her back.  Will treat with prednisone 40 mg daily for 5 days.  She will be given exercises to complete at home as she did not feel that she would have the time for PT.  If the exercises cause pain or are not beneficial we can then refer her for physical therapy.  Discussed risk of agitation and sleep issues with the prednisone.  Orders: -     predniSONE; Take 2 tablets (40 mg total) by mouth daily with breakfast.  Dispense: 10 tablet; Refill: 0  Chronic pain of left knee Assessment & Plan: Chronic issue.  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  Will refer to orthopedics.  Orders: -     Ambulatory referral to Orthopedic Surgery  GAD (generalized anxiety disorder) Assessment & Plan: Chronic issue.  Patient with anxiety and depression.  Has had some worsening of her symptoms.  I discussed seeing a therapist would likely be beneficial.  She notes she can navigate that process on her own.  She will continue buspirone 15 mg twice daily and  trazodone 75 mg nightly as needed for sleep.  Patient does have Xanax and Savella prescribed by her PCP.  She will continue those for now as well.    Return if symptoms worsen or fail to improve.   Marikay Alar, MD [facility]

## 2023-05-25 NOTE — Assessment & Plan Note (Signed)
Chronic issue.  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  Will refer to orthopedics.

## 2023-05-25 NOTE — Assessment & Plan Note (Signed)
Chronic issue.  Patient with anxiety and depression.  Has had some worsening of her symptoms.  I discussed seeing a therapist would likely be beneficial.  She notes she can navigate that process on her own.  She will continue buspirone 15 mg twice daily and trazodone 75 mg nightly as needed for sleep.  Patient does have Xanax and Savella prescribed by her PCP.  She will continue those for now as well.

## 2023-06-04 ENCOUNTER — Other Ambulatory Visit: Payer: Self-pay | Admitting: Internal Medicine

## 2023-06-07 ENCOUNTER — Ambulatory Visit: Payer: BC Managed Care – PPO

## 2023-06-07 ENCOUNTER — Ambulatory Visit: Payer: BC Managed Care – PPO | Admitting: Internal Medicine

## 2023-06-07 VITALS — BP 114/78 | HR 87 | Temp 97.9°F | Ht 68.0 in | Wt 170.0 lb

## 2023-06-07 DIAGNOSIS — M25561 Pain in right knee: Secondary | ICD-10-CM

## 2023-06-07 DIAGNOSIS — M25562 Pain in left knee: Secondary | ICD-10-CM

## 2023-06-07 DIAGNOSIS — M5431 Sciatica, right side: Secondary | ICD-10-CM

## 2023-06-07 DIAGNOSIS — G8929 Other chronic pain: Secondary | ICD-10-CM | POA: Diagnosis not present

## 2023-06-07 MED ORDER — DICLOFENAC SODIUM 1 % EX GEL
4.0000 g | Freq: Four times a day (QID) | CUTANEOUS | 0 refills | Status: AC
Start: 2023-06-07 — End: ?

## 2023-06-07 NOTE — Assessment & Plan Note (Signed)
-  This is a chronic issue.  She was referred to orthopedics by Dr. Birdie Sons at her last visit -Patient states that she is yet to receive a call from orthopedics since that visit -Will have our office reach out to orthopedics again to see if they can help get her scheduled

## 2023-06-07 NOTE — Telephone Encounter (Signed)
 Scheduled Patient with Dr. Heide Spark for 1:40 today.

## 2023-06-07 NOTE — Patient Instructions (Signed)
-  It was a pleasure meeting you today -I have put in for an x-ray of your right knee and prescribed Voltaren gel for you.  Use this up to 4 times a day as needed for pain in your right knee -Please follow-up with orthopedics for workup of your left knee pain -I will refer you to physical therapy for your lower back pain.  Please follow-up with them as I believe this will help with your symptoms -Please call us with any questions or concerns or if any your symptoms worsen

## 2023-06-07 NOTE — Assessment & Plan Note (Signed)
-  Patient states that she has pain around her right knee since her fall -This appears to be separate from her sciatica-like pain -Will obtain an x-ray of her right knee for further evaluation -Voltaren gel prescribed to see if this will help with her pain -No further workup at this time

## 2023-06-07 NOTE — Progress Notes (Signed)
 Acute Office Visit  Subjective:     Patient ID: Allison Alvarez, female    DOB: 12-30-1960, 63 y.o.   MRN: [id number]  Chief Complaint  Patient presents with   Acute Visit    Pain in right lower back that radiates into the right hip and right knee even when sitting still Pain 5/10    HPI Patient is in today for recurrent back pain as well as right knee pain.  Patient states that she was seen in our clinic 1 week ago after a recent fall in her garage for worsening right-sided back pain that was radiating down to the middle of her leg.  She was also seen for her chronic left knee pain.  She was referred to orthopedics at that visit for her left knee pain for possible meniscal tear.  She was also prescribed prednisone for possible sciatica.  Patient states that her symptoms initially improved but then returned.  Patient states that she currently has 5 out of 10 right-sided back pain that radiates down to the middle of her leg.  She also complains of associated right-sided knee pain over the anterior aspect of her knee.  No fevers or chills.  No focal weakness.  No tingling or numbness.  No bowel or bladder incontinence.  Review of Systems  Constitutional: Negative.   HENT: Negative.    Respiratory: Negative.    Cardiovascular: Negative.   Musculoskeletal:  Positive for back pain and joint pain.       Patient with right-sided back pain radiating down to the middle of her right leg.  She also complains of right-sided knee pain  Neurological: Negative.   Psychiatric/Behavioral: Negative.          Objective:    BP 114/78   Pulse 87   Temp 97.9 F (36.6 C)   Ht 5\' 8"  (1.727 m)   Wt 170 lb (77.1 kg)   SpO2 98%   BMI 25.85 kg/m     Physical Exam Constitutional:      Appearance: Normal appearance.  HENT:     Head: Normocephalic and atraumatic.  Cardiovascular:     Rate and Rhythm: Normal rate and regular rhythm.     Heart sounds: Normal heart sounds.  Pulmonary:     Effort:  Pulmonary effort is normal. No respiratory distress.     Breath sounds: Normal breath sounds. No wheezing.  Musculoskeletal:     Lumbar back: No swelling, edema or deformity. Positive right straight leg raise test. Negative left straight leg raise test.     Right knee: No swelling or deformity. No tenderness.  Neurological:     Mental Status: She is alert.     No results found for any visits on 06/07/23.      Assessment & Plan:   Problem List Items Addressed This Visit       Nervous and Auditory   Sciatica   -Patient with recurrent right-sided lower back pain radiating to the middle of her right leg. -Patient states that she has pain even while sitting down and is currently 5 out of 10 -Given that the pain is not radiating all the way down to her ankle her pain may actually represent piriformis syndrome -Pain also appears to be more anterior and lateral over the leg as opposed to along the posterior aspect -Pain did improve with prednisone but then recurred -I do not believe that a recurrent course of prednisone would be helpful at this time -Patient is also  already on maximally tolerated dose of gabapentin.  Will not increase the dose at this time -Patient is also on Savella so we will not prescribe Cymbalta at this time  -Patient will likely benefit from physical therapy -Ambulatory referral to physical therapy given -No further workup for now      Relevant Orders   Ambulatory referral to Physical Therapy     Other   Chronic pain of left knee - Primary   -This is a chronic issue.  She was referred to orthopedics by Dr. Birdie Sons at her last visit -Patient states that she is yet to receive a call from orthopedics since that visit -Will have our office reach out to orthopedics again to see if they can help get her scheduled      Knee pain, right   -Patient states that she has pain around her right knee since her fall -This appears to be separate from her sciatica-like  pain -Will obtain an x-ray of her right knee for further evaluation -Voltaren gel prescribed to see if this will help with her pain -No further workup at this time      Relevant Medications   diclofenac Sodium (VOLTAREN ARTHRITIS PAIN) 1 % GEL   Other Relevant Orders   DG Knee Complete 4 Views Right   Ambulatory referral to Physical Therapy    Meds ordered this encounter  Medications   diclofenac Sodium (VOLTAREN ARTHRITIS PAIN) 1 % GEL    Sig: Apply 4 g topically 4 (four) times daily.    Dispense:  150 g    Refill:  0    No follow-ups on file.  Earl Lagos, MD

## 2023-06-07 NOTE — Assessment & Plan Note (Signed)
-  Patient with recurrent right-sided lower back pain radiating to the middle of her right leg. -Patient states that she has pain even while sitting down and is currently 5 out of 10 -Given that the pain is not radiating all the way down to her ankle her pain may actually represent piriformis syndrome -Pain also appears to be more anterior and lateral over the leg as opposed to along the posterior aspect -Pain did improve with prednisone but then recurred -I do not believe that a recurrent course of prednisone would be helpful at this time -Patient is also already on maximally tolerated dose of gabapentin.  Will not increase the dose at this time -Patient is also on Savella so we will not prescribe Cymbalta at this time  -Patient will likely benefit from physical therapy -Ambulatory referral to physical therapy given -No further workup for now

## 2023-06-16 ENCOUNTER — Encounter: Payer: Self-pay | Admitting: Internal Medicine

## 2023-06-19 ENCOUNTER — Other Ambulatory Visit: Payer: Self-pay | Admitting: Internal Medicine

## 2023-06-23 ENCOUNTER — Telehealth: Payer: Self-pay

## 2023-06-23 DIAGNOSIS — M79672 Pain in left foot: Secondary | ICD-10-CM | POA: Diagnosis not present

## 2023-06-23 DIAGNOSIS — M25572 Pain in left ankle and joints of left foot: Secondary | ICD-10-CM | POA: Diagnosis not present

## 2023-06-23 DIAGNOSIS — M7989 Other specified soft tissue disorders: Secondary | ICD-10-CM | POA: Diagnosis not present

## 2023-06-23 DIAGNOSIS — S93402A Sprain of unspecified ligament of left ankle, initial encounter: Secondary | ICD-10-CM | POA: Diagnosis not present

## 2023-06-23 DIAGNOSIS — S93602A Unspecified sprain of left foot, initial encounter: Secondary | ICD-10-CM | POA: Diagnosis not present

## 2023-06-23 DIAGNOSIS — M25472 Effusion, left ankle: Secondary | ICD-10-CM | POA: Diagnosis not present

## 2023-06-23 NOTE — Telephone Encounter (Signed)
 Called the Radiology Reading Room to get this Patient's x ray results from 06/07/23. Radiology Reading Room states they will change it to STAT and have the results sent right over.

## 2023-06-30 DIAGNOSIS — S92222A Displaced fracture of lateral cuneiform of left foot, initial encounter for closed fracture: Secondary | ICD-10-CM | POA: Diagnosis not present

## 2023-07-03 ENCOUNTER — Other Ambulatory Visit: Payer: Self-pay | Admitting: Internal Medicine

## 2023-07-11 ENCOUNTER — Other Ambulatory Visit: Payer: Self-pay | Admitting: Family Medicine

## 2023-07-12 NOTE — Telephone Encounter (Signed)
 Last OV 02/20/22 Next OV not scheduled

## 2023-07-14 MED ORDER — AMPHETAMINE-DEXTROAMPHET ER 25 MG PO CP24
25.0000 mg | ORAL_CAPSULE | Freq: Every day | ORAL | 0 refills | Status: DC
Start: 2023-07-14 — End: 2023-10-12

## 2023-07-15 DIAGNOSIS — S93602A Unspecified sprain of left foot, initial encounter: Secondary | ICD-10-CM | POA: Diagnosis not present

## 2023-07-15 DIAGNOSIS — S92222A Displaced fracture of lateral cuneiform of left foot, initial encounter for closed fracture: Secondary | ICD-10-CM | POA: Diagnosis not present

## 2023-08-02 ENCOUNTER — Encounter: Admitting: Internal Medicine

## 2023-08-03 ENCOUNTER — Other Ambulatory Visit: Payer: Self-pay | Admitting: Internal Medicine

## 2023-08-07 ENCOUNTER — Other Ambulatory Visit: Payer: Self-pay | Admitting: Internal Medicine

## 2023-08-19 ENCOUNTER — Other Ambulatory Visit: Payer: Self-pay | Admitting: Internal Medicine

## 2023-08-19 ENCOUNTER — Encounter: Payer: Self-pay | Admitting: Internal Medicine

## 2023-08-25 ENCOUNTER — Other Ambulatory Visit: Payer: Self-pay

## 2023-08-25 MED ORDER — GABAPENTIN 100 MG PO CAPS: 100.0000 mg | ORAL_CAPSULE | Freq: Two times a day (BID) | ORAL | 1 refills | Status: DC

## 2023-09-01 ENCOUNTER — Other Ambulatory Visit: Payer: Self-pay | Admitting: Internal Medicine

## 2023-09-09 ENCOUNTER — Other Ambulatory Visit: Payer: Self-pay | Admitting: Internal Medicine

## 2023-09-09 NOTE — Telephone Encounter (Signed)
 Refilled: 06/04/2023 Last OV: 06/07/2023 Next OV: 09/29/2023

## 2023-09-22 ENCOUNTER — Other Ambulatory Visit: Payer: Self-pay | Admitting: Internal Medicine

## 2023-09-26 ENCOUNTER — Other Ambulatory Visit: Payer: Self-pay | Admitting: Internal Medicine

## 2023-09-27 NOTE — Telephone Encounter (Signed)
 Refilled: 04/12/2023 Last OV: 06/07/2023 Next OV: 09/29/2023

## 2023-09-29 ENCOUNTER — Ambulatory Visit (INDEPENDENT_AMBULATORY_CARE_PROVIDER_SITE_OTHER): Admitting: Internal Medicine

## 2023-09-29 ENCOUNTER — Encounter: Payer: Self-pay | Admitting: Internal Medicine

## 2023-09-29 VITALS — BP 132/82 | HR 86 | Ht 67.0 in | Wt 174.6 lb

## 2023-09-29 DIAGNOSIS — Z1231 Encounter for screening mammogram for malignant neoplasm of breast: Secondary | ICD-10-CM

## 2023-09-29 DIAGNOSIS — Z0001 Encounter for general adult medical examination with abnormal findings: Secondary | ICD-10-CM

## 2023-09-29 DIAGNOSIS — Z90710 Acquired absence of both cervix and uterus: Secondary | ICD-10-CM

## 2023-09-29 DIAGNOSIS — D649 Anemia, unspecified: Secondary | ICD-10-CM | POA: Diagnosis not present

## 2023-09-29 DIAGNOSIS — E782 Mixed hyperlipidemia: Secondary | ICD-10-CM

## 2023-09-29 DIAGNOSIS — D500 Iron deficiency anemia secondary to blood loss (chronic): Secondary | ICD-10-CM

## 2023-09-29 DIAGNOSIS — R7301 Impaired fasting glucose: Secondary | ICD-10-CM | POA: Diagnosis not present

## 2023-09-29 DIAGNOSIS — R5383 Other fatigue: Secondary | ICD-10-CM

## 2023-09-29 DIAGNOSIS — I10 Essential (primary) hypertension: Secondary | ICD-10-CM

## 2023-09-29 DIAGNOSIS — Z122 Encounter for screening for malignant neoplasm of respiratory organs: Secondary | ICD-10-CM

## 2023-09-29 DIAGNOSIS — Z800 Family history of malignant neoplasm of digestive organs: Secondary | ICD-10-CM

## 2023-09-29 DIAGNOSIS — M25551 Pain in right hip: Secondary | ICD-10-CM

## 2023-09-29 DIAGNOSIS — G8929 Other chronic pain: Secondary | ICD-10-CM

## 2023-09-29 MED ORDER — METOPROLOL TARTRATE 25 MG PO TABS: 25.0000 mg | ORAL_TABLET | Freq: Two times a day (BID) | ORAL | 0 refills | Status: DC

## 2023-09-29 MED ORDER — ROPINIROLE HCL 0.25 MG PO TABS: ORAL_TABLET | ORAL | 0 refills | Status: DC

## 2023-09-29 MED ORDER — TELMISARTAN 40 MG PO TABS: 40.0000 mg | ORAL_TABLET | Freq: Every day | ORAL | 1 refills | Status: DC

## 2023-09-29 MED ORDER — BUSPIRONE HCL 15 MG PO TABS: 15.0000 mg | ORAL_TABLET | Freq: Two times a day (BID) | ORAL | 1 refills | Status: DC

## 2023-09-29 MED ORDER — GABAPENTIN 300 MG PO CAPS: 300.0000 mg | ORAL_CAPSULE | Freq: Every evening | ORAL | 3 refills | Status: DC

## 2023-09-29 NOTE — Progress Notes (Signed)
 Patient ID: Allison Alvarez, female    DOB: October 29, 1960  Age: 63 y.o. MRN: [id number]  The patient is here for annual preventive examination and management of other chronic and acute problems.   The risk factors are reflected in the social history.   The roster of all physicians providing medical care to patient - is listed in the Snapshot section of the chart.   Activities of daily living:  The patient is 100% independent in all ADLs: dressing, toileting, feeding as well as independent mobility   Home safety : The patient has smoke detectors in the home. They wear seatbelts.  There are no unsecured firearms at home. There is no violence in the home.    There is no risks for hepatitis, STDs or HIV. There is no   history of blood transfusion. They have no travel history to infectious disease endemic areas of the world.   The patient has seen their dentist in the last six month. They have seen their eye doctor in the last year. The patinet  denies slight hearing difficulty with regard to whispered voices and some television programs.  They have deferred audiologic testing in the last year.  They do not  have excessive sun exposure. Discussed the need for sun protection: hats, long sleeves and use of sunscreen if there is significant sun exposure.    Diet: the importance of a healthy diet is discussed. They do have a healthy diet.   The benefits of regular aerobic exercise were discussed. The patient  exercises  3 to 5 days per week  for  60 minutes.    Depression screen: there are no signs or vegative symptoms of depression- irritability, change in appetite, anhedonia, sadness/tearfullness.   The following portions of the patient's history were reviewed and updated as appropriate: allergies, current medications, past family history, past medical history,  past surgical history, past social history  and problem list.   Visual acuity was not assessed per patient preference since the patient has regular  follow up with an  ophthalmologist. Hearing and body mass index were assessed and reviewed.    During the course of the visit the patient was educated and counseled about appropriate screening and preventive services including : fall prevention , diabetes screening, nutrition counseling, colorectal cancer screening, and recommended immunizations.    Chief Complaint:    Left cuneiform fracture after tripping wearing  wedge shoes . Wore the hard sole shoe for 6 weeks,  PAIN RESOLVED  Left knee problems s,  seeing Allison Alvarez  on Friday  Right sided sciatica , left medial thigh pain with walking then radiates to lateral  hip   Review of Symptoms  Patient denies headache, fevers, malaise, unintentional weight loss, skin rash, eye pain, sinus congestion and sinus pain, sore throat, dysphagia,  hemoptysis , cough, dyspnea, wheezing, chest pain, palpitations, orthopnea, edema, abdominal pain, nausea, melena, diarrhea, constipation, flank pain, dysuria, hematuria, urinary  Frequency, nocturia, numbness, tingling, seizures,  Focal weakness, Loss of consciousness,  Tremor, insomnia, depression, anxiety, and suicidal ideation.    Physical Exam:  BP 132/82   Pulse 86   Ht 5' 8 (1.727 m)   Wt 174 lb 9.6 oz (79.2 kg)   SpO2 99%   BMI 26.55 kg/m    Physical Exam Vitals reviewed.  Constitutional:      General: She is not in acute distress.    Appearance: Normal appearance. She is well-developed and normal weight. She is not ill-appearing, toxic-appearing or diaphoretic.  HENT:     Head: Normocephalic.     Right Ear: Tympanic membrane, ear canal and external ear normal. There is no impacted cerumen.     Left Ear: Tympanic membrane, ear canal and external ear normal. There is no impacted cerumen.     Nose: Nose normal.     Mouth/Throat:     Mouth: Mucous membranes are moist.     Pharynx: Oropharynx is clear.   Eyes:     General: No scleral icterus.       Right eye: No discharge.        Left  eye: No discharge.     Conjunctiva/sclera: Conjunctivae normal.     Pupils: Pupils are equal, round, and reactive to light.   Neck:     Thyroid : No thyromegaly.     Vascular: No carotid bruit or JVD.   Cardiovascular:     Rate and Rhythm: Normal rate and regular rhythm.     Heart sounds: Normal heart sounds.  Pulmonary:     Effort: Pulmonary effort is normal. No respiratory distress.     Breath sounds: Normal breath sounds.  Chest:  Breasts:    Breasts are symmetrical.     Right: Normal. No swelling, inverted nipple, mass, nipple discharge, skin change or tenderness.     Left: Normal. No swelling, inverted nipple, mass, nipple discharge, skin change or tenderness.  Abdominal:     General: Bowel sounds are normal.     Palpations: Abdomen is soft. There is no mass.     Tenderness: There is no abdominal tenderness. There is no guarding or rebound.   Musculoskeletal:        General: Normal range of motion.     Cervical back: Normal range of motion and neck supple.     Comments: Pain in right lateral hip with external rotation  and flexion to hip   Lymphadenopathy:     Cervical: No cervical adenopathy.     Upper Body:     Right upper body: No supraclavicular, axillary or pectoral adenopathy.     Left upper body: No supraclavicular, axillary or pectoral adenopathy.   Skin:    General: Skin is warm and dry.   Neurological:     General: No focal deficit present.     Mental Status: She is alert and oriented to person, place, and time. Mental status is at baseline.   Psychiatric:        Mood and Affect: Mood normal.        Behavior: Behavior normal.        Thought Content: Thought content normal.        Judgment: Judgment normal.   Assessment and Plan: Encounter for screening mammogram for malignant neoplasm of breast  Hypertension, essential  Mixed hyperlipidemia  Anemia, unspecified type  Impaired fasting glucose  Other fatigue    No follow-ups on file.  Thersia Flax, MD

## 2023-09-29 NOTE — Patient Instructions (Addendum)
 YOUR MAMMOGRAM has been ordered , PLEASE CALL AND GET THIS SCHEDULED! [facility] - call [phone]  Tony Frederickson does  the scheduling for [facility] as well     Your last colonoscopy was in 2022 by [facility] but I do not have the results.  Referral pending

## 2023-09-30 ENCOUNTER — Ambulatory Visit: Payer: Self-pay | Admitting: Internal Medicine

## 2023-09-30 ENCOUNTER — Encounter: Payer: Self-pay | Admitting: Internal Medicine

## 2023-09-30 ENCOUNTER — Telehealth: Payer: Self-pay

## 2023-09-30 ENCOUNTER — Telehealth: Payer: Self-pay | Admitting: Acute Care

## 2023-09-30 ENCOUNTER — Other Ambulatory Visit: Payer: Self-pay

## 2023-09-30 DIAGNOSIS — Z0001 Encounter for general adult medical examination with abnormal findings: Secondary | ICD-10-CM | POA: Insufficient documentation

## 2023-09-30 DIAGNOSIS — G8929 Other chronic pain: Secondary | ICD-10-CM | POA: Insufficient documentation

## 2023-09-30 DIAGNOSIS — Z87891 Personal history of nicotine dependence: Secondary | ICD-10-CM

## 2023-09-30 DIAGNOSIS — Z122 Encounter for screening for malignant neoplasm of respiratory organs: Secondary | ICD-10-CM

## 2023-09-30 LAB — MICROALBUMIN / CREATININE URINE RATIO
Creatinine,U: 100.8 mg/dL
Microalb Creat Ratio: 20.7 mg/g (ref 0.0–30.0)
Microalb, Ur: 2.1 mg/dL — ABNORMAL HIGH (ref 0.0–1.9)

## 2023-09-30 LAB — LIPID PANEL
Cholesterol: 151 mg/dL (ref 0–200)
HDL: 59.5 mg/dL (ref >39.00)
LDL Cholesterol: 66 mg/dL (ref 0–99)
NonHDL: 91.78
Total CHOL/HDL Ratio: 3
Triglycerides: 129 mg/dL (ref 0.0–149.0)
VLDL: 25.8 mg/dL (ref 0.0–40.0)

## 2023-09-30 LAB — CBC WITH DIFFERENTIAL/PLATELET
Basophils Absolute: 0.1 10*3/uL (ref 0.0–0.1)
Basophils Relative: 1.1 % (ref 0.0–3.0)
Eosinophils Absolute: 0.2 10*3/uL (ref 0.0–0.7)
Eosinophils Relative: 2.7 % (ref 0.0–5.0)
HCT: 34.8 % — ABNORMAL LOW (ref 36.0–46.0)
Hemoglobin: 11.8 g/dL — ABNORMAL LOW (ref 12.0–15.0)
Lymphocytes Relative: 39.9 % (ref 12.0–46.0)
Lymphs Abs: 2.5 10*3/uL (ref 0.7–4.0)
MCHC: 34 g/dL (ref 30.0–36.0)
MCV: 96.6 fl (ref 78.0–100.0)
Monocytes Absolute: 0.4 10*3/uL (ref 0.1–1.0)
Monocytes Relative: 5.7 % (ref 3.0–12.0)
Neutro Abs: 3.2 10*3/uL (ref 1.4–7.7)
Neutrophils Relative %: 50.6 % (ref 43.0–77.0)
Platelets: 283 10*3/uL (ref 150.0–400.0)
RBC: 3.6 Mil/uL — ABNORMAL LOW (ref 3.87–5.11)
RDW: 12.7 % (ref 11.5–15.5)
WBC: 6.3 10*3/uL (ref 4.0–10.5)

## 2023-09-30 LAB — COMPREHENSIVE METABOLIC PANEL WITH GFR
ALT: 21 U/L (ref 0–35)
AST: 20 U/L (ref 0–37)
Albumin: 4.4 g/dL (ref 3.5–5.2)
Alkaline Phosphatase: 62 U/L (ref 39–117)
BUN: 10 mg/dL (ref 6–23)
CO2: 29 meq/L (ref 19–32)
Calcium: 9.3 mg/dL (ref 8.4–10.5)
Chloride: 104 meq/L (ref 96–112)
Creatinine, Ser: 0.92 mg/dL (ref 0.40–1.20)
GFR: 67.27 mL/min (ref >60.00)
Glucose, Bld: 108 mg/dL — ABNORMAL HIGH (ref 70–99)
Potassium: 4.1 meq/L (ref 3.5–5.1)
Sodium: 139 meq/L (ref 135–145)
Total Bilirubin: 0.3 mg/dL (ref 0.2–1.2)
Total Protein: 7 g/dL (ref 6.0–8.3)

## 2023-09-30 LAB — LDL CHOLESTEROL, DIRECT: Direct LDL: 76 mg/dL

## 2023-09-30 LAB — HEMOGLOBIN A1C: Hgb A1c MFr Bld: 5.8 % (ref 4.6–6.5)

## 2023-09-30 LAB — TSH: TSH: 0.95 u[IU]/mL (ref 0.35–5.50)

## 2023-09-30 NOTE — Assessment & Plan Note (Addendum)
 Hgb IMPROVED post transfusion  and has nearly normalized with iron  supplementation     Lab Results  Component Value Date   WBC 6.3 09/29/2023   HGB 11.8 (L) 09/29/2023   HCT 34.8 (L) 09/29/2023   MCV 96.6 09/29/2023   PLT 283.0 09/29/2023

## 2023-09-30 NOTE — Assessment & Plan Note (Signed)

## 2023-09-30 NOTE — Assessment & Plan Note (Addendum)
 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 Referring to orthopedics fo evaluation

## 2023-09-30 NOTE — Telephone Encounter (Signed)
 Copied from CRM [id number]. Topic: Clinical - Lab/Test Results >> Sep 30, 2023  3:56 PM Earnestine Goes B wrote: Reason for CRM: pt called to speak with Camilo Cella regarding lab results pt is requesting a call back at [phone]

## 2023-09-30 NOTE — Telephone Encounter (Signed)
 Lung Cancer Screening Narrative/Criteria Questionnaire (Cigarette Smokers Only- No Cigars/Pipes/vapes)   Allison Alvarez   SDMV:10/19/23 at 8am/[facility]                                           05/22/1960               LDCT: 10/20/23 at 8am /[facility]    63 y.o.   Phone: [phone]  Lung Screening Narrative (confirm age 84-77 yrs Medicare / 50-80 yrs Private pay insurance)   Insurance information:BCBS   Referring Provider:[facility]   This screening involves an initial phone call with a team member from our program. It is called a shared decision making visit. The initial meeting is required by insurance and Medicare to make sure you understand the program. This appointment takes about 15-20 minutes to complete. The CT scan will completed at a separate date/time. This scan takes about 5-10 minutes to complete and you may eat and drink before and after the scan.  Criteria questions for Lung Cancer Screening:   Are you a current or former smoker? Former Age began smoking: 25y (currently vapes)   If you are a former smoker, what year did you quit smoking? 2021 quit cigarettes   To calculate your smoking history, I need an accurate estimate of how many packs of cigarettes you smoked per day and for how many years. (Not just the number of PPD you are now smoking)   Years smoking 34 x Packs per day 1.5 = Pack years 51   (at least 20 pack yrs)   (Make sure they understand that we need to know how much they have smoked in the past, not just the number of PPD they are smoking now)  Do you have a personal history of cancer?  No    Do you have a family history of cancer? Yes - Pat Grandpa/lung  Are you coughing up blood?  No  Have you had unexplained weight loss of 15 lbs or more in the last 6 months? No  It looks like you meet all criteria.     Additional information: N/A

## 2023-09-30 NOTE — Assessment & Plan Note (Signed)
 Continue amlodipine  5 mg daily  ,  wmetoprolol 25 mg and telmisartan  40 mg daily given evidence of proteinuria   Lab Results  Component Value Date   MICROALBUR 2.1 (H) 09/29/2023   MICROALBUR 2.1 (H) 01/20/2023

## 2023-10-01 DIAGNOSIS — M2242 Chondromalacia patellae, left knee: Secondary | ICD-10-CM | POA: Diagnosis not present

## 2023-10-01 DIAGNOSIS — M5416 Radiculopathy, lumbar region: Secondary | ICD-10-CM | POA: Diagnosis not present

## 2023-10-01 DIAGNOSIS — M5431 Sciatica, right side: Secondary | ICD-10-CM | POA: Diagnosis not present

## 2023-10-01 DIAGNOSIS — M1712 Unilateral primary osteoarthritis, left knee: Secondary | ICD-10-CM | POA: Diagnosis not present

## 2023-10-04 NOTE — Telephone Encounter (Signed)
 Spoke with pt and she stated that she saw Dr. Lula gazella message and had no further questions.

## 2023-10-06 ENCOUNTER — Other Ambulatory Visit: Payer: Self-pay | Admitting: Internal Medicine

## 2023-10-07 ENCOUNTER — Other Ambulatory Visit: Payer: Self-pay | Admitting: Medical Genetics

## 2023-10-07 NOTE — Telephone Encounter (Signed)
 Spoke to pharmacy. They received first request and will keep second one on file for pt.

## 2023-10-08 ENCOUNTER — Other Ambulatory Visit: Payer: Self-pay

## 2023-10-08 ENCOUNTER — Ambulatory Visit: Payer: Self-pay

## 2023-10-08 ENCOUNTER — Observation Stay

## 2023-10-08 ENCOUNTER — Observation Stay
Admission: EM | Admit: 2023-10-08 | Discharge: 2023-10-09 | Disposition: A | Discharge status: Home / Self Care | Attending: Emergency Medicine | Admitting: Emergency Medicine

## 2023-10-08 ENCOUNTER — Emergency Department

## 2023-10-08 ENCOUNTER — Encounter: Payer: Self-pay | Admitting: Internal Medicine

## 2023-10-08 DIAGNOSIS — G459 Transient cerebral ischemic attack, unspecified: Principal | ICD-10-CM | POA: Diagnosis present

## 2023-10-08 DIAGNOSIS — R4781 Slurred speech: Secondary | ICD-10-CM | POA: Diagnosis not present

## 2023-10-08 DIAGNOSIS — F32A Depression, unspecified: Secondary | ICD-10-CM | POA: Insufficient documentation

## 2023-10-08 DIAGNOSIS — R4701 Aphasia: Secondary | ICD-10-CM | POA: Diagnosis not present

## 2023-10-08 DIAGNOSIS — R42 Dizziness and giddiness: Secondary | ICD-10-CM | POA: Diagnosis not present

## 2023-10-08 DIAGNOSIS — Z79899 Other long term (current) drug therapy: Secondary | ICD-10-CM | POA: Diagnosis not present

## 2023-10-08 DIAGNOSIS — E785 Hyperlipidemia, unspecified: Secondary | ICD-10-CM | POA: Diagnosis not present

## 2023-10-08 DIAGNOSIS — I6782 Cerebral ischemia: Secondary | ICD-10-CM | POA: Diagnosis not present

## 2023-10-08 DIAGNOSIS — I639 Cerebral infarction, unspecified: Secondary | ICD-10-CM | POA: Diagnosis not present

## 2023-10-08 DIAGNOSIS — Z87891 Personal history of nicotine dependence: Secondary | ICD-10-CM | POA: Insufficient documentation

## 2023-10-08 DIAGNOSIS — Z8673 Personal history of transient ischemic attack (TIA), and cerebral infarction without residual deficits: Secondary | ICD-10-CM

## 2023-10-08 DIAGNOSIS — Q670 Congenital facial asymmetry: Principal | ICD-10-CM

## 2023-10-08 DIAGNOSIS — Z7982 Long term (current) use of aspirin: Secondary | ICD-10-CM | POA: Diagnosis not present

## 2023-10-08 DIAGNOSIS — I10 Essential (primary) hypertension: Secondary | ICD-10-CM | POA: Diagnosis not present

## 2023-10-08 DIAGNOSIS — I6789 Other cerebrovascular disease: Secondary | ICD-10-CM

## 2023-10-08 DIAGNOSIS — R29818 Other symptoms and signs involving the nervous system: Secondary | ICD-10-CM | POA: Diagnosis not present

## 2023-10-08 DIAGNOSIS — Z8739 Personal history of other diseases of the musculoskeletal system and connective tissue: Secondary | ICD-10-CM | POA: Insufficient documentation

## 2023-10-08 LAB — APTT: aPTT: 26 s (ref 24–36)

## 2023-10-08 LAB — COMPREHENSIVE METABOLIC PANEL WITH GFR
ALT: 28 U/L (ref 0–44)
AST: 25 U/L (ref 15–41)
Albumin: 4.2 g/dL (ref 3.5–5.0)
Alkaline Phosphatase: 51 U/L (ref 38–126)
Anion gap: 10 (ref 5–15)
BUN: 16 mg/dL (ref 8–23)
CO2: 27 mmol/L (ref 22–32)
Calcium: 9.1 mg/dL (ref 8.9–10.3)
Chloride: 103 mmol/L (ref 98–111)
Creatinine, Ser: 0.84 mg/dL (ref 0.44–1.00)
GFR, Estimated: >60 mL/min (ref >60)
Glucose, Bld: 156 mg/dL — ABNORMAL HIGH (ref 70–99)
Potassium: 3.4 mmol/L — ABNORMAL LOW (ref 3.5–5.1)
Sodium: 140 mmol/L (ref 135–145)
Total Bilirubin: 0.6 mg/dL (ref 0.0–1.2)
Total Protein: 7.2 g/dL (ref 6.5–8.1)

## 2023-10-08 LAB — DIFFERENTIAL
Abs Immature Granulocytes: 0.07 10*3/uL (ref 0.00–0.07)
Basophils Absolute: 0 10*3/uL (ref 0.0–0.1)
Basophils Relative: 0 %
Eosinophils Absolute: 0 10*3/uL (ref 0.0–0.5)
Eosinophils Relative: 0 %
Immature Granulocytes: 1 %
Lymphocytes Relative: 12 %
Lymphs Abs: 1.3 10*3/uL (ref 0.7–4.0)
Monocytes Absolute: 0.2 10*3/uL (ref 0.1–1.0)
Monocytes Relative: 2 %
Neutro Abs: 9 10*3/uL — ABNORMAL HIGH (ref 1.7–7.7)
Neutrophils Relative %: 85 %

## 2023-10-08 LAB — CBC
HCT: 35.1 % — ABNORMAL LOW (ref 36.0–46.0)
Hemoglobin: 11.9 g/dL — ABNORMAL LOW (ref 12.0–15.0)
MCH: 32.9 pg (ref 26.0–34.0)
MCHC: 33.9 g/dL (ref 30.0–36.0)
MCV: 97 fL (ref 80.0–100.0)
Platelets: 333 10*3/uL (ref 150–400)
RBC: 3.62 MIL/uL — ABNORMAL LOW (ref 3.87–5.11)
RDW: 13.1 % (ref 11.5–15.5)
WBC: 10.6 10*3/uL — ABNORMAL HIGH (ref 4.0–10.5)
nRBC: 0 % (ref 0.0–0.2)

## 2023-10-08 LAB — PROTIME-INR
INR: 1 (ref 0.8–1.2)
Prothrombin Time: 14 s (ref 11.4–15.2)

## 2023-10-08 LAB — MAGNESIUM: Magnesium: 2 mg/dL (ref 1.7–2.4)

## 2023-10-08 LAB — HIV ANTIBODY (ROUTINE TESTING W REFLEX): HIV Screen 4th Generation wRfx: NONREACTIVE

## 2023-10-08 LAB — ETHANOL: Alcohol, Ethyl (B): <15 mg/dL (ref <15)

## 2023-10-08 MED ORDER — ASPIRIN 300 MG RE SUPP: 300.0000 mg | Freq: Every day | RECTAL | Status: DC

## 2023-10-08 MED ORDER — ENOXAPARIN SODIUM 40 MG/0.4ML IJ SOSY
40.0000 mg | PREFILLED_SYRINGE | INTRAMUSCULAR | Status: DC
  Administered 2023-10-08: 40 mg via SUBCUTANEOUS
  Filled 2023-10-08: qty 0.4

## 2023-10-08 MED ORDER — LORAZEPAM 0.5 MG PO TABS
0.5000 mg | ORAL_TABLET | Freq: Once | ORAL | Status: AC
  Administered 2023-10-08: 0.5 mg via ORAL
  Filled 2023-10-08: qty 1

## 2023-10-08 MED ORDER — PANTOPRAZOLE SODIUM 40 MG PO TBEC
40.0000 mg | DELAYED_RELEASE_TABLET | Freq: Two times a day (BID) | ORAL | Status: DC
  Administered 2023-10-08 – 2023-10-09 (×2): 40 mg via ORAL
  Filled 2023-10-08 (×2): qty 1

## 2023-10-08 MED ORDER — ACETAMINOPHEN 650 MG RE SUPP: 650.0000 mg | RECTAL | Status: DC | PRN

## 2023-10-08 MED ORDER — GABAPENTIN 100 MG PO CAPS
100.0000 mg | ORAL_CAPSULE | Freq: Two times a day (BID) | ORAL | Status: DC
  Administered 2023-10-08 – 2023-10-09 (×2): 100 mg via ORAL
  Filled 2023-10-08 (×2): qty 1

## 2023-10-08 MED ORDER — POTASSIUM CHLORIDE CRYS ER 20 MEQ PO TBCR
40.0000 meq | EXTENDED_RELEASE_TABLET | Freq: Once | ORAL | Status: AC
  Administered 2023-10-08: 40 meq via ORAL
  Filled 2023-10-08: qty 2

## 2023-10-08 MED ORDER — STROKE: EARLY STAGES OF RECOVERY BOOK: Freq: Once | Status: AC

## 2023-10-08 MED ORDER — ROPINIROLE HCL 0.25 MG PO TABS
0.5000 mg | ORAL_TABLET | Freq: Every day | ORAL | Status: DC
  Administered 2023-10-08: 0.5 mg via ORAL
  Filled 2023-10-08: qty 2

## 2023-10-08 MED ORDER — ACETAMINOPHEN 325 MG PO TABS: 650.0000 mg | ORAL_TABLET | ORAL | Status: DC | PRN

## 2023-10-08 MED ORDER — ALPRAZOLAM 0.25 MG PO TABS
0.2500 mg | ORAL_TABLET | Freq: Two times a day (BID) | ORAL | Status: DC | PRN
  Administered 2023-10-08: 0.25 mg via ORAL
  Filled 2023-10-08: qty 1

## 2023-10-08 MED ORDER — GABAPENTIN 300 MG PO CAPS
300.0000 mg | ORAL_CAPSULE | Freq: Every evening | ORAL | Status: DC
  Administered 2023-10-08: 300 mg via ORAL
  Filled 2023-10-08: qty 1

## 2023-10-08 MED ORDER — ASPIRIN 325 MG PO TABS
325.0000 mg | ORAL_TABLET | Freq: Every day | ORAL | Status: DC
  Filled 2023-10-08: qty 1

## 2023-10-08 MED ORDER — ROSUVASTATIN CALCIUM 10 MG PO TABS
10.0000 mg | ORAL_TABLET | Freq: Every day | ORAL | Status: DC
  Administered 2023-10-09: 10 mg via ORAL
  Filled 2023-10-08: qty 1

## 2023-10-08 MED ORDER — SODIUM CHLORIDE 0.9% FLUSH: 3.0000 mL | INTRAVENOUS | Status: DC | PRN

## 2023-10-08 MED ORDER — IOHEXOL 350 MG/ML SOLN
75.0000 mL | Freq: Once | INTRAVENOUS | Status: AC | PRN
  Administered 2023-10-09: 75 mL via INTRAVENOUS

## 2023-10-08 MED ORDER — BUSPIRONE HCL 5 MG PO TABS
15.0000 mg | ORAL_TABLET | Freq: Two times a day (BID) | ORAL | Status: DC
  Administered 2023-10-08 – 2023-10-09 (×2): 15 mg via ORAL
  Filled 2023-10-08 (×2): qty 3

## 2023-10-08 MED ORDER — SODIUM CHLORIDE 0.9% FLUSH: 3.0000 mL | Freq: Once | INTRAVENOUS | Status: DC

## 2023-10-08 MED ORDER — SODIUM CHLORIDE 0.9% FLUSH
3.0000 mL | Freq: Two times a day (BID) | INTRAVENOUS | Status: DC
  Administered 2023-10-08 – 2023-10-09 (×2): 10 mL via INTRAVENOUS

## 2023-10-08 MED ORDER — AMPHETAMINE-DEXTROAMPHET ER 25 MG PO CP24: 25.0000 mg | ORAL_CAPSULE | Freq: Every day | ORAL | Status: DC

## 2023-10-08 MED ORDER — MILNACIPRAN HCL 50 MG PO TABS
100.0000 mg | ORAL_TABLET | Freq: Two times a day (BID) | ORAL | Status: DC
  Administered 2023-10-08 – 2023-10-09 (×2): 100 mg via ORAL
  Filled 2023-10-08 (×2): qty 2

## 2023-10-08 MED ORDER — TRAZODONE HCL 50 MG PO TABS
75.0000 mg | ORAL_TABLET | Freq: Every day | ORAL | Status: DC
  Administered 2023-10-08: 75 mg via ORAL
  Filled 2023-10-08: qty 2

## 2023-10-08 MED ORDER — ACETAMINOPHEN 160 MG/5ML PO SOLN: 650.0000 mg | ORAL | Status: DC | PRN

## 2023-10-08 MED ORDER — ASPIRIN 81 MG PO CHEW
324.0000 mg | CHEWABLE_TABLET | Freq: Once | ORAL | Status: AC
  Administered 2023-10-08: 324 mg via ORAL
  Filled 2023-10-08: qty 4

## 2023-10-08 NOTE — H&P (Signed)
 History and Physical    Patient: Allison Alvarez FMW:[id number] DOB: 1960-09-24 DOA: 10/08/2023 DOS: the patient was seen and examined on 10/08/2023 PCP: Marylynn Verneita CROME, MD  Patient coming from: Home  Chief Complaint:left facial droop  HPI: Allison Alvarez is a 63 y.o. female with medical history significant of anxiety, depression, fibromyalgia, gastric ulcer, hypertension, hyperlipidemia comes in from work with facial asymmetry. Patient said she recently has been started on high dose prednisone  because of her joint pain. She woke up feeling very groggy this morning felt as the day went long difficulty finding words still went to work and coworker noticed facial asymmetry on the left side. Patient is felt her eyes were glassy. She came to the emergency room after she noted her blood pressure was elevated at work.  In the ER she is hemodynamically stable blood pressure 165/72. CT head showed small remote infarct left coronary data. No acute intracranial abnormality. She is not having any difficulty with speech and her facial asymmetry resolved. Patient received aspirin in the ER. She is being admitted for further workup of TIA rule out CVA.     Review of Systems: As mentioned in the history of present illness. All other systems reviewed and are negative. Past Medical History:  Diagnosis Date   Allergy    Anemia 04/11/2022   Low potassium   Anxiety    Arthritis    Blood transfusion without reported diagnosis 2009   after total hysterectomy   Depression    Diverticulitis    Duodenal ulcer perforation (HCC) 04/23/2022   Fibromyalgia    Gastric perforation, acute 04/08/2022   GERD (gastroesophageal reflux disease) Age 28   GERD   Hyperlipidemia    Hypertension    Pelvic abscess in female 04/21/2022   PONV (postoperative nausea and vomiting)    PVC (premature ventricular contraction)    Ulcer 04/08/2022   Acute perforation   Past Surgical History:  Procedure Laterality Date    ABDOMINAL HYSTERECTOMY  2007   APPENDECTOMY  2008   BLADDER REPAIR  2008   CHOLECYSTECTOMY     COLON RESECTION  11/2020   COLON SURGERY  2022   Partial resection   COLONOSCOPY     07/2016, 07/2020   ESOPHAGOGASTRODUODENOSCOPY  07/2020   SMALL INTESTINE SURGERY  04/08/2022   Perforation of duodenum   TUBAL LIGATION  1996   VESICOVAGINAL FISTULA CLOSURE  2008   XI ROBOT ASSISTED DIAGNOSTIC LAPAROSCOPY N/A 04/08/2022   Procedure: XI ROBOT ASSISTED DUODENAL ULCER PERFORATION REPAIR;  Surgeon: Rodolph Romano, MD;  Location: [facility] ORS;  Service: General;  Laterality: N/A;   Social History:  reports that she quit smoking about 3 years ago. Her smoking use included e-cigarettes and cigarettes. She has never used smokeless tobacco. She reports current alcohol use of about 2.0 standard drinks of alcohol per week. She reports that she does not use drugs.  Allergies  Allergen Reactions   Morphine  Itching   Hydromorphone  Itching    Family History  Problem Relation Age of Onset   Hypertension Mother    Hyperlipidemia Mother    Depression Mother    Arthritis Mother    Mental illness Mother    Anxiety disorder Mother    Hearing loss Mother    Stroke Father    Early death Father    Depression Father    Arthritis Father    Hearing loss Father    Mental illness Brother    Drug abuse Brother    Depression Brother  Cancer Brother        skin cancer   Arthritis Brother    Anxiety disorder Daughter    ADD / ADHD Daughter    Anxiety disorder Son    Breast cancer Paternal Aunt 15   Hypertension Maternal Grandmother    Cancer Maternal Grandmother    Arthritis Maternal Grandmother    Diabetes Maternal Grandfather    Depression Maternal Grandfather    Cancer Paternal Grandfather    Arthritis Brother    Depression Brother    Drug abuse Brother    Kidney disease Brother    Cancer Brother    Cancer Paternal Aunt    Stroke Paternal Uncle    ADD / ADHD Son    Anxiety disorder Son      Prior to Admission medications   Medication Sig Start Date End Date Taking? Authorizing Provider  acetaminophen  (TYLENOL ) 325 MG tablet Take 650 mg by mouth every 6 (six) hours as needed for moderate pain.    [provider]  ALPRAZolam  (XANAX ) 0.25 MG tablet TAKE 1 TABLET BY MOUTH 2 TIMES A DAY AS NEEDED FOR ANXIETY 08/19/23   Marylynn Verneita CROME, MD  amLODipine  (NORVASC ) 5 MG tablet TAKE 1 TABLET BY MOUTH DAILY 09/01/23   Marylynn Verneita CROME, MD  amphetamine -dextroamphetamine  (ADDERALL XR) 25 MG 24 hr capsule Take 1 capsule by mouth daily. Please schedule an appointment with Dr Joane. 07/14/23   Joane Artist RAMAN, MD  busPIRone  (BUSPAR ) 15 MG tablet Take 1 tablet (15 mg total) by mouth 2 (two) times daily. 09/29/23   Marylynn Verneita CROME, MD  cyanocobalamin  (VITAMIN B12) 1000 MCG/ML injection Inject 1 mL (1,000 mcg total) into the muscle every 30 (thirty) days. 12/23/22   Marylynn Verneita CROME, MD  diclofenac  Sodium (VOLTAREN  ARTHRITIS PAIN) 1 % GEL Apply 4 g topically 4 (four) times daily. 06/07/23   Narendra, Nischal, MD  doxycycline  (VIBRAMYCIN ) 50 MG capsule TAKE 1 CAPSULE BY MOUTH DAILY 09/28/23   Marylynn Verneita CROME, MD  gabapentin  (NEURONTIN ) 100 MG capsule Take 1 capsule (100 mg total) by mouth 2 (two) times daily. 08/25/23   Marylynn Verneita CROME, MD  gabapentin  (NEURONTIN ) 300 MG capsule Take 1 capsule (300 mg total) by mouth every evening. 09/29/23   Marylynn Verneita CROME, MD  metoprolol  tartrate (LOPRESSOR ) 25 MG tablet TAKE 1 TABLET BY MOUTH 2 TIMES A DAY 10/06/23   Marylynn Verneita CROME, MD  ondansetron  (ZOFRAN -ODT) 4 MG disintegrating tablet Take 1 tablet (4 mg total) by mouth every 8 (eight) hours as needed for nausea or vomiting. 07/23/22   Angelena Smalls, MD  pantoprazole  (PROTONIX ) 40 MG tablet TAKE 1 TABLET BY MOUTH TWICE A DAY 10/19/22   Marylynn Verneita CROME, MD  rOPINIRole  (REQUIP ) 0.25 MG tablet TAKE 1 TABLET BY MOUTH EVERY EVENING AFTER DINNER AND AT BEDTIME. MAY INCREASE WEEKLY AS NEEDED 09/29/23   Marylynn Verneita CROME, MD   rosuvastatin  (CRESTOR ) 10 MG tablet TAKE 1 TABLET BY MOUTH DAILY 12/10/22   Marylynn Verneita CROME, MD  SAVELLA  100 MG TABS tablet TAKE 1 TABLET BY MOUTH 2 TIMES A DAY 09/09/23   Marylynn Verneita CROME, MD  telmisartan  (MICARDIS ) 40 MG tablet Take 1 tablet (40 mg total) by mouth daily. 09/29/23   Marylynn Verneita CROME, MD  traZODone  (DESYREL ) 150 MG tablet Take 0.5 tablets (75 mg total) by mouth at bedtime. 02/01/23   Marylynn Verneita CROME, MD    Physical Exam: Vitals:   10/08/23 1143 10/08/23 1145 10/08/23 1325 10/08/23 1526  BP:  (!) 176/95 (!) 154/104 (!) 170/97  Pulse:  79 75 71  Resp:  16 18 18   Temp:  98.9 F (37.2 C)  98.5 F (36.9 C)  TempSrc:  Oral    SpO2:  97% 99% 100%  Weight: 79 kg      Physical Exam Constitutional:      Appearance: Normal appearance.  HENT:     Head: Normocephalic and atraumatic.   Eyes:     Extraocular Movements: Extraocular movements intact.     Pupils: Pupils are equal, round, and reactive to light.    Cardiovascular:     Rate and Rhythm: Normal rate and regular rhythm.  Pulmonary:     Effort: Pulmonary effort is normal.     Breath sounds: Normal breath sounds.   Musculoskeletal:        General: Normal range of motion.     Cervical back: Normal range of motion.   Neurological:     General: No focal deficit present.     Mental Status: She is alert and oriented to person, place, and time. Mental status is at baseline.   Psychiatric:        Mood and Affect: Mood normal.        Behavior: Behavior normal.      Assessment and Plan: Allison Alvarez is a 63 y.o. female with medical history significant of anxiety, depression, fibromyalgia, gastric ulcer, hypertension, hyperlipidemia comes in from work with facial asymmetry.  TIA -- came in with grogginess feeling and left facial asymmetry with dysarthria now resolved -- CT head negative for acute intracranial abnormality. 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 Observation status is judged to be reasonable and necessary in order to provide the required intensity of service to ensure the patient's safety. The patient's presenting symptoms, physical exam findings, and initial radiographic and laboratory data in the context of their medical condition is felt to place them at decreased risk for further clinical deterioration. Furthermore, it is anticipated that the patient will be medically stable for discharge from the hospital within 2 midnights of admission.   Author: Leita Blanch, MD 10/08/2023 3:38 PM  For on call review [email or web address].

## 2023-10-08 NOTE — ED Notes (Signed)
 CCMD called at this time
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
## 2023-10-08 NOTE — ED Triage Notes (Addendum)
 Woke up late this morinng and felt 'drugged'. Left sided of mouth wasn't moving right. Feeling like she is having difficulty getting words out of mouth.  Lightheadedness. Went to bed last night at 2300 and work up at [employer] feeling this way.   MAE equally and strong. Speech clear. Equal facial movments. Gait steady. NAD  Taking prednisone  taper currently for hip pain.

## 2023-10-08 NOTE — Telephone Encounter (Signed)
 FYI Only or Action Required?: FYI only for provider.  Patient was last seen in primary care on 09/29/2023 by Marylynn Verneita CROME, MD. Called Nurse Triage reporting Neurologic Problem. Symptoms began today. Interventions attempted: Nothing. Symptoms are: gradually worsening.  Triage Disposition: Call EMS 911 Now  Patient/caregiver understands and will follow disposition?:yes  Didn't want EMS, Having a coworker drive her now.       Copied from CRM [id number]. Topic: Clinical - Red Word Triage >> Oct 08, 2023 11:11 AM Allison Alvarez wrote: Red Word that prompted transfer to Nurse Triage: Patient is calling because she is lightheaded , slushy, and she said her eyes are glassy looking. The way the patient is describing it's a stroke. Reason for Disposition  [1] Weakness (i.e., paralysis, loss of muscle strength) of the face, arm / hand, or leg / foot on one side of the body AND [2] sudden onset AND [3] present now  (Exception: Bell's palsy suspected [i.e., weakness only on one side of the face, developing over hours to days, no other symptoms].)  Answer Assessment - Initial Assessment Questions 1. SYMPTOM: What is the main symptom you are concerned about? (e.g., weakness, numbness)     Drooping of left eye,  2. ONSET: When did this start? (minutes, hours, days; while sleeping)     This morning 3. LAST NORMAL: When was the last time you (the patient) were normal (no symptoms)?     Last night 4. PATTERN Does this come and go, or has it been constant since it started?  Is it present now?     Present now  Protocols used: Neurologic Deficit-A-AH

## 2023-10-08 NOTE — Progress Notes (Signed)
 Patient has excellent vasculature - please assess and/or attempt prior to placing IVT consult.   Allison Alvarez Allison Alvarez

## 2023-10-08 NOTE — Telephone Encounter (Signed)
FYI Pt is currently at the ED 

## 2023-10-08 NOTE — ED Provider Notes (Signed)
 [facility] Provider Note    Event Date/Time   First MD Initiated Contact with Patient 10/08/23 1349     (approximate)   History   No chief complaint on file.   HPI  Allison Alvarez is a 63 y.o. female past medical history significant for anemia, anxiety, fibromyalgia, hypertension, hyperlipidemia, who presents to the emergency department following an episode of facial asymmetry.  Per report patient woke up at approximately 720 feeling lightheaded and woozy and felt like the left side of her mouth was not working correctly.  Went to bed last night at 11 PM.  States that whenever she woke up she felt like she had been drugged.  Was having difficulty getting her words out and felt like her left side of her face and mouth was not working correctly.  States that she went to work [employer] and when she got to work after being there for about an hour someone at work a coworker asked her why her face was asymmetric.  They stated that the left side of her face appeared abnormal and was drooping.  She feels like she is more back to her normal baseline at this time.  Denies any change in vision, slurring of speech, trouble swallowing or extremity numbness or weakness.  No dizziness or trouble with her gait.  Does state that she has a history of vertigo.  No prior history of CVA.  Does endorse being significantly anxious.  No recent falls or head trauma.  Not on anticoagulation.    Currently on a prednisone  taper for hip pain.     Physical Exam   Triage Vital Signs: ED Triage Vitals  Encounter Vitals Group     BP 10/08/23 1145 (!) 176/95     Girls Systolic BP Percentile --      Girls Diastolic BP Percentile --      Boys Systolic BP Percentile --      Boys Diastolic BP Percentile --      Pulse Rate 10/08/23 1145 79     Resp 10/08/23 1145 16     Temp 10/08/23 1145 98.9 F (37.2 C)     Temp Source 10/08/23 1145 Oral     SpO2 10/08/23 1145 97 %     Weight 10/08/23 1143 174  lb 2.6 oz (79 kg)     Height --      Head Circumference --      Peak Flow --      Pain Score 10/08/23 1143 0     Pain Loc --      Pain Education --      Exclude from Growth Chart --     Most recent vital signs: Vitals:   10/08/23 1145 10/08/23 1325  BP: (!) 176/95 (!) 154/104  Pulse: 79 75  Resp: 16 18  Temp: 98.9 F (37.2 C)   SpO2: 97% 99%    Physical Exam Constitutional:      Appearance: She is well-developed.  HENT:     Head: Atraumatic.   Eyes:     Conjunctiva/sclera: Conjunctivae normal.    Cardiovascular:     Rate and Rhythm: Regular rhythm.  Pulmonary:     Effort: No respiratory distress.  Abdominal:     General: There is no distension.   Musculoskeletal:        General: Normal range of motion.     Cervical back: Normal range of motion.   Skin:    General: Skin is warm.  Neurological:     Mental Status: She is alert. Mental status is at baseline.     GCS: GCS eye subscore is 4. GCS verbal subscore is 5. GCS motor subscore is 6.     Cranial Nerves: Cranial nerves 2-12 are intact.     Sensory: Sensation is intact.     Motor: Motor function is intact.     Coordination: Coordination is intact.     Gait: Gait is intact.     IMPRESSION / MDM / ASSESSMENT AND PLAN / ED COURSE  I reviewed the triage vital signs and the nursing notes.  Differential diagnosis including TIA/CVA, intracranial hemorrhage, electrolyte abnormality, dehydration, infectious process  EKG  I, Clotilda Punter, the attending physician, personally viewed and interpreted this ECG.  EKG with incomplete right bundle branch block.  No significant change when compared to prior EKG.  No significant ST elevation or depression.  No findings of acute ischemia or dysrhythmia.  No tachycardic or bradycardic dysrhythmias while on cardiac telemetry.  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  LABS (all labs ordered are listed, but only abnormal results are displayed) Labs interpreted as -    Labs Reviewed  CBC - Abnormal; Notable for the following components:      Result Value   WBC 10.6 (*)    RBC 3.62 (*)    Hemoglobin 11.9 (*)    HCT 35.1 (*)    All other components within normal limits  DIFFERENTIAL - Abnormal; Notable for the following components:   Neutro Abs 9.0 (*)    All other components within normal limits  COMPREHENSIVE METABOLIC PANEL WITH GFR - Abnormal; Notable for the following components:   Potassium 3.4 (*)    Glucose, Bld 156 (*)    All other components within normal limits  PROTIME-INR  APTT  ETHANOL  MAGNESIUM  I-STAT CREATININE, ED  CBG MONITORING, ED     MDM   Clinical Course as of 10/08/23 1446  Fri Oct 08, 2023  1333 CT HEAD WO CONTRAST IMPRESSION: No CT evidence of acute intracranial abnormality.  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   [TT]  1333 Independent review of labs, electrolytes really deranged, LFTs are normal, mild leukocytosis, PT/INR PTT are normal. [TT]    Clinical Course User Index [TT] Waymond Lorelle Cummins, MD   Potassium mildly low.  Given oral replacement.  Given aspirin.  Patient endorses being significantly anxious.  Will give half of an Ativan.  Clinical picture is concerning for possible TIA/CVA.  Multiple risk factors and prior CVA noted on her CT scan of her head.  Consulted hospitalist for admission and TIA workup.  PROCEDURES:  Critical Care performed: No  Procedures  Patient's presentation is most consistent with acute presentation with potential threat to life or bodily function.   MEDICATIONS ORDERED IN ED: Medications  sodium chloride  flush (NS) 0.9 % injection 3 mL ( Intravenous Canceled Entry 10/08/23 1224)  aspirin chewable tablet 324 mg (has no administration in time range)  LORazepam (ATIVAN) tablet 0.5  mg (has no administration in time range)  potassium chloride  SA (KLOR-CON  M) CR tablet 40 mEq (40 mEq Oral Given 10/08/23 1440)    FINAL CLINICAL IMPRESSION(S) / ED DIAGNOSES   Final diagnoses:  Facial asymmetry  Slurred speech     Rx /  DC Orders   ED Discharge Orders     None        Note:  This document was prepared using Dragon voice recognition software and may include unintentional dictation errors.   Suzanne Kirsch, MD 10/08/23 1446

## 2023-10-08 NOTE — Progress Notes (Signed)
 SLP Cancellation Note  Patient Details Name: Allison Alvarez MRN: [id number] DOB: [birth year]   Cancelled treatment:       Reason Eval/Treat Not Completed:  (chart reviewed; consulted MD re: pt's status and to obtain her diet order since passing the NSG swallow screen at admit.)  Per discussion, MD ordered po diet and stated pt has no needs for ST services at this time; per chart, pt is not having any difficulty with speech and her facial asymmetry resolved..  ST services can be available if any further needs while admitted. MD agreed.    Comer Portugal, MS, CCC-SLP Speech Language Pathologist Rehab Services; [facility] [phone] (ascom) Jameyah Fennewald 10/08/2023, 4:08 PM

## 2023-10-09 ENCOUNTER — Observation Stay

## 2023-10-09 ENCOUNTER — Observation Stay (HOSPITAL_BASED_OUTPATIENT_CLINIC_OR_DEPARTMENT_OTHER)
Admit: 2023-10-09 | Discharge: 2023-10-09 | Disposition: A | Discharge status: Home / Self Care | Attending: Neurology | Admitting: Neurology

## 2023-10-09 DIAGNOSIS — R29818 Other symptoms and signs involving the nervous system: Secondary | ICD-10-CM | POA: Diagnosis not present

## 2023-10-09 DIAGNOSIS — G459 Transient cerebral ischemic attack, unspecified: Secondary | ICD-10-CM

## 2023-10-09 DIAGNOSIS — Q670 Congenital facial asymmetry: Secondary | ICD-10-CM | POA: Diagnosis not present

## 2023-10-09 LAB — ECHOCARDIOGRAM COMPLETE BUBBLE STUDY
AR max vel: 2.55 cm2
AV Area VTI: 2.88 cm2
AV Area mean vel: 2.61 cm2
AV Mean grad: 8 mmHg
AV Peak grad: 15.9 mmHg
Ao pk vel: 2 m/s
Area-P 1/2: 4.83 cm2
P 1/2 time: 643 ms
S' Lateral: 3.3 cm

## 2023-10-09 LAB — LIPID PANEL
Cholesterol: 128 mg/dL (ref 0–200)
HDL: 66 mg/dL (ref >40)
LDL Cholesterol: 42 mg/dL (ref 0–99)
Total CHOL/HDL Ratio: 1.9 ratio
Triglycerides: 98 mg/dL (ref <150)
VLDL: 20 mg/dL (ref 0–40)

## 2023-10-09 MED ORDER — ASPIRIN 81 MG PO TBEC
81.0000 mg | DELAYED_RELEASE_TABLET | Freq: Every day | ORAL | Status: DC
  Administered 2023-10-09: 81 mg via ORAL
  Filled 2023-10-09: qty 1

## 2023-10-09 MED ORDER — ASPIRIN 81 MG PO TBEC
81.0000 mg | DELAYED_RELEASE_TABLET | Freq: Every day | ORAL | 12 refills | Status: AC
Start: 2023-10-10 — End: ?

## 2023-10-09 NOTE — Progress Notes (Signed)
 PT Cancellation Note  Patient Details Name: Allison Alvarez MRN: [id number] DOB: 1960/09/01   Cancelled Treatment:    Reason Eval/Treat Not Completed: PT screened, no needs identified, will sign off. PT orders received and pt chart reviewed. Pt received supine with spouse at bedside. Pt notes that her mobility is at baseline in regards to strength, balance, and overall mobility. She notes that dysarthria and L facial droop has resolved; just still feels a little foggy but believes it's due to fatigue. Pt has referral to [facility] for sciatica and L knee issues. Denies needing acute PT services or DME at DC. PT to sign off, please re-consult as appropriate.    Camie CHARLENA Kluver, PT, DPT 9:37 AM,10/09/23 Physical Therapist - [facility]

## 2023-10-09 NOTE — Discharge Instructions (Signed)
 Complete your Prednisone  rx as ordered by Ortho

## 2023-10-09 NOTE — Progress Notes (Signed)
  Echocardiogram 2D Echocardiogram has been performed. A Saline Microcavitation (Bubble Study) was requested and completed.  Allison Alvarez Louder 10/09/2023, 11:56 AM

## 2023-10-09 NOTE — Progress Notes (Signed)
Pt refused wheelchair at discharge. 

## 2023-10-09 NOTE — Progress Notes (Signed)
 OT Cancellation Note  Patient Details Name: Christan Ciccarelli MRN: [id number] DOB: 25-Jan-1961   Cancelled Treatment:    Reason Eval/Treat Not Completed: OT screened, no needs identified, will sign off. Pt reports symptoms have resolved and she is at baseline level for mobility and self care needs. OT to complete orders at this time.   Izetta Claude, MS, OTR/L , CBIS ascom [phone]  10/09/23, 9:50 AM

## 2023-10-09 NOTE — Plan of Care (Signed)
  Problem: Education: Goal: Knowledge of disease or condition will improve Outcome: Progressing Goal: Knowledge of secondary prevention will improve (MUST DOCUMENT ALL) Outcome: Progressing Goal: Knowledge of patient specific risk factors will improve (DELETE if not current risk factor) Outcome: Progressing   Problem: Ischemic Stroke/TIA Tissue Perfusion: Goal: Complications of ischemic stroke/TIA will be minimized Outcome: Progressing   Problem: Coping: Goal: Will verbalize positive feelings about self Outcome: Progressing Goal: Will identify appropriate support needs Outcome: Progressing   Problem: Health Behavior/Discharge Planning: Goal: Ability to manage health-related needs will improve Outcome: Progressing Goal: Goals will be collaboratively established with patient/family Outcome: Progressing   Problem: Self-Care: Goal: Ability to participate in self-care as condition permits will improve Outcome: Progressing Goal: Verbalization of feelings and concerns over difficulty with self-care will improve Outcome: Progressing Goal: Ability to communicate needs accurately will improve Outcome: Progressing   Problem: Nutrition: Goal: Risk of aspiration will decrease Outcome: Progressing Goal: Dietary intake will improve Outcome: Progressing   Problem: Education: Goal: Knowledge of General Education information will improve Description: Including pain rating scale, medication(s)/side effects and non-pharmacologic comfort measures Outcome: Progressing   Problem: Health Behavior/Discharge Planning: Goal: Ability to manage health-related needs will improve Outcome: Progressing   Problem: Clinical Measurements: Goal: Ability to maintain clinical measurements within normal limits will improve Outcome: Progressing Goal: Will remain free from infection Outcome: Progressing Goal: Diagnostic test results will improve Outcome: Progressing Goal: Respiratory complications will  improve Outcome: Progressing Goal: Cardiovascular complication will be avoided Outcome: Progressing   Problem: Activity: Goal: Risk for activity intolerance will decrease Outcome: Progressing   Problem: Nutrition: Goal: Adequate nutrition will be maintained Outcome: Progressing   Problem: Coping: Goal: Level of anxiety will decrease Outcome: Progressing   Problem: Elimination: Goal: Will not experience complications related to bowel motility Outcome: Progressing Goal: Will not experience complications related to urinary retention Outcome: Progressing   Problem: Pain Managment: Goal: General experience of comfort will improve and/or be controlled Outcome: Progressing   Problem: Safety: Goal: Ability to remain free from injury will improve Outcome: Progressing   Problem: Skin Integrity: Goal: Risk for impaired skin integrity will decrease Outcome: Progressing   Problem: Education: Goal: Knowledge of General Education information will improve Description: Including pain rating scale, medication(s)/side effects and non-pharmacologic comfort measures Outcome: Progressing
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
## 2023-10-11 ENCOUNTER — Telehealth: Payer: Self-pay | Admitting: Internal Medicine

## 2023-10-11 ENCOUNTER — Telehealth: Payer: Self-pay

## 2023-10-11 NOTE — Transitions of Care (Post Inpatient/ED Visit) (Signed)
 10/11/2023  Name: Allison Alvarez MRN: [id number] DOB: 1960/10/05  Today's TOC FU Call Status: Today's TOC FU Call Status:: Successful TOC FU Call Completed TOC FU Call Complete Date: 10/11/23 Patient's Name and Date of Birth confirmed.  Transition Care Management Follow-up Telephone Call Date of Discharge: 10/09/23 Discharge Facility: [facility]) Type of Discharge: Inpatient Admission Primary Inpatient Discharge Diagnosis:: TIA How have you been since you were released from the hospital?: Better Any questions or concerns?: No  Items Reviewed: Did you receive and understand the discharge instructions provided?: Yes Medications obtained,verified, and reconciled?: Yes (Medications Reviewed) Any new allergies since your discharge?: No Dietary orders reviewed?: Yes Do you have support at home?: Yes People in Home [RPT]: spouse  Medications Reviewed Today: Medications Reviewed Today     Reviewed by Emmitt Pan, LPN (Licensed Practical Nurse) on 10/11/23 at 1446  Med List Status: <None>   Medication Order Taking? Sig Documenting Provider Last Dose Status Informant  acetaminophen  (TYLENOL ) 325 MG tablet [id number] Yes Take 650 mg by mouth every 6 (six) hours as needed for moderate pain. [provider]  Active Self  ALPRAZolam  (XANAX ) 0.25 MG tablet [id number] Yes TAKE 1 TABLET BY MOUTH 2 TIMES A DAY AS NEEDED FOR ANXIETY Marylynn Verneita CROME, MD  Active   amLODipine  (NORVASC ) 5 MG tablet [id number] Yes TAKE 1 TABLET BY MOUTH DAILY Tullo, Teresa L, MD  Active   amphetamine -dextroamphetamine  (ADDERALL XR) 25 MG 24 hr capsule [id number] Yes Take 1 capsule by mouth daily. Please schedule an appointment with Dr Joane. Corey, Evan S, MD  Active   aspirin EC 81 MG tablet [id number] Yes Take 1 tablet (81 mg total) by mouth daily. Swallow whole. Tobie Calix, MD  Active   busPIRone  (BUSPAR ) 15 MG tablet [id number] Yes Take 1 tablet (15 mg total) by mouth 2 (two)  times daily. Marylynn Verneita CROME, MD  Active   cyanocobalamin  (VITAMIN B12) 1000 MCG/ML injection [id number] Yes Inject 1 mL (1,000 mcg total) into the muscle every 30 (thirty) days. Marylynn Verneita CROME, MD  Active   diclofenac  Sodium (VOLTAREN  ARTHRITIS PAIN) 1 % GEL [id number] Yes Apply 4 g topically 4 (four) times daily. Narendra, Nischal, MD  Active   doxycycline  (VIBRAMYCIN ) 50 MG capsule [id number] Yes TAKE 1 CAPSULE BY MOUTH DAILY Tullo, Teresa L, MD  Active   gabapentin  (NEURONTIN ) 100 MG capsule [id number] Yes Take 1 capsule (100 mg total) by mouth 2 (two) times daily. Marylynn Verneita CROME, MD  Active   gabapentin  (NEURONTIN ) 300 MG capsule [id number] Yes Take 1 capsule (300 mg total) by mouth every evening. Tullo, Teresa L, MD  Active   metoprolol  tartrate (LOPRESSOR ) 25 MG tablet [id number] Yes TAKE 1 TABLET BY MOUTH 2 TIMES A DAY Marylynn Verneita CROME, MD  Active   ondansetron  (ZOFRAN -ODT) 4 MG disintegrating tablet [id number] Yes Take 1 tablet (4 mg total) by mouth every 8 (eight) hours as needed for nausea or vomiting. Angelena Smalls, MD  Active   pantoprazole  (PROTONIX ) 40 MG tablet [id number] Yes TAKE 1 TABLET BY MOUTH TWICE A DAY Marylynn Verneita CROME, MD  Active   rOPINIRole  (REQUIP ) 0.25 MG tablet [id number] Yes TAKE 1 TABLET BY MOUTH EVERY EVENING AFTER DINNER AND AT BEDTIME. MAY INCREASE WEEKLY AS NEEDED Marylynn Verneita CROME, MD  Active   rosuvastatin  (CRESTOR ) 10 MG tablet [id number] Yes TAKE 1 TABLET BY MOUTH DAILY Tullo, Teresa L, MD  Active   SAVELLA  100 MG TABS tablet [id number] Yes TAKE 1 TABLET  BY MOUTH 2 TIMES A DAY Marylynn Verneita CROME, MD  Active   telmisartan  (MICARDIS ) 40 MG tablet [id number] Yes Take 1 tablet (40 mg total) by mouth daily. Marylynn Verneita CROME, MD  Active   traZODone  (DESYREL ) 150 MG tablet [id number] Yes Take 0.5 tablets (75 mg total) by mouth at bedtime. Marylynn Verneita CROME, MD  Active             Home Care and Equipment/Supplies: Were Home Health Services Ordered?: NA Any new equipment or medical  supplies ordered?: NA  Functional Questionnaire: Do you need assistance with bathing/showering or dressing?: No Do you need assistance with meal preparation?: No Do you need assistance with eating?: No Do you have difficulty maintaining continence: No Do you need assistance with getting out of bed/getting out of a chair/moving?: No Do you have difficulty managing or taking your medications?: No  Follow up appointments reviewed: PCP Follow-up appointment confirmed?: Yes Date of PCP follow-up appointment?: 10/12/23 Follow-up Provider: [facility] Follow-up appointment confirmed?: NA Do you need transportation to your follow-up appointment?: No Do you understand care options if your condition(s) worsen?: Yes-patient verbalized understanding   Julian Lemmings, LPN [facility] Nurse Health Advisor Direct Dial [phone]  SIGNATURE Julian Lemmings, LPN [facility] Nurse Health Advisor Direct Dial [phone]

## 2023-10-11 NOTE — Telephone Encounter (Signed)
 Noted

## 2023-10-11 NOTE — Telephone Encounter (Signed)
 Copied from CRM [id number]. Topic: Appointments - Scheduling Inquiry for Clinic >> Oct 11, 2023  9:15 AM Allison Alvarez wrote: Reason for CRM: Patient is trying to schedule a Hospital f/u and only wants to see Dr. Marylynn, but there are no availabilities until August. Patient would like to know if there is anyway she can be worked into the schedule.

## 2023-10-11 NOTE — Telephone Encounter (Signed)
 Pt is scheduled for tomorrow at 9

## 2023-10-12 ENCOUNTER — Ambulatory Visit: Admitting: Internal Medicine

## 2023-10-12 ENCOUNTER — Encounter: Payer: Self-pay | Admitting: Internal Medicine

## 2023-10-12 ENCOUNTER — Other Ambulatory Visit
Admission: RE | Admit: 2023-10-12 | Discharge: 2023-10-12 | Disposition: A | Discharge status: Home / Self Care | Payer: Self-pay | Source: Ambulatory Visit | Attending: Medical Genetics | Admitting: Medical Genetics

## 2023-10-12 VITALS — BP 150/88 | HR 80 | Ht 67.0 in | Wt 178.6 lb

## 2023-10-12 DIAGNOSIS — F411 Generalized anxiety disorder: Secondary | ICD-10-CM

## 2023-10-12 DIAGNOSIS — E782 Mixed hyperlipidemia: Secondary | ICD-10-CM

## 2023-10-12 DIAGNOSIS — F902 Attention-deficit hyperactivity disorder, combined type: Secondary | ICD-10-CM

## 2023-10-12 DIAGNOSIS — Z09 Encounter for follow-up examination after completed treatment for conditions other than malignant neoplasm: Secondary | ICD-10-CM

## 2023-10-12 DIAGNOSIS — F4321 Adjustment disorder with depressed mood: Secondary | ICD-10-CM | POA: Diagnosis not present

## 2023-10-12 DIAGNOSIS — F321 Major depressive disorder, single episode, moderate: Secondary | ICD-10-CM

## 2023-10-12 DIAGNOSIS — Z79899 Other long term (current) drug therapy: Secondary | ICD-10-CM | POA: Diagnosis not present

## 2023-10-12 DIAGNOSIS — G459 Transient cerebral ischemic attack, unspecified: Secondary | ICD-10-CM

## 2023-10-12 MED ORDER — ROSUVASTATIN CALCIUM 10 MG PO TABS: 10.0000 mg | ORAL_TABLET | Freq: Every day | ORAL | 3 refills | Status: AC

## 2023-10-12 MED ORDER — AMPHETAMINE-DEXTROAMPHET ER 20 MG PO CP24: 20.0000 mg | ORAL_CAPSULE | Freq: Every day | ORAL | 0 refills | Status: DC

## 2023-10-12 MED ORDER — PANTOPRAZOLE SODIUM 40 MG PO TBEC: 40.0000 mg | DELAYED_RELEASE_TABLET | Freq: Two times a day (BID) | ORAL | 3 refills | Status: DC

## 2023-10-12 MED ORDER — ROPINIROLE HCL 0.25 MG PO TABS: ORAL_TABLET | ORAL | 0 refills | Status: DC

## 2023-10-12 NOTE — Addendum Note (Signed)
 Addended by: MARYLYNN VERNEITA CROME on: 10/12/2023 02:15 PM   Modules accepted: Orders

## 2023-10-12 NOTE — Assessment & Plan Note (Signed)
Patient is stable post discharge and has no new issues or questions about discharge plans at the visit today for hospital follow up. All labs , imaging studies and progress notes from admission were reviewed with patient today   
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
## 2023-10-12 NOTE — Patient Instructions (Addendum)
 Reducing adderall  to 20 mg daily as a trial  Schedule  follow up with Darleene Sar  Reduce gabapentin  to 400 mg daily  ( omit one daily dose)   Referral to Viviane Drone MD for mindfulness,  etc   SET BOUNDARIES   Increase amlodipine  to 10 mg daily if BP is not < 130/80 by Friday

## 2023-10-12 NOTE — Assessment & Plan Note (Addendum)
 Her list of psychoactive drugs includes  Adderall, alprazolam  gabapentin , buspirone  , savella  and trazodone .  I have reduced her adderall dose and gabapentin  doses today . Given her historyof ADD fibromyalgia, and anxiety,  referring to Dr Chipper for assistance in reducing her regimen through increased involvement in nonpharmacologic treatment
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
## 2023-10-12 NOTE — Assessment & Plan Note (Signed)
 Occurred Oct 08 2023, presented with left sided facial weakness and dysarthria.  Continue statin. (Ldl is at goal),  adding baby asa.  Adivsed to increase amldoeipint o 10 mg on Friday (one week  post event) if BP remains  130/80

## 2023-10-14 ENCOUNTER — Encounter

## 2023-10-16 ENCOUNTER — Encounter: Payer: Self-pay | Admitting: Internal Medicine

## 2023-10-19 ENCOUNTER — Encounter: Payer: Self-pay | Admitting: *Deleted

## 2023-10-19 ENCOUNTER — Ambulatory Visit (INDEPENDENT_AMBULATORY_CARE_PROVIDER_SITE_OTHER): Admitting: *Deleted

## 2023-10-19 DIAGNOSIS — Z87891 Personal history of nicotine dependence: Secondary | ICD-10-CM

## 2023-10-19 MED ORDER — AMLODIPINE BESYLATE 10 MG PO TABS: 10.0000 mg | ORAL_TABLET | Freq: Every day | ORAL | 1 refills | Status: DC

## 2023-10-19 NOTE — Patient Instructions (Signed)

## 2023-10-19 NOTE — Telephone Encounter (Signed)
 I have pended the Amlodipine  10 mg for your approval.

## 2023-10-19 NOTE — Progress Notes (Signed)
 Virtual Visit via Telephone Note  I connected with Allison Alvarez on 10/19/23 at  8:00 AM EDT by telephone and verified that I am speaking with the correct person using two identifiers.  Location: Patient: in home Provider: [address] [place], [place], [address]     Shared Decision Making Visit Lung Cancer Screening Program [phone])   Eligibility: Age 63 y.o. Pack Years Smoking History Calculation 51 (# packs/per year x # years smoked) Recent History of coughing up blood  no Unexplained weight loss? no ( >Than 15 pounds within the last 6 months ) Prior History Lung / other cancer no (Diagnosis within the last 5 years already requiring surveillance chest CT Scans). Smoking Status Former Smoker Former Smokers: Years since quit: 4 years  Quit Date: 2021  Visit Components: Discussion included one or more decision making aids. yes Discussion included risk/benefits of screening. yes Discussion included potential follow up diagnostic testing for abnormal scans. yes Discussion included meaning and risk of over diagnosis. yes Discussion included meaning and risk of False Positives. yes Discussion included meaning of total radiation exposure. yes  Counseling Included: Importance of adherence to annual lung cancer LDCT screening. yes Impact of comorbidities on ability to participate in the program. yes Ability and willingness to under diagnostic treatment. yes  Smoking Cessation Counseling: Current Smokers:  Discussed importance of smoking cessation. yes Information about tobacco cessation classes and interventions provided to patient. yes Patient provided with ticket for LDCT Scan. yes Symptomatic Patient. no  CounselingNA Diagnosis Code: Tobacco Use Z72.0 Asymptomatic Patient yes  Counseling (Intermediate counseling: > three minutes counseling) [id number] Former Smokers:  Discussed the importance of maintaining cigarette abstinence. yes Diagnosis Code: Personal History  of Nicotine Dependence. S12.108 Information about tobacco cessation classes and interventions provided to patient. Yes Patient provided with ticket for LDCT Scan. yes Written Order for Lung Cancer Screening with LDCT placed in Epic. Yes (CT Chest Lung Cancer Screening Low Dose W/O CM) [id number] Z12.2-Screening of respiratory organs Z87.891-Personal history of nicotine dependence   Josette Ranger, RN 10/19/23

## 2023-10-20 ENCOUNTER — Ambulatory Visit
Admission: RE | Admit: 2023-10-20 | Discharge: 2023-10-20 | Disposition: A | Discharge status: Home / Self Care | Source: Ambulatory Visit | Attending: Internal Medicine | Admitting: Internal Medicine

## 2023-10-20 DIAGNOSIS — Z122 Encounter for screening for malignant neoplasm of respiratory organs: Secondary | ICD-10-CM | POA: Diagnosis not present

## 2023-10-20 DIAGNOSIS — Z87891 Personal history of nicotine dependence: Secondary | ICD-10-CM | POA: Insufficient documentation

## 2023-10-26 LAB — GENECONNECT MOLECULAR SCREEN: Genetic Analysis Overall Interpretation: NEGATIVE

## 2023-10-30 ENCOUNTER — Encounter: Payer: Self-pay | Admitting: Internal Medicine

## 2023-11-01 ENCOUNTER — Other Ambulatory Visit: Payer: Self-pay | Admitting: Internal Medicine

## 2023-11-01 NOTE — Telephone Encounter (Signed)
 Pt stated that since increasing her amlodipine  and Telimsartan she has noticed more swelling in her feet and ankles.

## 2023-11-02 ENCOUNTER — Encounter: Payer: Self-pay | Admitting: Nurse Practitioner

## 2023-11-02 ENCOUNTER — Telehealth: Payer: Self-pay

## 2023-11-02 ENCOUNTER — Ambulatory Visit: Payer: Self-pay | Admitting: *Deleted

## 2023-11-02 ENCOUNTER — Ambulatory Visit: Admitting: Nurse Practitioner

## 2023-11-02 ENCOUNTER — Other Ambulatory Visit: Payer: Self-pay | Admitting: Internal Medicine

## 2023-11-02 VITALS — BP 134/84 | HR 92 | Temp 98.7°F | Ht 67.0 in | Wt 177.0 lb

## 2023-11-02 DIAGNOSIS — I10 Essential (primary) hypertension: Secondary | ICD-10-CM | POA: Diagnosis not present

## 2023-11-02 DIAGNOSIS — R600 Localized edema: Secondary | ICD-10-CM | POA: Diagnosis not present

## 2023-11-02 DIAGNOSIS — M7989 Other specified soft tissue disorders: Secondary | ICD-10-CM

## 2023-11-02 MED ORDER — TELMISARTAN 80 MG PO TABS: 80.0000 mg | ORAL_TABLET | Freq: Every day | ORAL | 1 refills | Status: DC

## 2023-11-02 MED ORDER — NICOTINE 14 MG/24HR TD PT24: 14.0000 mg | MEDICATED_PATCH | Freq: Every day | TRANSDERMAL | 5 refills | Status: AC

## 2023-11-02 MED ORDER — AMLODIPINE BESYLATE 5 MG PO TABS: 5.0000 mg | ORAL_TABLET | Freq: Every day | ORAL | 1 refills | Status: DC

## 2023-11-02 NOTE — Progress Notes (Signed)
 Established Patient Office Visit  Subjective:  Patient ID: Allison Alvarez, female    DOB: 03-16-1961  Age: 63 y.o. MRN: [id number]  CC:  Chief Complaint  Patient presents with   Acute Visit    Patient was admitted to hospital overnight on 10/08/2023 with TIA. Now patient patient has swelling in lower legs, feet & ankles, and has experienced vertigo yesterday. Pt feels foggy in her head. BP still running high around 135/94 & having headaches.    HPI  Allison Alvarez presents for acute visit. Allison Alvarez is a 63 year old female with hypertension who presents with significant swelling in her feet and ankles.  Swelling in her feet and ankles began a week ago, initially in one foot and then the other. This coincides with an increase in telmisartan  from 40 mg to 80 mg and amlodipine  to 10 mg. Her blood pressure readings have been variable, with recent measurements of 135/84 and 143/95. She acknowledges consuming high-sodium foods and snacks, which may affect her blood pressure control.  She experiences vertigo and dizziness, with a significant episode the previous day lasting five minutes and accompanied by nausea. Occasional vertigo persists, but nausea is not currently present. She uses over-the-counter medication for symptom management.  She has a recent diagnosis of emphysema and has resumed smoking four to five cigarettes daily. Her demanding job as a Education officer, community contributes to stress, impacting her lifestyle and health choices.  HPI   Past Medical History:  Diagnosis Date   Allergy    Anemia 04/11/2022   Low potassium   Anxiety    Arthritis    Blood transfusion without reported diagnosis 2009   after total hysterectomy   Depression    Diverticulitis    Duodenal ulcer perforation (HCC) 04/23/2022   Fibromyalgia    Gastric perforation, acute 04/08/2022   GERD (gastroesophageal reflux disease) Age 19   GERD   Hyperlipidemia    Hypertension    Pelvic abscess in  female 04/21/2022   PONV (postoperative nausea and vomiting)    PVC (premature ventricular contraction)    Ulcer 04/08/2022   Acute perforation    Past Surgical History:  Procedure Laterality Date   ABDOMINAL HYSTERECTOMY  2007   APPENDECTOMY  2008   BLADDER REPAIR  2008   CHOLECYSTECTOMY     COLON RESECTION  11/2020   COLON SURGERY  2022   Partial resection   COLONOSCOPY     07/2016, 07/2020   ESOPHAGOGASTRODUODENOSCOPY  07/2020   SMALL INTESTINE SURGERY  04/08/2022   Perforation of duodenum   TUBAL LIGATION  1996   VESICOVAGINAL FISTULA CLOSURE  2008   XI ROBOT ASSISTED DIAGNOSTIC LAPAROSCOPY N/A 04/08/2022   Procedure: XI ROBOT ASSISTED DUODENAL ULCER PERFORATION REPAIR;  Surgeon: Rodolph Romano, MD;  Location: [facility] ORS;  Service: General;  Laterality: N/A;    Family History  Problem Relation Age of Onset   Hypertension Mother    Hyperlipidemia Mother    Depression Mother    Arthritis Mother    Mental illness Mother    Anxiety disorder Mother    Hearing loss Mother    Stroke Father    Early death Father    Depression Father    Arthritis Father    Hearing loss Father    Mental illness Brother    Drug abuse Brother    Depression Brother    Cancer Brother        skin cancer   Arthritis Brother  Anxiety disorder Daughter    ADD / ADHD Daughter    Anxiety disorder Son    Breast cancer Paternal Aunt 33   Hypertension Maternal Grandmother    Cancer Maternal Grandmother    Arthritis Maternal Grandmother    Diabetes Maternal Grandfather    Depression Maternal Grandfather    Cancer Paternal Grandfather    Arthritis Brother    Depression Brother    Drug abuse Brother    Kidney disease Brother    Cancer Brother    Cancer Paternal Aunt    Stroke Paternal Uncle    ADD / ADHD Son    Anxiety disorder Son     Social History   Socioeconomic History   Marital status: Married    Spouse name: Medford   Number of children: 2   Years of education: Not on  file   Highest education level: Bachelor's degree (e.g., BA, AB, BS)  Occupational History   Not on file  Tobacco Use   Smoking status: Former    Current packs/day: 0.00    Types: E-cigarettes, Cigarettes    Quit date: 01/28/2020    Years since quitting: 3.7   Smokeless tobacco: Never   Tobacco comments:    Quit 2.5 years ago.  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 [employer]   Financial Resource Strain: Low Risk  (09/28/2023)   Overall Financial Resource Strain (CARDIA)    Difficulty of Paying Living Expenses: Not very hard  Food Insecurity: No Food Insecurity (10/08/2023)   Hunger Vital Sign    Worried About Running Out of Food in the Last Year: Never true    Ran Out of Food in the Last Year: Never true  Transportation Needs: No Transportation Needs (10/08/2023)   PRAPARE - Administrator, Civil Service (Medical): No    Lack of Transportation (Non-Medical): No  Physical Activity: Inactive (09/28/2023)   Exercise Vital Sign    Days of Exercise per Week: 0 days    Minutes of Exercise per Session: Not on file  Stress: Stress Concern Present (09/28/2023)   [employer] of Occupational Health - Occupational Stress Questionnaire    Feeling of Stress: Rather much  Social Connections: Unknown (10/08/2023)   Social Connection and Isolation Panel    Frequency of Communication with Friends and Family: Patient declined    Frequency of Social Gatherings with Friends and Family: Patient declined    Attends Religious Services: Patient declined    Automotive engineer or Organizations: Patient declined    Attends Banker Meetings: Patient declined    Marital Status: Married  Catering manager Violence: Not At Risk (10/08/2023)   Humiliation, Afraid, Rape, and Kick questionnaire    Fear of Current or Ex-Partner: No    Emotionally Abused: No    Physically Abused: No    Sexually Abused: No     Outpatient Medications Prior to Visit  Medication Sig Dispense Refill   acetaminophen  (TYLENOL ) 325 MG tablet  Take 650 mg by mouth every 6 (six) hours as needed for moderate pain.     ALPRAZolam  (XANAX ) 0.25 MG tablet TAKE 1 TABLET BY MOUTH 2 TIMES A DAY AS NEEDED FOR ANXIETY 60 tablet 2   amLODipine  (NORVASC ) 5 MG tablet Take 1 tablet (5 mg total) by mouth daily. 90 tablet 1   amphetamine -dextroamphetamine  (ADDERALL XR) 20 MG 24 hr capsule Take 1 capsule (20 mg total) by mouth daily. 30 capsule 0   aspirin  EC 81 MG tablet Take 1 tablet (81 mg total) by mouth daily. Swallow whole. 30 tablet 12   busPIRone  (BUSPAR ) 15 MG tablet Take 1 tablet (15 mg total) by mouth 2 (two) times daily. 180 tablet 1   cyanocobalamin  (VITAMIN B12) 1000 MCG/ML injection Inject 1 mL (1,000 mcg total) into the muscle every 30 (thirty) days. 1 mL 11   diclofenac  Sodium (VOLTAREN  ARTHRITIS PAIN) 1 % GEL Apply 4 g topically 4 (four) times daily. 150 g 0   doxycycline  (VIBRAMYCIN ) 50 MG capsule TAKE 1 CAPSULE BY MOUTH DAILY 90 capsule 1   gabapentin  (NEURONTIN ) 100 MG capsule Take 1 capsule (100 mg total) by mouth 2 (two) times daily. 180 capsule 1   gabapentin  (NEURONTIN ) 300 MG capsule Take 1 capsule (300 mg total) by mouth every evening. 30 capsule 3   metoprolol  tartrate (LOPRESSOR ) 25 MG tablet TAKE 1 TABLET BY MOUTH 2 TIMES A DAY 180 tablet 0   ondansetron  (ZOFRAN -ODT) 4 MG disintegrating tablet Take 1 tablet (4 mg total) by mouth every 8 (eight) hours as needed for nausea or vomiting. 20 tablet 0   pantoprazole  (PROTONIX ) 40 MG tablet Take 1 tablet (40 mg  total) by mouth 2 (two) times daily. 180 tablet 3   predniSONE  (DELTASONE ) 10 MG tablet Take 40 mg by mouth daily.     rOPINIRole  (REQUIP ) 0.25 MG tablet TAKE ONE TABLET BY MOUTH EVERY EVENING AFTER DINNER AND AT BEDTIME. MAY INCREASE WEEKLY AS NEEDED. 60 tablet 0   rosuvastatin  (CRESTOR ) 10 MG tablet Take 1 tablet (10 mg total) by mouth daily. 90 tablet 3   SAVELLA  100 MG TABS tablet TAKE 1 TABLET BY MOUTH 2 TIMES A DAY 60 tablet 2   telmisartan  (MICARDIS ) 80 MG tablet Take 1 tablet (80 mg total) by mouth daily. 90 tablet 1   traZODone  (DESYREL ) 150 MG tablet Take 0.5 tablets (75 mg total) by mouth at bedtime. 90 tablet 3   No facility-administered medications prior to visit.    Allergies  Allergen Reactions   Morphine  Itching   Hydromorphone  Itching    ROS Review of Systems Negative unless indicated in HPI.    Objective:    Physical Exam Constitutional:      Appearance: Normal appearance.  Cardiovascular:     Rate and Rhythm: Normal rate and regular rhythm.     Pulses: Normal pulses.     Heart sounds: Normal heart sounds.  Musculoskeletal:     Cervical back: Normal range of motion.     Right lower leg: Edema present.     Left lower leg: Edema present.  Neurological:     General: No focal deficit present.     Mental Status: She is alert. Mental status is at baseline.  Psychiatric:        Mood and Affect: Mood normal.        Behavior: Behavior normal.        Thought Content: Thought content normal.        Judgment: Judgment  normal.     BP 134/84   Pulse 92   Temp 98.7 F (37.1 C)   Ht 5' 7 (1.702 m)   Wt 177 lb (80.3 kg)   SpO2 99%   BMI 27.72 kg/m  Wt Readings from Last 3 Encounters:  11/02/23 177 lb (80.3 kg)  10/20/23 173 lb (78.5 kg)  10/12/23 178 lb 9.6 oz (81 kg)     Health Maintenance  Topic Date Due   Hepatitis C Screening  Never done   Pneumococcal Vaccine: 50+ Years (2 of 2 - PCV) 12/19/2019   INFLUENZA VACCINE  11/12/2023   COVID-19  Vaccine (4 - 2024-25 season) 11/18/2023 (Originally 12/13/2022)   Lung Cancer Screening  10/19/2024   MAMMOGRAM  11/03/2025   Colonoscopy  07/13/2030   DTaP/Tdap/Td (3 - Td or Tdap) 08/31/2030   HIV Screening  Completed   Zoster Vaccines- Shingrix  Completed   Pneumococcal Vaccine: 19-49 Years  Aged Out   Hepatitis B Vaccines  Aged Out   HPV VACCINES  Aged Out   Meningococcal B Vaccine  Aged Out    There are no preventive care reminders to display for this patient.  Lab Results  Component Value Date   TSH 0.95 09/29/2023   Lab Results  Component Value Date   WBC 10.6 (H) 10/08/2023   HGB 11.9 (L) 10/08/2023   HCT 35.1 (L) 10/08/2023   MCV 97.0 10/08/2023   PLT 333 10/08/2023   Lab Results  Component Value Date   NA 140 10/08/2023   K 3.4 (L) 10/08/2023   CO2 27 10/08/2023   GLUCOSE 156 (H) 10/08/2023   BUN 16 10/08/2023   CREATININE 0.84 10/08/2023   BILITOT 0.6 10/08/2023   ALKPHOS 51 10/08/2023   AST 25 10/08/2023   ALT 28 10/08/2023   PROT 7.2 10/08/2023   ALBUMIN 4.2 10/08/2023   CALCIUM  9.1 10/08/2023   ANIONGAP 10 10/08/2023   GFR 67.27 09/29/2023   Lab Results  Component Value Date   CHOL 128 10/09/2023   Lab Results  Component Value Date   HDL 66 10/09/2023   Lab Results  Component Value Date   LDLCALC 42 10/09/2023   Lab Results  Component Value Date   TRIG 98 10/09/2023   Lab Results  Component Value Date   CHOLHDL 1.9 10/09/2023   Lab Results  Component Value Date   HGBA1C 5.8 09/29/2023      Assessment & Plan:  Lower extremity edema Assessment & Plan: Bilateral foot and ankle swelling likely due to increased amlodipine  dosage. - Decrease amlodipine  to 5 mg daily. - Will continue to monitor. -Patient will let us  know if swelling not improving.   Primary hypertension Assessment & Plan: Patient BP  Vitals:   11/02/23 1605  BP: 134/84   -Advised pt to follow a low sodium and heart healthy diet. Stress and diet may  contribute. - Monitor blood pressure regularly. - Encourage stress management techniques such as music therapy and setting boundaries.    Other orders -     Nicotine ; Place 1 patch (14 mg total) onto the skin daily.  Dispense: 30 patch; Refill: 5    Follow-up: Return in about 3 weeks (around 11/23/2023) for hypertension.   Xxavier Noon, NP

## 2023-11-02 NOTE — Patient Instructions (Addendum)
 YOUR PLAN:  PERIPHERAL EDEMA: Swelling in your feet and ankles likely due to increased amlodipine  dosage. -Decrease amlodipine  to 5 mg daily. -Continue telmisartan  80 mg daily.  HYPERTENSION: Your blood pressure readings have been elevated, possibly due to stress and diet. -Monitor your blood pressure regularly and f/up in 3 weeks. -Reduce salt intake in your diet. -Practice stress management techniques such as music therapy and setting boundaries. -Continue amlodipine  5 mg, telmisartan  80 mg and Metoprolol  25 mg daily .    -Schedule an appointment with Dr. Kapoor for wellness and psychiatric evaluation.

## 2023-11-02 NOTE — Telephone Encounter (Signed)
 FYI Only or Action Required?: Action required by provider: request for appointment.  Patient was last seen in primary care on 10/12/2023 by Marylynn Verneita CROME, MD.  Called Nurse Triage reporting Leg Swelling.  Symptoms began a week ago.  Interventions attempted: Prescription medications: telemisartan, metoprolol , amlodipine .  Symptoms are: gradually worsening.  Triage Disposition: See Physician Within 24 Hours  Patient/caregiver understands and will follow disposition?: Transferred into office- CAL halping with scheduling patient.   Reason for Disposition  [1] MODERATE leg swelling (e.g., swelling extends up to knees) AND [2] new-onset or getting worse  Answer Assessment - Initial Assessment Questions 1. ONSET: When did the swelling start? (e.g., minutes, hours, days)     Started 1 week ago- BP medication dosing was increased but still having elevated reading 2. LOCATION: What part of the leg is swollen?  Are both legs swollen or just one leg?     Started In left- more prominent- bilateral, lower leg/feet 3. SEVERITY: How bad is the swelling? (e.g., localized; mild, moderate, severe)     Severe during the day 4. REDNESS: Is there redness or signs of infection?     no 5. PAIN: Is the swelling painful to touch? If Yes, ask: How painful is it?   (Scale 1-10; mild, moderate or severe)     End of day painful- swelling does dissipate with elevation  6. FEVER: Do you have a fever? If Yes, ask: What is it, how was it measured, and when did it start?      no 7. CAUSE: What do you think is causing the leg swelling?     Edema- comes and goes 8. MEDICAL HISTORY: Do you have a history of blood clots (e.g., DVT), cancer, heart failure, kidney disease, or liver failure?     TIA hx 10/08/23 9. RECURRENT SYMPTOM: Have you had leg swelling before? If Yes, ask: When was the last time? What happened that time?     Yes- comes and goes 10. OTHER SYMPTOMS: Do you have any other  symptoms? (e.g., chest pain, difficulty breathing)       Vertigo- sitting with coffee- with standing- dizziness/nausea- lasted 5 minutes- yesterday- patient still feels mentally, BP 145/95, 135/94  Protocols used: Leg Swelling and Edema-A-AH   Copied from CRM #[id number]. Topic: Clinical - Red Word Triage >> Nov 02, 2023 10:18 AM Allison Alvarez wrote: Red Word that prompted transfer to Nurse Triage:Patient called in stated she is swelling in lower legs ankle and feet, has increased in her medication and after she increased it is when she noticed,

## 2023-11-02 NOTE — Telephone Encounter (Signed)
 [facility] Triage Nurse, Slater, called with patient on phone and would like to know if she can be seen today.  Patient states she has significant swelling in lower legs, ankles, and feet, particularly her left one.  Patient states she had vertigo yesterday.  Patient states she was admitted to hospital overnight on 10/08/2023 for TIA.  Patient states her head feels a little foggy today.  Slater states she has documented all of the patient's information in her note.  Slater states patient needs to be seen today.  Patient states she would prefer to not drive to [place].  I spoke with my supervisor, and with her permission and assistance, patient has been scheduled to see Chelsea Aurora, NP, today at 3:40pm.

## 2023-11-04 ENCOUNTER — Ambulatory Visit
Admission: RE | Admit: 2023-11-04 | Discharge: 2023-11-04 | Disposition: A | Discharge status: Home / Self Care | Source: Ambulatory Visit | Attending: Internal Medicine | Admitting: Internal Medicine

## 2023-11-04 DIAGNOSIS — Z1231 Encounter for screening mammogram for malignant neoplasm of breast: Secondary | ICD-10-CM | POA: Insufficient documentation

## 2023-11-12 NOTE — Assessment & Plan Note (Signed)
 Patient BP  Vitals:   11/02/23 1605  BP: 134/84   -Advised pt to follow a low sodium and heart healthy diet. Stress and diet may contribute. - Monitor blood pressure regularly. - Encourage stress management techniques such as music therapy and setting boundaries.

## 2023-11-12 NOTE — Telephone Encounter (Signed)
 BP is still elevated at 133/93 on 11/11/23. Swelling has subsided with reduction of the Amlodipine .Patient states this is the average BP reading she has been getting.

## 2023-11-12 NOTE — Telephone Encounter (Signed)
 Please call patient to follow-up on lower extremity swelling and blood pressure.

## 2023-11-12 NOTE — Assessment & Plan Note (Signed)
 Bilateral foot and ankle swelling likely due to increased amlodipine  dosage. - Decrease amlodipine  to 5 mg daily. - Will continue to monitor. -Patient will let us  know if swelling not improving.

## 2023-11-16 DIAGNOSIS — F17210 Nicotine dependence, cigarettes, uncomplicated: Secondary | ICD-10-CM | POA: Diagnosis not present

## 2023-11-22 ENCOUNTER — Other Ambulatory Visit: Payer: Self-pay

## 2023-11-22 DIAGNOSIS — R42 Dizziness and giddiness: Secondary | ICD-10-CM | POA: Diagnosis not present

## 2023-11-22 DIAGNOSIS — I10 Essential (primary) hypertension: Secondary | ICD-10-CM | POA: Insufficient documentation

## 2023-11-22 LAB — CBC WITH DIFFERENTIAL/PLATELET
Abs Immature Granulocytes: 0.02 K/uL (ref 0.00–0.07)
Basophils Absolute: 0.1 K/uL (ref 0.0–0.1)
Basophils Relative: 1 %
Eosinophils Absolute: 0.3 K/uL (ref 0.0–0.5)
Eosinophils Relative: 3 %
HCT: 35.5 % — ABNORMAL LOW (ref 36.0–46.0)
Hemoglobin: 11.8 g/dL — ABNORMAL LOW (ref 12.0–15.0)
Immature Granulocytes: 0 %
Lymphocytes Relative: 34 %
Lymphs Abs: 3.2 K/uL (ref 0.7–4.0)
MCH: 32.7 pg (ref 26.0–34.0)
MCHC: 33.2 g/dL (ref 30.0–36.0)
MCV: 98.3 fL (ref 80.0–100.0)
Monocytes Absolute: 0.5 K/uL (ref 0.1–1.0)
Monocytes Relative: 6 %
Neutro Abs: 5.4 K/uL (ref 1.7–7.7)
Neutrophils Relative %: 56 %
Platelets: 253 K/uL (ref 150–400)
RBC: 3.61 MIL/uL — ABNORMAL LOW (ref 3.87–5.11)
RDW: 12.5 % (ref 11.5–15.5)
WBC: 9.5 K/uL (ref 4.0–10.5)
nRBC: 0 % (ref 0.0–0.2)

## 2023-11-22 NOTE — ED Notes (Signed)
 Verbal order at this time from DR. Claudene

## 2023-11-22 NOTE — ED Triage Notes (Addendum)
 Pt reports HTN tonight, pt states hx of HTN pt denies missing any doses. Pt reports some dizziness as well that began around 2230. Pt states she had TIA aprox 1 month ago. Pt denies numbness or weakness, speech is clear. Pt has hx Vertigo, dizziness resolved with prescribed meclazine

## 2023-11-23 ENCOUNTER — Emergency Department
Admission: EM | Admit: 2023-11-23 | Discharge: 2023-11-23 | Disposition: A | Discharge status: Home / Self Care | Attending: Emergency Medicine | Admitting: Emergency Medicine

## 2023-11-23 DIAGNOSIS — R42 Dizziness and giddiness: Secondary | ICD-10-CM

## 2023-11-23 LAB — BASIC METABOLIC PANEL WITH GFR
Anion gap: 9 (ref 5–15)
BUN: 12 mg/dL (ref 8–23)
CO2: 28 mmol/L (ref 22–32)
Calcium: 9.6 mg/dL (ref 8.9–10.3)
Chloride: 105 mmol/L (ref 98–111)
Creatinine, Ser: 0.82 mg/dL (ref 0.44–1.00)
GFR, Estimated: >60 mL/min (ref >60)
Glucose, Bld: 132 mg/dL — ABNORMAL HIGH (ref 70–99)
Potassium: 4.1 mmol/L (ref 3.5–5.1)
Sodium: 142 mmol/L (ref 135–145)

## 2023-11-23 NOTE — ED Provider Notes (Signed)
 [facility] Provider Note    Event Date/Time   First MD Initiated Contact with Patient 11/23/23 [phone]     (approximate)   History   Hypertension and Dizziness   HPI  Allison Alvarez is a 63 y.o. female who presents to the ED for evaluation of Hypertension and Dizziness   Review medical DC summary from 6/28.  Admitted for possible TIA.  At baseline she is a history of fibromyalgia, anxiety, HTN, HLD.  Patient presents to the ED for evaluation of vertigo at home and concern for high blood pressure.  She reports an extraordinarily stressful day during work yesterday.  Felt okay in the evening after getting home from work, but later in the night developed vertigo and nausea.  She reports taking meclizine for this.  Reports developing 1 episode of nonbloody nonbilious emesis so she checked her blood pressure which was high, so she presents to the ED.  Reports feeling better by the time I see her   Physical Exam   Triage Vital Signs: ED Triage Vitals [11/22/23 2336]  Encounter Vitals Group     BP (!) 180/107     Girls Systolic BP Percentile      Girls Diastolic BP Percentile      Boys Systolic BP Percentile      Boys Diastolic BP Percentile      Pulse Rate 85     Resp 17     Temp 98.4 F (36.9 C)     Temp src      SpO2 100 %     Weight 173 lb (78.5 kg)     Height 5' 7 (1.702 m)     Head Circumference      Peak Flow      Pain Score 0     Pain Loc      Pain Education      Exclude from Growth Chart     Most recent vital signs: Vitals:   11/23/23 0300 11/23/23 0430  BP: (!) 133/90 121/75  Pulse: 79 74  Resp: 17 15  Temp:    SpO2: 100% 99%    General: Awake, no distress.  CV:  Good peripheral perfusion.  Resp:  Normal effort.  Abd:  No distention.  MSK:  No deformity noted.  Neuro:  No focal deficits appreciated.Cranial nerves II through XII intact 5/5 strength and sensation in all 4 extremities  Other:     ED Results / Procedures  / Treatments   Labs (all labs ordered are listed, but only abnormal results are displayed) Labs Reviewed  CBC WITH DIFFERENTIAL/PLATELET - Abnormal; Notable for the following components:      Result Value   RBC 3.61 (*)    Hemoglobin 11.8 (*)    HCT 35.5 (*)    All other components within normal limits  BASIC METABOLIC PANEL WITH GFR - Abnormal; Notable for the following components:   Glucose, Bld 132 (*)    All other components within normal limits    EKG Sinus rhythm with a rate of 90 bpm.  Normal axis, right bundle, no STEMI.  RADIOLOGY   Official radiology report(s): No results found.  PROCEDURES and INTERVENTIONS:  Procedures  Medications - No data to display   IMPRESSION / MDM / ASSESSMENT AND PLAN / ED COURSE  I reviewed the triage vital signs and the nursing notes.  Differential diagnosis includes, but is not limited to, acute stress reaction, BPPV, stroke, symptomatic anemia  {Patient presents with  symptoms of an acute illness or injury that is potentially life-threatening.  Patient presents with resolving vertiginous dizziness, without evidence of acute pathology and suitable for outpatient management.  Fairly hypertensive on arrival that self resolves.  Exam is reassuring without evidence of neurologic deficits.  Normal metabolic panel and CBC.  No persistent neurologic deficits or evidence of any endorgan damage from high blood pressure.  Suitable for outpatient management      FINAL CLINICAL IMPRESSION(S) / ED DIAGNOSES   Final diagnoses:  None     Rx / DC Orders   ED Discharge Orders     None        Note:  This document was prepared using Dragon voice recognition software and may include unintentional dictation errors.   Claudene Rover, MD 11/23/23 [phone]

## 2023-11-24 ENCOUNTER — Encounter: Payer: Self-pay | Admitting: Internal Medicine

## 2023-11-24 ENCOUNTER — Ambulatory Visit: Admitting: Internal Medicine

## 2023-11-24 VITALS — BP 118/80 | HR 84 | Ht 67.0 in | Wt 175.8 lb

## 2023-11-24 DIAGNOSIS — F902 Attention-deficit hyperactivity disorder, combined type: Secondary | ICD-10-CM

## 2023-11-24 DIAGNOSIS — I10 Essential (primary) hypertension: Secondary | ICD-10-CM | POA: Diagnosis not present

## 2023-11-24 DIAGNOSIS — F4321 Adjustment disorder with depressed mood: Secondary | ICD-10-CM | POA: Diagnosis not present

## 2023-11-24 DIAGNOSIS — Z79899 Other long term (current) drug therapy: Secondary | ICD-10-CM | POA: Diagnosis not present

## 2023-11-24 MED ORDER — METOPROLOL TARTRATE 50 MG PO TABS: 50.0000 mg | ORAL_TABLET | Freq: Two times a day (BID) | ORAL | 2 refills | Status: DC

## 2023-11-24 NOTE — Assessment & Plan Note (Signed)
 Gradual dose reduction planned.  Dose reduced to 20 mg in early July

## 2023-11-24 NOTE — Patient Instructions (Addendum)
 Continue 5 mg amlodipine   Increase your metoprolol  dose to 50 mg bid   You may take an additional dose of amlodipine  5 mg if you have an episode of elevated blood pressure (systolic > 150) AND AN ALPRAZOLAM   AS WELL  PLEASE CALL DR CHIPPER TODAY!!!!

## 2023-11-24 NOTE — Progress Notes (Signed)
 Subjective:  Patient ID: Allison Alvarez, female    DOB: [birth year]  Age: 63 y.o. MRN: [id number]  CC: The primary encounter diagnosis was Attention deficit hyperactivity disorder (ADHD), combined type. Diagnoses of Unresolved grief, Primary hypertension, and Polypharmacy were also pertinent to this visit.   HPI Allison Alvarez presents for  Chief Complaint  Patient presents with   Medical Management of Chronic Issues    3 week follow up on hypertension   1) HTN:  Allison Alvarez is a 63 yr old female with a history of HTN  ,fibromyalgia, ADD aggravated by concussion  GAD and depression with a recent history of TIA.SABRA  She  was  evaluated in the ER last evening  for hypertensive urgency after developing vertigo, nonbloody emesis  elevated blood pressure with  home BP reading of 180/112 .  By the time she was evaluated by ED physician she was feeling better, BP had trended down  and was discharged home without intervention. An EKG and BMET were done.  Patients states that she had had an extremely stressful day at work  after a week of rest . Once home,  she felt poorly,  checked blood pressure and it was  168/112. Tried to relax, eat dinner,  laid down,  but developed light headedness and a feeling of being  off balance, followed by feeling restless and nauseated .  Took mecliizine and tums,  symptoms improved for about 15 minutes, then returned. She developed vertigo, vomited,  took BP 180/112, and asked her husband to take her to the ER.  Vital signs in ER were same.  She states that she st in the ER for 5 hours without being monitored, BP was not rechecked until ER  physician saw her 5 hours later. \  2) MDD:  at her last visit  she was diagnosed with major depressive disorder, complicated by grief following the death of her stepfather last year.  She was  referred to dr Chipper. However, despite being  contacted,  she has not scheduled an appt yet. 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  She is taking multiple psychoactive drugs, including buspirone , Savella  (for fibromyalgia, which is helping( adderall (which she has relied on to improve her concentration since her concussion last year)      Outpatient Medications Prior to Visit  Medication Sig Dispense Refill   acetaminophen  (TYLENOL ) 325 MG tablet Take 650 mg by mouth every 6 (six) hours as needed for moderate pain.     ALPRAZolam  (XANAX ) 0.25 MG tablet TAKE 1 TABLET BY MOUTH 2 TIMES A DAY AS NEEDED FOR ANXIETY 60 tablet 2   amLODipine  (NORVASC ) 5 MG tablet Take 1 tablet (5 mg total) by mouth daily. 90 tablet 1   amphetamine -dextroamphetamine  (ADDERALL XR) 20 MG 24 hr capsule Take 1 capsule (20 mg total) by mouth daily. 30 capsule 0   aspirin  EC 81 MG tablet Take 1 tablet (81 mg total) by mouth daily. Swallow whole. 30 tablet 12   busPIRone  (BUSPAR ) 15 MG tablet Take 1 tablet (15 mg total) by mouth 2 (two) times daily. 180 tablet 1   cyanocobalamin  (VITAMIN B12) 1000 MCG/ML injection Inject 1 mL (1,000 mcg total) into the muscle every 30 (thirty) days. 1 mL 11   diclofenac  Sodium (VOLTAREN  ARTHRITIS PAIN) 1 % GEL Apply 4 g topically 4 (four) times  daily. 150 g 0   doxycycline  (VIBRAMYCIN ) 50 MG capsule TAKE 1 CAPSULE BY MOUTH DAILY 90 capsule 1   gabapentin  (NEURONTIN ) 100 MG capsule Take 1 capsule (100 mg total) by mouth 2 (two) times daily. 180 capsule 1   gabapentin  (NEURONTIN ) 300 MG capsule Take 1 capsule (300 mg total) by mouth every evening. 30 capsule 3   nicotine  (NICODERM CQ  - DOSED IN MG/24 HOURS) 14 mg/24hr patch Place 1 patch (14 mg total) onto the skin daily. 30 patch 5   ondansetron  (ZOFRAN -ODT) 4 MG disintegrating tablet Take 1 tablet (4 mg total) by mouth every 8 (eight) hours as needed for nausea or vomiting. 20 tablet 0   pantoprazole  (PROTONIX ) 40 MG tablet Take 1  tablet (40 mg total) by mouth 2 (two) times daily. 180 tablet 3   predniSONE  (DELTASONE ) 10 MG tablet Take 40 mg by mouth daily.     rOPINIRole  (REQUIP ) 0.25 MG tablet TAKE ONE TABLET BY MOUTH EVERY EVENING AFTER DINNER AND AT BEDTIME. MAY INCREASE WEEKLY AS NEEDED. 60 tablet 0   rosuvastatin  (CRESTOR ) 10 MG tablet Take 1 tablet (10 mg total) by mouth daily. 90 tablet 3   SAVELLA  100 MG TABS tablet TAKE 1 TABLET BY MOUTH 2 TIMES A DAY 60 tablet 2   telmisartan  (MICARDIS ) 80 MG tablet Take 1 tablet (80 mg total) by mouth daily. 90 tablet 1   traZODone  (DESYREL ) 150 MG tablet Take 0.5 tablets (75 mg total) by mouth at bedtime. 90 tablet 3   metoprolol  tartrate (LOPRESSOR ) 25 MG tablet TAKE 1 TABLET BY MOUTH 2 TIMES A DAY 180 tablet 0   No facility-administered medications prior to visit.    Review of Systems;  Patient denies headache, fevers, malaise, unintentional weight loss, skin rash, eye pain, sinus congestion and sinus pain, sore throat, dysphagia,  hemoptysis , cough, dyspnea, wheezing, chest pain, palpitations, orthopnea, edema, abdominal pain, nausea, melena, diarrhea, constipation, flank pain, dysuria, hematuria, urinary  Frequency, nocturia, numbness, tingling, seizures,  Focal weakness, Loss of consciousness,  Tremor, insomnia, depression, anxiety, and suicidal ideation.      Objective:  BP 118/80   Pulse 84   Ht 5' 7 (1.702 m)   Wt 175 lb 12.8 oz (79.7 kg)   SpO2 97%   BMI 27.53 kg/m   BP Readings from Last 3 Encounters:  11/24/23 118/80  11/23/23 121/75  11/02/23 134/84    Wt Readings from Last 3 Encounters:  11/24/23 175 lb 12.8 oz (79.7 kg)  11/22/23 173 lb (78.5 kg)  11/02/23 177 lb (80.3 kg)    Physical Exam Vitals reviewed.  Constitutional:      General: She is not in acute distress.    Appearance: Normal appearance. She is normal weight. She is not ill-appearing, toxic-appearing or diaphoretic.  HENT:     Head: Normocephalic.  Eyes:     General: No  scleral icterus.       Right eye: No discharge.        Left eye: No discharge.     Conjunctiva/sclera: Conjunctivae normal.  Cardiovascular:     Rate and Rhythm: Normal rate and regular rhythm.     Heart sounds: Normal heart sounds.  Pulmonary:     Effort: Pulmonary effort is normal. No respiratory distress.     Breath sounds: Normal breath sounds.  Musculoskeletal:        General: Normal range of motion.  Skin:    General: Skin is warm and dry.  Neurological:  General: No focal deficit present.     Mental Status: She is alert and oriented to person, place, and time. Mental status is at baseline.  Psychiatric:        Mood and Affect: Mood normal.        Behavior: Behavior normal.        Thought Content: Thought content normal.        Judgment: Judgment normal.     Lab Results  Component Value Date   HGBA1C 5.8 09/29/2023   HGBA1C 5.5 09/17/2021    Lab Results  Component Value Date   CREATININE 0.82 11/22/2023   CREATININE 0.84 10/08/2023   CREATININE 0.92 09/29/2023    Lab Results  Component Value Date   WBC 9.5 11/22/2023   HGB 11.8 (L) 11/22/2023   HCT 35.5 (L) 11/22/2023   PLT 253 11/22/2023   GLUCOSE 132 (H) 11/22/2023   CHOL 128 10/09/2023   TRIG 98 10/09/2023   HDL 66 10/09/2023   LDLDIRECT 76.0 09/29/2023   LDLCALC 42 10/09/2023   ALT 28 10/08/2023   AST 25 10/08/2023   NA 142 11/22/2023   K 4.1 11/22/2023   CL 105 11/22/2023   CREATININE 0.82 11/22/2023   BUN 12 11/22/2023   CO2 28 11/22/2023   TSH 0.95 09/29/2023   INR 1.0 10/08/2023   HGBA1C 5.8 09/29/2023   MICROALBUR 2.1 (H) 09/29/2023    No results found.  Assessment & Plan:  .Attention deficit hyperactivity disorder (ADHD), combined type Assessment & Plan: Gradual dose reduction planned.  Dose reduced to 20 mg in early July    Unresolved grief Assessment & Plan: Patient continues to grieve the loss of stepfather Signe, and now has a positive depression screen. She is NOT  suicidal    Primary hypertension Assessment & Plan: She notes that during her week away from work, her BP was <130/80 on 5 mg amlodipine  , 80 mg telmsartan and 25 mg metoprolol   bid. She did not tolerate higher doses of amlodipine  due to ankle edema.  She has been advised to increase metoprolol  to 50 mg bid .  Her elevated readings occur during high stress events.  Advised to take  an additional dose of amlodipine  and 0.25 mg alprazolam  for isolated readings >  150   Polypharmacy Assessment & Plan: Her list of psychoactive drugs includes  Adderall, alprazolam  gabapentin , buspirone  , savella  and trazodone .  I  reduced her adderall dose at ast visit to 20 mg as well as her  gabapentin  dose (for RLS).  . Given her historyof ADD fibromyalgia, and anxiety,  referring to Dr Chipper for assistance in reducing her regimen through increased involvement in nonpharmacologic treatment    Other orders -     Metoprolol  Tartrate; Take 1 tablet (50 mg total) by mouth 2 (two) times daily.  Dispense: 60 tablet; Refill: 2    I personally spent a total of 40 minutes in the care of the patient today including preparing to see the patient, getting/reviewing separately obtained history, performing a medically appropriate exam/evaluation, counseling patient on her ongoing emotional state and the dangers of continuing to prioritize her work over her health,  reviewing ER notes , labs and EKGs. documenting clinical information in the EHR, and independently interpreting results.  Follow-up: Return in about 3 months (around 02/24/2024).   Verneita LITTIE Kettering, MD

## 2023-11-25 ENCOUNTER — Other Ambulatory Visit: Payer: Self-pay | Admitting: *Deleted

## 2023-11-25 DIAGNOSIS — Z87891 Personal history of nicotine dependence: Secondary | ICD-10-CM

## 2023-11-25 DIAGNOSIS — Z122 Encounter for screening for malignant neoplasm of respiratory organs: Secondary | ICD-10-CM

## 2023-11-25 NOTE — Assessment & Plan Note (Addendum)
 She notes that during her week away from work, her BP was <130/80 on 5 mg amlodipine  , 80 mg telmsartan and 25 mg metoprolol   bid. She has not seen an elevation due to Adderall.  She did not tolerate higher doses of amlodipine  due to ankle edema.  She has been advised to increase metoprolol  to 50 mg bid .  Her elevated readings occur during high stress events.  Advised to take  an additional dose of amlodipine  and 0.25 mg alprazolam  for isolated readings >  150

## 2023-11-25 NOTE — Assessment & Plan Note (Signed)
 Her list of psychoactive drugs includes  Adderall, alprazolam  gabapentin , buspirone  , savella  and trazodone .  I  reduced her adderall dose at ast visit to 20 mg as well as her  gabapentin  dose (for RLS).  . Given her historyof ADD fibromyalgia, and anxiety,  referring to Dr Chipper for assistance in reducing her regimen through increased involvement in nonpharmacologic treatment

## 2023-11-25 NOTE — Assessment & Plan Note (Signed)
 Patient continues to grieve the loss of stepfather Signe, and now has a positive depression screen. She is NOT suicidal

## 2023-12-01 ENCOUNTER — Other Ambulatory Visit: Payer: Self-pay | Admitting: Internal Medicine

## 2023-12-02 ENCOUNTER — Encounter: Payer: Self-pay | Admitting: Internal Medicine

## 2023-12-02 DIAGNOSIS — M5431 Sciatica, right side: Secondary | ICD-10-CM

## 2023-12-02 DIAGNOSIS — M5432 Sciatica, left side: Secondary | ICD-10-CM

## 2023-12-02 DIAGNOSIS — G8929 Other chronic pain: Secondary | ICD-10-CM

## 2023-12-09 ENCOUNTER — Other Ambulatory Visit: Payer: Self-pay | Admitting: Internal Medicine

## 2023-12-09 MED ORDER — AMPHETAMINE-DEXTROAMPHET ER 20 MG PO CP24: 20.0000 mg | ORAL_CAPSULE | Freq: Every day | ORAL | 0 refills | Status: DC

## 2023-12-09 NOTE — Addendum Note (Signed)
 Addended by: MARYLYNN VERNEITA CROME on: 12/09/2023 01:41 PM   Modules accepted: Orders

## 2023-12-12 ENCOUNTER — Encounter: Payer: Self-pay | Admitting: Internal Medicine

## 2023-12-18 ENCOUNTER — Other Ambulatory Visit: Payer: Self-pay | Admitting: Internal Medicine

## 2023-12-20 ENCOUNTER — Encounter: Payer: Self-pay | Admitting: Internal Medicine

## 2023-12-20 NOTE — Telephone Encounter (Signed)
 Refilled: 09/09/2023 Last OV: 11/24/2023 Next OV: not scheduled

## 2023-12-21 ENCOUNTER — Other Ambulatory Visit: Payer: Self-pay | Admitting: Internal Medicine

## 2023-12-21 DIAGNOSIS — E782 Mixed hyperlipidemia: Secondary | ICD-10-CM

## 2023-12-21 NOTE — Telephone Encounter (Signed)
 Refilled: 08/19/2023 Last OV: 11/24/2023 Next OV: not scheduled

## 2023-12-22 MED ORDER — ALPRAZOLAM 0.25 MG PO TABS: 0.2500 mg | ORAL_TABLET | Freq: Two times a day (BID) | ORAL | 5 refills | Status: AC | PRN

## 2023-12-31 DIAGNOSIS — M25551 Pain in right hip: Secondary | ICD-10-CM | POA: Diagnosis not present

## 2023-12-31 DIAGNOSIS — M25562 Pain in left knee: Secondary | ICD-10-CM | POA: Diagnosis not present

## 2023-12-31 NOTE — Telephone Encounter (Unsigned)
 Copied from CRM [id number]. Topic: Referral - Status >> Dec 31, 2023  9:38 AM Pinkey ORN wrote: Reason for CRM: Update Referral >> Dec 31, 2023  9:41 AM Pinkey ORN wrote: Allison Alvarez [phone] / Fax # [phone] Called on behalf of patient, states they need an updated referral. It needs to say right side sciatica + left knee pain.. Patient is in there office now for her first eval.

## 2023-12-31 NOTE — Telephone Encounter (Signed)
 Referral re-faxed. Informed Randine with [facility] office.

## 2024-01-07 DIAGNOSIS — M25562 Pain in left knee: Secondary | ICD-10-CM | POA: Diagnosis not present

## 2024-01-07 DIAGNOSIS — M25551 Pain in right hip: Secondary | ICD-10-CM | POA: Diagnosis not present

## 2024-01-10 ENCOUNTER — Encounter: Payer: Self-pay | Admitting: Internal Medicine

## 2024-01-11 ENCOUNTER — Ambulatory Visit: Payer: Self-pay

## 2024-01-11 NOTE — Telephone Encounter (Signed)
 Patient is scheduled with Lauraine Buoy on 01/12/24.

## 2024-01-11 NOTE — Telephone Encounter (Signed)
 LM for patient. Needs acute visit to address elevated BP.

## 2024-01-11 NOTE — Telephone Encounter (Signed)
 FYI Only or Action Required?: FYI only for provider.  Patient was last seen in primary care on 11/24/2023 by Marylynn Verneita CROME, MD.  Called Nurse Triage reporting Hypertension.  Symptoms began several days ago.  Interventions attempted: Prescription medications: extra metoprolol  and anxiety medication.  Symptoms are: gradually worsening.  Triage Disposition: See Physician Within 24 Hours  Patient/caregiver understands and will follow disposition?: Yes    Copied from CRM #[id number]. Topic: Clinical - Red Word Triage >> Jan 11, 2024  4:19 PM Roselie BROCKS wrote: Red Word that prompted transfer to Nurse Triage: Patient is struggling to keep her blood pressure down, has a headache ,vertigo,cough and fatigue and nausea Reason for Disposition  Systolic BP >= 180 OR Diastolic >= 110  Answer Assessment - Initial Assessment Questions Pt called with hx of high BP but it has been consistently higher. 178/110 last week.Has had a cough/cold tested negative for flu/covid. She has been taking OTC cough/cold medicine. Rn did advise to use coricidin HBP for cold/cough because it doesn't increase BP like the others do. She advised the BP issues have been going on longer than he cold. Pt states she knows when her BP is elevated because she gets a headache and vertigo. She denies any symptoms currently.     1. BLOOD PRESSURE: What is your blood pressure? Did you take at least two measurements 5 minutes apart?     150/103 2. ONSET: When did you take your blood pressure?     While on the phone with RN 3. HOW: How did you take your blood pressure? (e.g., automatic home BP monitor, visiting nurse)     Automatic home bp monitor 4. HISTORY: Do you have a history of high blood pressure?     yes 5. MEDICINES: Are you taking any medicines for blood pressure? Have you missed any doses recently?     Been taking extra metoprolol  6. OTHER SYMPTOMS: Do you have any symptoms? (e.g., blurred vision,  chest pain, difficulty breathing, headache, weakness) Denies any other symptoms. Hx of stroke/tia: RN advised if any symptoms return to go to the ER. Pt stated understanding.  Protocols used: Blood Pressure - High-A-AH

## 2024-01-12 ENCOUNTER — Ambulatory Visit: Admitting: Family Medicine

## 2024-01-12 ENCOUNTER — Encounter: Payer: Self-pay | Admitting: Family Medicine

## 2024-01-12 VITALS — BP 132/80 | HR 76 | Ht 67.0 in | Wt 172.8 lb

## 2024-01-12 DIAGNOSIS — J4521 Mild intermittent asthma with (acute) exacerbation: Secondary | ICD-10-CM | POA: Diagnosis not present

## 2024-01-12 DIAGNOSIS — J069 Acute upper respiratory infection, unspecified: Secondary | ICD-10-CM | POA: Diagnosis not present

## 2024-01-12 DIAGNOSIS — F411 Generalized anxiety disorder: Secondary | ICD-10-CM

## 2024-01-12 DIAGNOSIS — I10 Essential (primary) hypertension: Secondary | ICD-10-CM

## 2024-01-12 MED ORDER — BUSPIRONE HCL 15 MG PO TABS: 15.0000 mg | ORAL_TABLET | Freq: Three times a day (TID) | ORAL | 0 refills | Status: AC

## 2024-01-12 MED ORDER — METHYLPREDNISOLONE 4 MG PO TBPK: ORAL_TABLET | ORAL | 0 refills | Status: DC

## 2024-01-12 NOTE — Assessment & Plan Note (Addendum)
 On Savella  100 mg twice daily, buspirone  15 mg daily. Has xanax  0.25 mg twice daily as needed. Increase buspirone  to 15 mg three times daily.

## 2024-01-12 NOTE — Progress Notes (Signed)
 Established patient visit   Patient: Allison Alvarez   DOB: 19-Feb-1961   63 y.o. Female  MRN: [id number] Visit Date: 01/12/2024  Today's healthcare provider: LAURAINE LOISE BUOY, DO   Chief Complaint  Patient presents with   Hypertension    Patient reports elevated  readings at home is usually late afternoon averaging 158/98.   Cough    X3 days   Subjective    Hypertension Pertinent negatives include no chest pain or shortness of breath.  Cough Associated symptoms include chills and a sore throat (mild). Pertinent negatives include no chest pain, ear pain, fever, postnasal drip, rhinorrhea or shortness of breath.    Allison Alvarez is a 63 year old female with hypertension, anxiety, and a history of TIA who presents with a cough and elevated blood pressure.  She has experienced a cough for the last three days. The cough is productive with phlegm and is accompanied by hoarseness and a sensation of fullness in the throat. No fever is present, but she mentions experiencing chills. There is no chest pain or shortness of breath. She feels stuffy in the head with some pressure on the top of her head. She has been taking over-the-counter liquid medication and Tessalon  Perles for the cough. - Covid and flu negative at home  During her previous visit with her primary care provider, Dr. Marylynn, she was advised to take an extra metoprolol  and a Xanax  if her blood pressure was elevated above a systolic of 150 mmHg.  Her blood pressure has been elevated, requiring her to take the additional medication. 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 She describes feeling 'discombobulated' and having difficulty with speech at the end of stressful days. She recalls a recent blood pressure reading of 178/110, after which she took metoprolol  and  Xanax . She was recently increased to 50 mg of metoprolol  in August and had amlodipine  reduced due to swelling.  She has a history of a TIA and a stroke, which heightens her concern about her blood pressure. She is currently on Savella  for fibromyalgia, anxiety and depression, and takes Adderall daily, which was restarted, after a years-long hiatus, after a concussion affected her cognitive function. She also takes Xanax  for anxiety/panic attacks.      Medications: Outpatient Medications Prior to Visit  Medication Sig   acetaminophen  (TYLENOL ) 325 MG tablet Take 650 mg by mouth every 6 (six) hours as needed for moderate pain.   ALPRAZolam  (XANAX ) 0.25 MG tablet Take 1 tablet (0.25 mg total) by mouth 2 (two) times daily as needed for anxiety.   amLODipine  (NORVASC ) 5 MG tablet Take 1 tablet (5 mg total) by mouth daily.   amphetamine -dextroamphetamine  (ADDERALL XR) 20 MG 24 hr capsule Take 1 capsule (20 mg total) by mouth daily.   aspirin  EC 81 MG tablet Take 1 tablet (81 mg total) by mouth daily. Swallow whole.   cyanocobalamin  (VITAMIN B12) 1000 MCG/ML injection Inject 1 mL (1,000 mcg total) into the muscle every 30 (thirty) days.   diclofenac  Sodium (VOLTAREN  ARTHRITIS PAIN) 1 % GEL Apply 4 g topically 4 (four) times daily.   doxycycline  (VIBRAMYCIN ) 50 MG capsule TAKE 1 CAPSULE BY MOUTH DAILY   gabapentin  (NEURONTIN ) 100 MG capsule Take 1 capsule (100 mg total) by mouth 2 (two) times daily.   gabapentin  (NEURONTIN ) 300 MG capsule Take  1 capsule (300 mg total) by mouth every evening.   metoprolol  tartrate (LOPRESSOR ) 50 MG tablet Take 1 tablet (50 mg total) by mouth 2 (two) times daily.   nicotine  (NICODERM CQ  - DOSED IN MG/24 HOURS) 14 mg/24hr patch Place 1 patch (14 mg total) onto the skin daily.   ondansetron  (ZOFRAN -ODT) 4 MG disintegrating tablet Take 1 tablet (4 mg total) by mouth every 8 (eight) hours as needed for nausea or vomiting.   pantoprazole  (PROTONIX ) 40 MG tablet Take 1  tablet (40 mg total) by mouth 2 (two) times daily.   rOPINIRole  (REQUIP ) 0.25 MG tablet TAKE ONE TABLET BY MOUTH EVERY EVENING AFTER DINNER AND AT BEDTIME *MAY INCREASE WEEKLY AS NEEDED*   rosuvastatin  (CRESTOR ) 10 MG tablet Take 1 tablet (10 mg total) by mouth daily.   SAVELLA  100 MG TABS tablet TAKE 1 TABLET BY MOUTH 2 TIMES A DAY   telmisartan  (MICARDIS ) 80 MG tablet Take 1 tablet (80 mg total) by mouth daily.   traZODone  (DESYREL ) 150 MG tablet Take 0.5 tablets (75 mg total) by mouth at bedtime.   [DISCONTINUED] busPIRone  (BUSPAR ) 15 MG tablet Take 1 tablet (15 mg total) by mouth 2 (two) times daily.   [DISCONTINUED] predniSONE  (DELTASONE ) 10 MG tablet Take 40 mg by mouth daily.   No facility-administered medications prior to visit.    Review of Systems  Constitutional:  Positive for chills. Negative for fever.  HENT:  Positive for sinus pressure (frontal) and sore throat (mild). Negative for congestion, ear pain, postnasal drip and rhinorrhea.   Respiratory:  Positive for cough. Negative for shortness of breath.   Cardiovascular:  Negative for chest pain.        Objective    BP 132/80 (BP Location: Left Arm, Patient Position: Sitting, Cuff Size: Normal)   Pulse 76   Ht 5' 7 (1.702 m)   Wt 172 lb 12.8 oz (78.4 kg)   SpO2 100%   BMI 27.06 kg/m     Physical Exam Constitutional:      Appearance: Normal appearance.  HENT:     Head: Normocephalic and atraumatic.  Eyes:     General: No scleral icterus.    Extraocular Movements: Extraocular movements intact.     Conjunctiva/sclera: Conjunctivae normal.  Cardiovascular:     Rate and Rhythm: Normal rate and regular rhythm.     Pulses: Normal pulses.     Heart sounds: Normal heart sounds.  Pulmonary:     Effort: Pulmonary effort is normal. No respiratory distress.     Breath sounds: Wheezing (mild) present.  Musculoskeletal:     Right lower leg: No edema.     Left lower leg: No edema.  Skin:    General: Skin is warm  and dry.  Neurological:     Mental Status: She is alert and oriented to person, place, and time. Mental status is at baseline.  Psychiatric:        Mood and Affect: Mood normal.        Behavior: Behavior normal.      No results found for any visits on 01/12/24.  Assessment & Plan    Primary hypertension Assessment & Plan: On telmisartan  80 mg daily, amlodipine  5 mg daily, and metoprolol  tartrate 50 mg twice daily.   GAD (generalized anxiety disorder) Assessment & Plan: On Savella  100 mg twice daily, buspirone  15 mg daily. Has xanax  0.25 mg twice daily as needed. Increase buspirone  to 15 mg three times daily.  Orders: -  busPIRone  HCl; Take 1 tablet (15 mg total) by mouth 3 (three) times daily.  Dispense: 270 tablet; Refill: 0  Acute upper respiratory infection -     methylPREDNISolone ; Take 6 pills on day 1, 5 pills on day 2, 4 pills on day 3, 3 pills on day 4, 2 pills on day 5, 1 pill on day 6  Dispense: 1 each; Refill: 0  Mild intermittent reactive airway disease with acute exacerbation -     methylPREDNISolone ; Take 6 pills on day 1, 5 pills on day 2, 4 pills on day 3, 3 pills on day 4, 2 pills on day 5, 1 pill on day 6  Dispense: 1 each; Refill: 0     Primary hypertension Hypertension with recent elevated readings, stress and anxiety may contribute. Current regimen includes metoprolol  and amlodipine . - Continue metoprolol  and amlodipine . - Monitor blood pressure, take additional metoprolol  and Xanax  for acute elevations. - Discussed lifestyle modifications to reduce stress and anxiety.  Generalized anxiety disorder Anxiety contributing to elevated blood pressure, exacerbated by work stress. On buspirone  and Savella , missed mental health appointment. - Increase buspirone  dosage to 3 times daily, with alternative option for twice daily plus additional dose as needed. - Reschedule mental health appointment.  Acute upper respiratory infection; mild intermittent  reactive airway disease with acute exacerbation Acute upper respiratory infection with productive cough and wheezing. Differential includes bronchitis. No fever, but chills present. - Prescribed short course of methylprednisone, advised holding Adderall during this period. - Advised supportive care: rest, hydration, nutrition. - Recommended mask use if returning to work. - Advised deep breathing to facilitate clearance of mucus. - Continue OTC cough medications: guaifenesin, dextromethorphan.     Return if symptoms worsen or fail to improve.      I discussed the assessment and treatment plan with the patient  The patient was provided an opportunity to ask questions and all were answered. The patient agreed with the plan and demonstrated an understanding of the instructions.   The patient was advised to call back or seek an in-person evaluation if the symptoms worsen or if the condition fails to improve as anticipated.    LAURAINE LOISE BUOY, DO  [facility] [phone] (phone) [phone] (fax)  [facility]

## 2024-01-12 NOTE — Assessment & Plan Note (Signed)
 On telmisartan  80 mg daily, amlodipine  5 mg daily, and metoprolol  tartrate 50 mg twice daily.

## 2024-01-13 ENCOUNTER — Other Ambulatory Visit: Payer: Self-pay | Admitting: Internal Medicine

## 2024-01-13 MED ORDER — CARVEDILOL 12.5 MG PO TABS: 12.5000 mg | ORAL_TABLET | Freq: Two times a day (BID) | ORAL | 3 refills | Status: DC

## 2024-01-21 DIAGNOSIS — M25551 Pain in right hip: Secondary | ICD-10-CM | POA: Diagnosis not present

## 2024-01-21 DIAGNOSIS — M25562 Pain in left knee: Secondary | ICD-10-CM | POA: Diagnosis not present

## 2024-01-27 ENCOUNTER — Ambulatory Visit
Admission: RE | Admit: 2024-01-27 | Discharge: 2024-01-27 | Disposition: A | Discharge status: Home / Self Care | Source: Ambulatory Visit | Attending: Internal Medicine | Admitting: Internal Medicine

## 2024-01-27 ENCOUNTER — Ambulatory Visit
Admission: RE | Admit: 2024-01-27 | Discharge: 2024-01-27 | Disposition: A | Discharge status: Home / Self Care | Attending: Internal Medicine | Admitting: Internal Medicine

## 2024-01-27 ENCOUNTER — Ambulatory Visit

## 2024-01-27 ENCOUNTER — Encounter: Payer: Self-pay | Admitting: Internal Medicine

## 2024-01-27 ENCOUNTER — Ambulatory Visit: Admitting: Internal Medicine

## 2024-01-27 VITALS — BP 130/86 | HR 94 | Ht 67.0 in | Wt 177.0 lb

## 2024-01-27 DIAGNOSIS — I499 Cardiac arrhythmia, unspecified: Secondary | ICD-10-CM

## 2024-01-27 DIAGNOSIS — I10 Essential (primary) hypertension: Secondary | ICD-10-CM

## 2024-01-27 DIAGNOSIS — R058 Other specified cough: Secondary | ICD-10-CM | POA: Diagnosis not present

## 2024-01-27 DIAGNOSIS — R9431 Abnormal electrocardiogram [ECG] [EKG]: Secondary | ICD-10-CM

## 2024-01-27 DIAGNOSIS — F411 Generalized anxiety disorder: Secondary | ICD-10-CM | POA: Diagnosis not present

## 2024-01-27 DIAGNOSIS — R059 Cough, unspecified: Secondary | ICD-10-CM | POA: Insufficient documentation

## 2024-01-27 DIAGNOSIS — R0602 Shortness of breath: Secondary | ICD-10-CM | POA: Diagnosis not present

## 2024-01-27 MED ORDER — AMOXICILLIN-POT CLAVULANATE 875-125 MG PO TABS: 1.0000 | ORAL_TABLET | Freq: Two times a day (BID) | ORAL | 0 refills | Status: DC

## 2024-01-27 MED ORDER — HYDROCOD POLI-CHLORPHE POLI ER 10-8 MG/5ML PO SUER: 5.0000 mL | Freq: Two times a day (BID) | ORAL | 0 refills | Status: DC | PRN
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
## 2024-01-27 NOTE — Patient Instructions (Addendum)
   I am starting you on augmentin   for the continued cough and tussionex for the cough   Please go get a chest x ray at [facility] on [address]

## 2024-01-28 ENCOUNTER — Ambulatory Visit: Attending: Internal Medicine

## 2024-01-28 DIAGNOSIS — I499 Cardiac arrhythmia, unspecified: Secondary | ICD-10-CM

## 2024-01-28 DIAGNOSIS — R9431 Abnormal electrocardiogram [ECG] [EKG]: Secondary | ICD-10-CM

## 2024-01-28 NOTE — Assessment & Plan Note (Addendum)
 Improved controll with increase in metoprolol  to 50 mg bid. And dc of adderall.    Elevations reported have occurred in the setting of incnreased work stressors .  No changes today

## 2024-01-28 NOTE — Assessment & Plan Note (Signed)
 Aggravated by unrelenting work stressors.  Buspirone  increased 2 weeks ago to 15 mg tid.  Self neglect noted (missed her mental health referrals)   30 day medical leave recommended

## 2024-01-28 NOTE — Assessment & Plan Note (Signed)
 Treated for viral URI 7 days ago,  sputum has now become purulent by report and cough has not improved.  R augmentin  .  Cxr needed to rule out CAP

## 2024-01-28 NOTE — Assessment & Plan Note (Signed)
 I have ordered and reviewed a 12 lead EKG and find that there are no acute changes and patient is in sinus rhythm.   Incomplete RBBB still present.    Given her episodes of tachycardia,  will order ZIO monitor

## 2024-01-30 ENCOUNTER — Ambulatory Visit: Payer: Self-pay | Admitting: Internal Medicine

## 2024-02-06 ENCOUNTER — Other Ambulatory Visit: Payer: Self-pay | Admitting: Internal Medicine

## 2024-02-16 DIAGNOSIS — I499 Cardiac arrhythmia, unspecified: Secondary | ICD-10-CM | POA: Diagnosis not present

## 2024-02-16 DIAGNOSIS — R9431 Abnormal electrocardiogram [ECG] [EKG]: Secondary | ICD-10-CM | POA: Diagnosis not present

## 2024-02-17 DIAGNOSIS — I499 Cardiac arrhythmia, unspecified: Secondary | ICD-10-CM

## 2024-02-17 DIAGNOSIS — R9431 Abnormal electrocardiogram [ECG] [EKG]: Secondary | ICD-10-CM | POA: Diagnosis not present

## 2024-02-21 ENCOUNTER — Encounter: Payer: Self-pay | Admitting: Internal Medicine

## 2024-02-21 ENCOUNTER — Ambulatory Visit: Admitting: Internal Medicine

## 2024-02-21 VITALS — BP 120/68 | HR 95 | Ht 67.0 in | Wt 171.8 lb

## 2024-02-21 DIAGNOSIS — I10 Essential (primary) hypertension: Secondary | ICD-10-CM | POA: Diagnosis not present

## 2024-02-21 DIAGNOSIS — M797 Fibromyalgia: Secondary | ICD-10-CM | POA: Diagnosis not present

## 2024-02-21 DIAGNOSIS — F321 Major depressive disorder, single episode, moderate: Secondary | ICD-10-CM

## 2024-02-21 DIAGNOSIS — F4310 Post-traumatic stress disorder, unspecified: Secondary | ICD-10-CM

## 2024-02-21 NOTE — Progress Notes (Unsigned)
 Subjective:  Patient ID: Allison Alvarez, female    DOB: [birth year]  Age: 63 y.o. MRN: [id number]  CC: There were no encounter diagnoses.   HPI Allison Alvarez presents for  Chief Complaint  Patient presents with   Medical Management of Chronic Issues    Follow up on blood pressure    1) FOLLOW UP ON EXHAUSTION ,  PALPITATIONS,  BRONCHITIS  HAS BEEN OUT OF WORK.  3 WEEKS AGO.  STILL HAVING VIVID NIGHTMARES , NOT SLEEPING WELL , AVERAGING 6 HOURS WITH LOTS OF INTERRUPTION . SABRA  MORE IRRITABLE   SINCE NEW BP MEDICATION STARTED, '   Husband  chris present: crankly all the time   Outpatient Medications Prior to Visit  Medication Sig Dispense Refill   acetaminophen  (TYLENOL ) 325 MG tablet Take 650 mg by mouth every 6 (six) hours as needed for moderate pain.     ALPRAZolam  (XANAX ) 0.25 MG tablet Take 1 tablet (0.25 mg total) by mouth 2 (two) times daily as needed for anxiety. 60 tablet 5   amLODipine  (NORVASC ) 5 MG tablet Take 1 tablet (5 mg total) by mouth daily. 90 tablet 1   aspirin  EC 81 MG tablet Take 1 tablet (81 mg total) by mouth daily. Swallow whole. 30 tablet 12   busPIRone  (BUSPAR ) 15 MG tablet Take 1 tablet (15 mg total) by mouth 3 (three) times daily. 270 tablet 0   carvedilol (COREG) 12.5 MG tablet Take 1 tablet (12.5 mg total) by mouth 2 (two) times daily with a meal. 60 tablet 3   cyanocobalamin  (VITAMIN B12) 1000 MCG/ML injection INJECT 1 ML INTRAMUSCULARLY EVERY 30 DAYS 3 mL 1   diclofenac  Sodium (VOLTAREN  ARTHRITIS PAIN) 1 % GEL Apply 4 g topically 4 (four) times daily. 150 g 0   gabapentin  (NEURONTIN ) 100 MG capsule Take 1 capsule (100 mg total) by mouth 2 (two) times daily. 180 capsule 1   gabapentin  (NEURONTIN ) 300 MG capsule TAKE 1 CAPSULE BY MOUTH EVERY EVENING 30 capsule 3   nicotine  (NICODERM CQ  - DOSED IN MG/24 HOURS) 14 mg/24hr patch Place 1 patch (14 mg total) onto the skin daily. 30 patch 5   ondansetron  (ZOFRAN -ODT) 4 MG disintegrating tablet Take 1 tablet (4 mg  total) by mouth every 8 (eight) hours as needed for nausea or vomiting. 20 tablet 0   pantoprazole  (PROTONIX ) 40 MG tablet Take 1 tablet (40 mg total) by mouth 2 (two) times daily. 180 tablet 3   rOPINIRole  (REQUIP ) 0.25 MG tablet TAKE ONE TABLET BY MOUTH EVERY EVENING AFTER DINNER AND AT BEDTIME *MAY INCREASE WEEKLY AS NEEDED* 60 tablet 5   rosuvastatin  (CRESTOR ) 10 MG tablet Take 1 tablet (10 mg total) by mouth daily. 90 tablet 3   SAVELLA  100 MG TABS tablet TAKE 1 TABLET BY MOUTH 2 TIMES A DAY 60 tablet 2   telmisartan  (MICARDIS ) 80 MG tablet Take 1 tablet (80 mg total) by mouth daily. 90 tablet 1   traZODone  (DESYREL ) 150 MG tablet Take 0.5 tablets (75 mg total) by mouth at bedtime. 90 tablet 3   amoxicillin -clavulanate (AUGMENTIN ) 875-125 MG tablet Take 1 tablet by mouth 2 (two) times daily. 14 tablet 0   amphetamine -dextroamphetamine  (ADDERALL XR) 20 MG 24 hr capsule Take 1 capsule (20 mg total) by mouth daily. (Patient not taking: Reported on 02/21/2024) 30 capsule 0   chlorpheniramine-HYDROcodone  (TUSSIONEX) 10-8 MG/5ML Take 5 mLs by mouth every 12 (twelve) hours as needed for cough. 140 mL 0   doxycycline  (VIBRAMYCIN ) 50  MG capsule TAKE 1 CAPSULE BY MOUTH DAILY 90 capsule 1   No facility-administered medications prior to visit.    Review of Systems;  Patient denies headache, fevers, malaise, unintentional weight loss, skin rash, eye pain, sinus congestion and sinus pain, sore throat, dysphagia,  hemoptysis , cough, dyspnea, wheezing, chest pain, palpitations, orthopnea, edema, abdominal pain, nausea, melena, diarrhea, constipation, flank pain, dysuria, hematuria, urinary  Frequency, nocturia, numbness, tingling, seizures,  Focal weakness, Loss of consciousness,  Tremor, insomnia, depression, anxiety, and suicidal ideation.      Objective:  BP 120/68   Pulse 95   Ht 5' 7 (1.702 m)   Wt 171 lb 12.8 oz (77.9 kg)   SpO2 95%   BMI 26.91 kg/m   BP Readings from Last 3 Encounters:   02/21/24 120/68  01/27/24 130/86  01/12/24 132/80    Wt Readings from Last 3 Encounters:  02/21/24 171 lb 12.8 oz (77.9 kg)  01/27/24 177 lb (80.3 kg)  01/12/24 172 lb 12.8 oz (78.4 kg)    Physical Exam  Lab Results  Component Value Date   HGBA1C 5.8 09/29/2023   HGBA1C 5.5 09/17/2021    Lab Results  Component Value Date   CREATININE 0.82 11/22/2023   CREATININE 0.84 10/08/2023   CREATININE 0.92 09/29/2023    Lab Results  Component Value Date   WBC 9.5 11/22/2023   HGB 11.8 (L) 11/22/2023   HCT 35.5 (L) 11/22/2023   PLT 253 11/22/2023   GLUCOSE 132 (H) 11/22/2023   CHOL 128 10/09/2023   TRIG 98 10/09/2023   HDL 66 10/09/2023   LDLDIRECT 76.0 09/29/2023   LDLCALC 42 10/09/2023   ALT 28 10/08/2023   AST 25 10/08/2023   NA 142 11/22/2023   K 4.1 11/22/2023   CL 105 11/22/2023   CREATININE 0.82 11/22/2023   BUN 12 11/22/2023   CO2 28 11/22/2023   TSH 0.95 09/29/2023   INR 1.0 10/08/2023   HGBA1C 5.8 09/29/2023   MICROALBUR 2.1 (H) 09/29/2023    DG Chest 2 View Result Date: 01/29/2024 CLINICAL DATA:  Productive cough for 3 weeks.  Shortness of breath. EXAM: CHEST - 2 VIEW COMPARISON:  07/23/2022 FINDINGS: The heart size and mediastinal contours are within normal limits. Both lungs are clear. The visualized skeletal structures are unremarkable. IMPRESSION: No active cardiopulmonary disease. Electronically Signed   By: Norleen DELENA Kil M.D.   On: 01/29/2024 18:19    Assessment & Plan:  .There are no diagnoses linked to this encounter.   I spent 34 minutes on the day of this face to face encounter reviewing patient's  most recent visit with cardiology,  nephrology,  and neurology,  prior relevant surgical and non surgical procedures, recent  labs and imaging studies, counseling on weight management,  reviewing the assessment and plan with patient, and post visit ordering and reviewing of  diagnostics and therapeutics with patient  .   Follow-up: No follow-ups on  file.   Verneita LITTIE Kettering, MD

## 2024-02-22 MED ORDER — TRAZODONE HCL 150 MG PO TABS
75.0000 mg | ORAL_TABLET | Freq: Every day | ORAL | 3 refills | Status: AC
Start: 2024-02-22 — End: ?

## 2024-02-22 MED ORDER — GABAPENTIN 100 MG PO CAPS: 100.0000 mg | ORAL_CAPSULE | Freq: Two times a day (BID) | ORAL | 1 refills | Status: AC
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
## 2024-02-22 NOTE — Assessment & Plan Note (Signed)
 Secondary to complicated grief following her stepfather's death and increased stressors at work.  Counselling given.  Advsed to extend LOA  to a full 6 weeks

## 2024-02-22 NOTE — Assessment & Plan Note (Signed)
 Improved control with removal from stressful environment.  Continue amlodipine  telmisartan  and carvedilol

## 2024-02-22 NOTE — Assessment & Plan Note (Signed)
 She has resumed treatment with [facility]  ,  stopping  sertraline .

## 2024-02-23 DIAGNOSIS — F4310 Post-traumatic stress disorder, unspecified: Secondary | ICD-10-CM | POA: Diagnosis not present

## 2024-02-23 DIAGNOSIS — Z8719 Personal history of other diseases of the digestive system: Secondary | ICD-10-CM | POA: Diagnosis not present

## 2024-02-23 DIAGNOSIS — K5909 Other constipation: Secondary | ICD-10-CM | POA: Diagnosis not present

## 2024-02-25 ENCOUNTER — Other Ambulatory Visit: Payer: Self-pay | Admitting: Internal Medicine

## 2024-02-28 ENCOUNTER — Encounter: Payer: Self-pay | Admitting: Internal Medicine

## 2024-03-02 NOTE — Telephone Encounter (Signed)
 November 30th is a Sunday. We are out of the office the Thursday and Friday before. Would it be okay to schedule on Tuesday at 9:30?

## 2024-03-03 NOTE — Telephone Encounter (Signed)
 Pt has been scheduled.

## 2024-03-07 ENCOUNTER — Ambulatory Visit: Admitting: Internal Medicine

## 2024-03-07 VITALS — BP 136/74 | HR 99 | Ht 67.0 in | Wt 173.6 lb

## 2024-03-07 DIAGNOSIS — F4310 Post-traumatic stress disorder, unspecified: Secondary | ICD-10-CM | POA: Diagnosis not present

## 2024-03-07 DIAGNOSIS — F411 Generalized anxiety disorder: Secondary | ICD-10-CM | POA: Diagnosis not present

## 2024-03-07 DIAGNOSIS — F331 Major depressive disorder, recurrent, moderate: Secondary | ICD-10-CM | POA: Diagnosis not present

## 2024-03-07 MED ORDER — AMLODIPINE BESYLATE 5 MG PO TABS: 5.0000 mg | ORAL_TABLET | Freq: Every day | ORAL | 1 refills | Status: AC

## 2024-03-07 MED ORDER — TELMISARTAN 80 MG PO TABS: 80.0000 mg | ORAL_TABLET | Freq: Every day | ORAL | 1 refills | Status: AC
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
## 2024-03-08 NOTE — Assessment & Plan Note (Signed)
 Suggested by new onset nightmares  uncontrolled hypertension which has now improved with res. Continue  current medications  encouraged to continue counselling .  Cleared for return to work Dec 1

## 2024-03-15 DIAGNOSIS — F411 Generalized anxiety disorder: Secondary | ICD-10-CM | POA: Diagnosis not present

## 2024-03-15 DIAGNOSIS — F331 Major depressive disorder, recurrent, moderate: Secondary | ICD-10-CM | POA: Diagnosis not present

## 2024-03-15 DIAGNOSIS — F4310 Post-traumatic stress disorder, unspecified: Secondary | ICD-10-CM | POA: Diagnosis not present

## 2024-03-22 DIAGNOSIS — F4310 Post-traumatic stress disorder, unspecified: Secondary | ICD-10-CM | POA: Diagnosis not present

## 2024-03-22 DIAGNOSIS — F331 Major depressive disorder, recurrent, moderate: Secondary | ICD-10-CM | POA: Diagnosis not present

## 2024-03-22 DIAGNOSIS — F411 Generalized anxiety disorder: Secondary | ICD-10-CM | POA: Diagnosis not present

## 2024-03-23 ENCOUNTER — Ambulatory Visit
Admission: EM | Admit: 2024-03-23 | Discharge: 2024-03-23 | Disposition: A | Discharge status: Home / Self Care | Attending: Emergency Medicine | Admitting: Emergency Medicine

## 2024-03-23 ENCOUNTER — Encounter: Payer: Self-pay | Admitting: Internal Medicine

## 2024-03-23 ENCOUNTER — Encounter: Payer: Self-pay | Admitting: Emergency Medicine

## 2024-03-23 ENCOUNTER — Ambulatory Visit: Payer: Self-pay

## 2024-03-23 DIAGNOSIS — J101 Influenza due to other identified influenza virus with other respiratory manifestations: Secondary | ICD-10-CM | POA: Insufficient documentation

## 2024-03-23 DIAGNOSIS — R3915 Urgency of urination: Secondary | ICD-10-CM | POA: Diagnosis not present

## 2024-03-23 DIAGNOSIS — R000 Tachycardia, unspecified: Secondary | ICD-10-CM | POA: Diagnosis present

## 2024-03-23 LAB — POC COVID19/FLU A&B COMBO
Covid Antigen, POC: NEGATIVE
Influenza A Antigen, POC: POSITIVE — AB
Influenza B Antigen, POC: NEGATIVE

## 2024-03-23 LAB — POCT URINE DIPSTICK
Bilirubin, UA: NEGATIVE
Glucose, UA: NEGATIVE mg/dL
Ketones, POC UA: NEGATIVE mg/dL
Nitrite, UA: NEGATIVE
POC PROTEIN,UA: 30 — AB
Spec Grav, UA: 1.025 (ref 1.010–1.025)
Urobilinogen, UA: 0.2 U/dL
pH, UA: 5.5 (ref 5.0–8.0)

## 2024-03-23 MED ORDER — OSELTAMIVIR PHOSPHATE 75 MG PO CAPS: 75.0000 mg | ORAL_CAPSULE | Freq: Two times a day (BID) | ORAL | 0 refills | Status: AC

## 2024-03-23 MED ORDER — PREDNISONE 10 MG (21) PO TBPK: ORAL_TABLET | Freq: Every day | ORAL | 0 refills | Status: AC

## 2024-03-23 NOTE — ED Provider Notes (Addendum)
 CAY RALPH PELT    CSN: [id number] Arrival date & time: 03/23/24  1403      History   Chief Complaint Chief Complaint  Patient presents with   Fatigue   Cough   Fever   Headache   Generalized Body Aches   Wheezing    HPI Allison Alvarez is a 63 y.o. female.   Patient presents for evaluation of a fever peaking at 102, nasal congestion, productive cough, shortness of breath with activity and intermittent wheezing present for 2 days.  Has also experienced a constant headache and right-sided ear fullness but denies presence of ear pain.  Known exposure to influenza, has taken COVID and flu testing x 3 at home which were all negative.  Beginning 1 day ago notified by her watch that heart rate increasing greater than 100 fluctuating as high as 115, set up yesterday evening then had 1 occurrence overnight waking her up from the sleep.  Symptom has resolved.  Has attempted Coricidin.  Decreased appetite but tolerable to food and liquids, and an attempt to decrease caffeine  intake has been drinking cranberry juice.  Concerned with urinary frequency and dysuria occurring for at least 2 weeks.  Endorses a history of UTI.  Denies hematuria, abdominal or flank pain, fever or vaginal symptoms.   Past Medical History:  Diagnosis Date   Allergy Age 9   mold and dust   Anemia 04/11/2022   Low potassium   Anxiety 27   stable with meds   Arthritis age 70   Blood transfusion without reported diagnosis 2009   after total hysterectomy   Depression age 23   stable   Diverticulitis    Duodenal ulcer perforation (HCC) 04/23/2022   Fibromyalgia    Gastric perforation, acute 04/08/2022   GERD (gastroesophageal reflux disease) Age 54   GERD   Hyperlipidemia    Hypertension age 29   stable with meds   Pelvic abscess in female 04/21/2022   PONV (postoperative nausea and vomiting)    PVC (premature ventricular contraction)    Ulcer 04/08/2022   Acute perforation    Patient Active  Problem List   Diagnosis Date Noted   PTSD (post-traumatic stress disorder) 02/21/2024   Polypharmacy 10/12/2023   TIA (transient ischemic attack) 10/08/2023   Facial asymmetry 10/08/2023   Hyperlipidemia 10/08/2023   Encounter for general adult medical examination with abnormal findings 09/30/2023   Chronic hip pain, right 09/30/2023   S/P total hysterectomy 09/29/2023   Knee pain, right 06/07/2023   Chronic pain of left knee 05/25/2023   Sciatica 05/25/2023   Unresolved grief 12/23/2022   RLS (restless legs syndrome) 12/23/2022   Acne cystica 12/23/2022   Cough in adult patient 05/21/2022   Anemia due to GI blood loss 05/21/2022   Hospital discharge follow-up 05/04/2022   Abnormal electrocardiogram (ECG) (EKG) 04/21/2022   Lower extremity edema 11/12/2021   History of concussion 10/13/2021   Esophageal dysphagia 09/17/2021   Primary hypertension 02/24/2018   Diverticulosis of intestine without bleeding 05/07/2017   Fibromyalgia 05/07/2017   GAD (generalized anxiety disorder) 10/31/2014   Major depressive disorder with single episode 10/31/2014   Neurodermatitis 10/31/2014   Tobacco use disorder 10/31/2014   Urgency incontinence 02/28/2013   Incontinence without sensory awareness 08/22/2009   Attention deficit disorder 08/16/2008    Past Surgical History:  Procedure Laterality Date   ABDOMINAL HYSTERECTOMY  2009   [place] vaginal fistula resulting in bladder repair surgery   APPENDECTOMY  2010   BLADDER REPAIR  2008   CHOLECYSTECTOMY     COLON RESECTION  11/2020   COLON SURGERY  2022   Partial resection   COLONOSCOPY     07/2016, 07/2020   ESOPHAGOGASTRODUODENOSCOPY  07/2020   SMALL INTESTINE SURGERY  04/08/2022   Perforation of duodenum   TUBAL LIGATION  1996   VESICOVAGINAL FISTULA CLOSURE  2008   XI ROBOT ASSISTED DIAGNOSTIC LAPAROSCOPY N/A 04/08/2022   Procedure: XI ROBOT ASSISTED DUODENAL ULCER PERFORATION REPAIR;  Surgeon: Rodolph Romano, MD;  Location:  [facility] ORS;  Service: General;  Laterality: N/A;    OB History   No obstetric history on file.      Home Medications    Prior to Admission medications  Medication Sig Start Date End Date Taking? Authorizing Provider  acetaminophen  (TYLENOL ) 325 MG tablet Take 650 mg by mouth every 6 (six) hours as needed for moderate pain.    [provider]  ALPRAZolam  (XANAX ) 0.25 MG tablet Take 1 tablet (0.25 mg total) by mouth 2 (two) times daily as needed for anxiety. 12/22/23   Marylynn Verneita CROME, MD  amLODipine  (NORVASC ) 5 MG tablet Take 1 tablet (5 mg total) by mouth daily. 03/07/24   Marylynn Verneita CROME, MD  aspirin  EC 81 MG tablet Take 1 tablet (81 mg total) by mouth daily. Swallow whole. 10/10/23   Patel, Sona, MD  busPIRone  (BUSPAR ) 15 MG tablet Take 1 tablet (15 mg total) by mouth 3 (three) times daily. 01/12/24   Donzella Lauraine SAILOR, DO  carvedilol  (COREG ) 12.5 MG tablet Take 1 tablet (12.5 mg total) by mouth 2 (two) times daily with a meal. 01/13/24   Marylynn Verneita CROME, MD  cyanocobalamin  (VITAMIN B12) 1000 MCG/ML injection INJECT 1 ML INTRAMUSCULARLY EVERY 30 DAYS 02/09/24   Tullo, Teresa L, MD  diclofenac  Sodium (VOLTAREN  ARTHRITIS PAIN) 1 % GEL Apply 4 g topically 4 (four) times daily. 06/07/23   Narendra, Nischal, MD  gabapentin  (NEURONTIN ) 100 MG capsule Take 1 capsule (100 mg total) by mouth 2 (two) times daily. 02/22/24   Marylynn Verneita CROME, MD  gabapentin  (NEURONTIN ) 300 MG capsule TAKE 1 CAPSULE BY MOUTH EVERY EVENING 02/09/24   Marylynn Verneita CROME, MD  nicotine  (NICODERM CQ  - DOSED IN MG/24 HOURS) 14 mg/24hr patch Place 1 patch (14 mg total) onto the skin daily. 11/02/23   Kaur, Charanpreet, NP  ondansetron  (ZOFRAN -ODT) 4 MG disintegrating tablet Take 1 tablet (4 mg total) by mouth every 8 (eight) hours as needed for nausea or vomiting. 07/23/22   Angelena Smalls, MD  rOPINIRole  (REQUIP ) 0.25 MG tablet TAKE ONE TABLET BY MOUTH EVERY EVENING AFTER DINNER AND AT BEDTIME *MAY INCREASE WEEKLY AS NEEDED*  12/02/23   Marylynn Verneita CROME, MD  rosuvastatin  (CRESTOR ) 10 MG tablet Take 1 tablet (10 mg total) by mouth daily. 10/12/23   Marylynn Verneita CROME, MD  SAVELLA  100 MG TABS tablet TAKE 1 TABLET BY MOUTH 2 TIMES A DAY 12/20/23   Webb, Padonda B, FNP  telmisartan  (MICARDIS ) 80 MG tablet Take 1 tablet (80 mg total) by mouth daily. 03/07/24   Marylynn Verneita CROME, MD  traZODone  (DESYREL ) 150 MG tablet Take 0.5 tablets (75 mg total) by mouth at bedtime. 02/22/24   Marylynn Verneita CROME, MD    Family History Family History  Problem Relation Age of Onset   Hypertension Mother    Hyperlipidemia Mother    Depression Mother    Arthritis Mother    Mental illness Mother    Anxiety disorder Mother  Hearing loss Mother    Stroke Father    Early death Father    Depression Father    Arthritis Father    Hearing loss Father    Mental illness Brother    Drug abuse Brother    Depression Brother    Cancer Brother        skin cancer   Arthritis Brother    Anxiety disorder Daughter    ADD / ADHD Daughter    Anxiety disorder Son    Breast cancer Paternal Aunt 40   Hypertension Maternal Grandmother    Cancer Maternal Grandmother    Arthritis Maternal Grandmother    Diabetes Maternal Grandfather    Depression Maternal Grandfather    Cancer Paternal Grandfather    Arthritis Brother    Depression Brother    Drug abuse Brother    Kidney disease Brother    Cancer Brother    Cancer Paternal Aunt    Stroke Paternal Uncle    ADD / ADHD Son    Anxiety disorder Son     Social History Social History[1]   Allergies   Morphine  and Hydromorphone    Review of Systems Review of Systems  Constitutional:  Positive for fever. Negative for activity change, appetite change, chills, diaphoresis, fatigue and unexpected weight change.  HENT:  Positive for congestion. Negative for dental problem, drooling, ear discharge, ear pain, facial swelling, hearing loss, mouth sores, nosebleeds, postnasal drip, rhinorrhea, sinus pressure,  sinus pain, sneezing, sore throat, tinnitus, trouble swallowing and voice change.   Respiratory:  Positive for cough, shortness of breath and wheezing. Negative for apnea, choking, chest tightness and stridor.   Neurological:  Positive for headaches. Negative for dizziness, tremors, seizures, syncope, facial asymmetry, speech difficulty, weakness, light-headedness and numbness.     Physical Exam Triage Vital Signs ED Triage Vitals  Encounter Vitals Group     BP 03/23/24 1430 107/74     Girls Systolic BP Percentile --      Girls Diastolic BP Percentile --      Boys Systolic BP Percentile --      Boys Diastolic BP Percentile --      Pulse Rate 03/23/24 1430 94     Resp 03/23/24 1430 20     Temp 03/23/24 1430 100.2 F (37.9 C)     Temp Source 03/23/24 1430 Oral     SpO2 03/23/24 1430 95 %     Weight --      Height --      Head Circumference --      Peak Flow --      Pain Score 03/23/24 1427 6     Pain Loc --      Pain Education --      Exclude from Growth Chart --    No data found.  Updated Vital Signs BP 107/74 (BP Location: Left Arm)   Pulse 94   Temp 100.2 F (37.9 C) (Oral)   Resp 20   SpO2 95%   Visual Acuity Right Eye Distance:   Left Eye Distance:   Bilateral Distance:    Right Eye Near:   Left Eye Near:    Bilateral Near:     Physical Exam Constitutional:      Appearance: Normal appearance.  HENT:     Head: Normocephalic.     Right Ear: Tympanic membrane, ear canal and external ear normal.     Left Ear: Tympanic membrane, ear canal and external ear normal.     Nose:  Congestion present. No rhinorrhea.     Mouth/Throat:     Pharynx: No oropharyngeal exudate or posterior oropharyngeal erythema.  Eyes:     Extraocular Movements: Extraocular movements intact.  Cardiovascular:     Rate and Rhythm: Normal rate and regular rhythm.     Pulses: Normal pulses.     Heart sounds: Normal heart sounds.  Pulmonary:     Effort: Pulmonary effort is normal.      Breath sounds: Wheezing present.  Abdominal:     Tenderness: There is no abdominal tenderness. There is no right CVA tenderness, left CVA tenderness or guarding.  Neurological:     Mental Status: She is alert and oriented to person, place, and time. Mental status is at baseline.      UC Treatments / Results  Labs (all labs ordered are listed, but only abnormal results are displayed) Labs Reviewed  POC COVID19/FLU A&B COMBO - Abnormal; Notable for the following components:      Result Value   Influenza A Antigen, POC Positive (*)    All other components within normal limits    EKG   Radiology No results found.  Procedures Procedures (including critical care time)  Medications Ordered in UC Medications - No data to display  Initial Impression / Assessment and Plan / UC Course  I have reviewed the triage vital signs and the nursing notes.  Pertinent labs & imaging results that were available during my care of the patient were reviewed by me and considered in my medical decision making (see chart for details).  Influenza A, tachycardia, urinary frequency  Patient is in no signs of distress nor toxic appearing.  Vital signs are stable.  Low suspicion for pneumonia, pneumothorax or bronchitis and therefore will defer imaging.  COVID testing negative, discussed findings, advise quarantining if feverish.  EKG shows normal sinus rhythm, no change from prior EKG, low suspicion for cardiac involvement, most likely related to persistent wheezing which has been heard on exam here in clinic prescribed Tamiflu and prednisone , declined inhaler.May use additional over-the-counter medications as needed for supportive care.  May follow-up with urgent care as needed if symptoms persist or worsen.  Urinalysis negative, sent for culture will initiate antibiotics based on results, discussed   Final Clinical Impressions(s) / UC Diagnoses   Final diagnoses:  Viral illness  Tachycardia    Discharge Instructions   None    ED Prescriptions   None    PDMP not reviewed this encounter.    Teresa Shelba SAUNDERS, NP 03/23/24 1523     [1]  Social History Tobacco Use   Smoking status: Former    Current packs/day: 0.00    Average packs/day: 1.0 packs/day    Types: E-cigarettes, Cigarettes    Quit date: 01/28/2020    Years since quitting: 4.1   Smokeless tobacco: Never   Tobacco comments:    Quit 2.5 years ago.  Still vaping  Vaping Use   Vaping status: Every Day   Substances: Nicotine , Flavoring  Substance Use Topics   Alcohol use: Yes    Alcohol/week: 2.0 standard drinks of alcohol    Types: 2 Glasses of wine per week    Comment: occasionally   Drug use: No     Teresa Shelba SAUNDERS, NP 03/23/24 1524

## 2024-03-23 NOTE — Telephone Encounter (Signed)
 Pt stated that she is going to UC

## 2024-03-23 NOTE — Discharge Instructions (Addendum)
 Influenza A is a virus and should steadily improve in time it can take up to 7 to 10 days before you truly start to see a turnaround however things will get better  Urinalysis is negative, has been sent to the lab to determine if bacteria will grow, pending for 3 days and you will be notified of any bacterial growth and started on antibiotics  Begin Tamiflu every morning and every evening for 5 days to reduce the amount of virus in the body which helps to minimize symptoms  You do need to stay home and quarantine if experiencing fever, if no fever you may continue activity wearing mask for 5 days from start of symptoms  To open and relax the airway begin prednisone  every morning with food as directed, avoid ibuprofen  but may use Tylenol  if needed    You can take Tylenol  as needed for fever reduction and pain relief.   For cough: honey 1/2 to 1 teaspoon (you can dilute the honey in water or another fluid).  You can also use guaifenesin and dextromethorphan for cough. You can use a humidifier for chest congestion and cough.  If you don't have a humidifier, you can sit in the bathroom with the hot shower running.      For sore throat: try warm salt water gargles, cepacol lozenges, throat spray, warm tea or water with lemon/honey, popsicles or ice, or OTC cold relief medicine for throat discomfort.   For congestion: take a daily anti-histamine like Zyrtec, Claritin, and a oral decongestant, such as pseudoephedrine.  You can also use Flonase  1-2 sprays in each nostril daily.   It is important to stay hydrated: drink plenty of fluids (water, gatorade/powerade/pedialyte, juices, or teas) to keep your throat moisturized and help further relieve irritation/discomfort.

## 2024-03-23 NOTE — ED Triage Notes (Signed)
 Patient reports heart racing in the middle of the night. Patient also complains fatigue, cough, fever, headache, body aches and wheezing x 2 days. Patient reports that she has taken 3 Covid/flu test at home and all was negative.  Rates headache 6/10, body aches 6/10.

## 2024-03-23 NOTE — Telephone Encounter (Addendum)
 FYI Only or Action Required?: FYI only for provider: Pt will go to UC.  Patient was last seen in primary care on 03/07/2024 by Marylynn Verneita CROME, MD.  Called Nurse Triage reporting Influenza exposure and s/s. 3 flu/covid tests have been negative.  Pt also experiencing tachycardia  Symptoms began several days ago.  Interventions attempted: Prescription medications: left over medication. Cold medication for pts with HTN.  Symptoms are: gradually worsening.  Triage Disposition: see today Patient/caregiver understands and will follow disposition?: Yes                     Copied from CRM #[id number]. Topic: Clinical - Red Word Triage >> Mar 23, 2024 10:52 AM Anairis L wrote: Red Word that prompted transfer to Nurse Triage: Horrible Headache cough, weak, headache, chills 102 fever, elevated heart rate.   Negative flu test. Reason for Disposition  [1] Influenza EXPOSURE (Close Contact) within last 48 hours (2 days) AND [2] exposed person is HIGH RISK (e.g., 65 years and older, pregnant, HIV+, chronic medical condition)  Answer Assessment - Initial Assessment Questions 1. TYPE of EXPOSURE: How were you exposed? (e.g., close contact, not a close contact)     Sunday - grandson has flu 2. DATE of EXPOSURE: When did the exposure occur? (e.g., hour, days, weeks)     Sunday 3. SYMPTOMS: Do you have any symptoms? (e.g., cough, fever, sore throat, difficulty breathing).     Elevated HR 100-115, HA, cough, chills fever 4. HIGH RISK for COMPLICATIONS: Do you have any heart or lung problems? Do you have a weakened immune system? (e.g., CHF, COPD, asthma, HIV positive, chemotherapy, renal failure, diabetes mellitus, sickle cell anemia)     HTN, Tachycardia, cough, wheezing  Protocols used: Influenza (Flu) Exposure-A-AH

## 2024-03-24 ENCOUNTER — Ambulatory Visit: Admitting: Physician Assistant

## 2024-03-25 LAB — URINE CULTURE: Culture: >=100000 — AB

## 2024-03-27 ENCOUNTER — Ambulatory Visit (HOSPITAL_COMMUNITY): Payer: Self-pay

## 2024-03-27 MED ORDER — NITROFURANTOIN MONOHYD MACRO 100 MG PO CAPS: 100.0000 mg | ORAL_CAPSULE | Freq: Two times a day (BID) | ORAL | 0 refills | Status: AC

## 2024-03-29 DIAGNOSIS — F411 Generalized anxiety disorder: Secondary | ICD-10-CM | POA: Diagnosis not present

## 2024-03-29 DIAGNOSIS — F331 Major depressive disorder, recurrent, moderate: Secondary | ICD-10-CM | POA: Diagnosis not present

## 2024-03-29 DIAGNOSIS — F4310 Post-traumatic stress disorder, unspecified: Secondary | ICD-10-CM | POA: Diagnosis not present

## 2024-03-30 NOTE — Telephone Encounter (Signed)
 Noted

## 2024-03-31 ENCOUNTER — Encounter: Payer: Self-pay | Admitting: Internal Medicine

## 2024-03-31 DIAGNOSIS — M797 Fibromyalgia: Secondary | ICD-10-CM

## 2024-04-03 MED ORDER — SAVELLA 100 MG PO TABS: 100.0000 mg | ORAL_TABLET | Freq: Two times a day (BID) | ORAL | 0 refills | Status: DC

## 2024-04-03 NOTE — Telephone Encounter (Signed)
 1. Fibromyalgia (Primary) - Milnacipran  HCl (SAVELLA ) 100 MG TABS tablet; Take 1 tablet (100 mg total) by mouth 2 (two) times daily.  Dispense: 60 tablet; Refill: 0 - Has appointment with PCP on 04/18/24. Recommend discuss risks related with taking Savella  and Trazodone .   Luke Shade, MD (covering for Dr. Marylynn)

## 2024-04-18 ENCOUNTER — Ambulatory Visit: Admitting: Internal Medicine

## 2024-04-18 ENCOUNTER — Encounter: Payer: Self-pay | Admitting: Internal Medicine

## 2024-04-18 VITALS — BP 130/98 | HR 92 | Temp 98.6°F | Ht 67.0 in | Wt 175.6 lb

## 2024-04-18 DIAGNOSIS — G8929 Other chronic pain: Secondary | ICD-10-CM | POA: Diagnosis not present

## 2024-04-18 DIAGNOSIS — M25562 Pain in left knee: Secondary | ICD-10-CM | POA: Diagnosis not present

## 2024-04-18 DIAGNOSIS — I10 Essential (primary) hypertension: Secondary | ICD-10-CM

## 2024-04-18 DIAGNOSIS — F4310 Post-traumatic stress disorder, unspecified: Secondary | ICD-10-CM | POA: Diagnosis not present

## 2024-04-18 DIAGNOSIS — F17200 Nicotine dependence, unspecified, uncomplicated: Secondary | ICD-10-CM

## 2024-04-18 MED ORDER — TRAMADOL HCL 50 MG PO TABS: 50.0000 mg | ORAL_TABLET | Freq: Four times a day (QID) | ORAL | 0 refills | Status: AC | PRN

## 2024-04-18 NOTE — Progress Notes (Unsigned)
 "  Subjective:  Patient ID: Allison Alvarez, female    DOB: [birth year]  Age: 64 y.o. MRN: [id number]  CC: There were no encounter diagnoses.   HPI Allison Alvarez presents for  Chief Complaint  Patient presents with   Medical Management of Chronic Issues   Patient has history of PTSD, returned to work on Dec 1.  Here for follow up.  Doing fine,  in therapy 4th session tomorrow with a specialist in 2ndayr trauma.  Has been better at [employer],  eating better,  but smoking 4-5 cigs  daily when at work ,  10 at home and VAPING .  The nightmares have improved. But feels fatigued every day , worse when at work   not sleepy,  just TIRED.  Still discussing how to move forward   Knee pain left,  went to PT 3 times and stopped .  Trouble walking up stops   Excruciating hip pain woke her up last weekend   Had the flu after returning to work  in [facility] December. , 15 month old grandson    Outpatient Medications Prior to Visit  Medication Sig Dispense Refill   acetaminophen  (TYLENOL ) 325 MG tablet Take 650 mg by mouth every 6 (six) hours as needed for moderate pain.     ALPRAZolam  (XANAX ) 0.25 MG tablet Take 1 tablet (0.25 mg total) by mouth 2 (two) times daily as needed for anxiety. 60 tablet 5   amLODipine  (NORVASC ) 5 MG tablet Take 1 tablet (5 mg total) by mouth daily. 90 tablet 1   aspirin  EC 81 MG tablet Take 1 tablet (81 mg total) by mouth daily. Swallow whole. 30 tablet 12   busPIRone  (BUSPAR ) 15 MG tablet Take 1 tablet (15 mg total) by mouth 3 (three) times daily. 270 tablet 0   carvedilol  (COREG ) 12.5 MG tablet Take 1 tablet (12.5 mg total) by mouth 2 (two) times daily with a meal. 60 tablet 3   cyanocobalamin  (VITAMIN B12) 1000 MCG/ML injection INJECT 1 ML INTRAMUSCULARLY EVERY 30 DAYS 3 mL 1   diclofenac  Sodium (VOLTAREN  ARTHRITIS PAIN) 1 % GEL Apply 4 g topically 4 (four) times daily. 150 g 0   gabapentin  (NEURONTIN ) 100 MG capsule Take 1 capsule (100 mg total) by mouth 2 (two) times  daily. 180 capsule 1   gabapentin  (NEURONTIN ) 300 MG capsule TAKE 1 CAPSULE BY MOUTH EVERY EVENING 30 capsule 3   Milnacipran  HCl (SAVELLA ) 100 MG TABS tablet Take 1 tablet (100 mg total) by mouth 2 (two) times daily. 60 tablet 0   nicotine  (NICODERM CQ  - DOSED IN MG/24 HOURS) 14 mg/24hr patch Place 1 patch (14 mg total) onto the skin daily. 30 patch 5   nitrofurantoin , macrocrystal-monohydrate, (MACROBID ) 100 MG capsule Take 1 capsule (100 mg total) by mouth 2 (two) times daily. 10 capsule 0   ondansetron  (ZOFRAN -ODT) 4 MG disintegrating tablet Take 1 tablet (4 mg total) by mouth every 8 (eight) hours as needed for nausea or vomiting. 20 tablet 0   oseltamivir  (TAMIFLU ) 75 MG capsule Take 1 capsule (75 mg total) by mouth every 12 (twelve) hours. 10 capsule 0   predniSONE  (STERAPRED UNI-PAK 21 TAB) 10 MG (21) TBPK tablet Take by mouth daily. Take 6 tabs by mouth daily  for 1 days, then 5 tabs for 1 days, then 4 tabs for 1 days, then 3 tabs for 1 days, 2 tabs for 1 days, then 1 tab by mouth daily for 1 days 21 tablet 0  rOPINIRole  (REQUIP ) 0.25 MG tablet TAKE ONE TABLET BY MOUTH EVERY EVENING AFTER DINNER AND AT BEDTIME *MAY INCREASE WEEKLY AS NEEDED* 60 tablet 5   rosuvastatin  (CRESTOR ) 10 MG tablet Take 1 tablet (10 mg total) by mouth daily. 90 tablet 3   telmisartan  (MICARDIS ) 80 MG tablet Take 1 tablet (80 mg total) by mouth daily. 90 tablet 1   traZODone  (DESYREL ) 150 MG tablet Take 0.5 tablets (75 mg total) by mouth at bedtime. 90 tablet 3   No facility-administered medications prior to visit.    Review of Systems;  Patient denies headache, fevers, malaise, unintentional weight loss, skin rash, eye pain, sinus congestion and sinus pain, sore throat, dysphagia,  hemoptysis , cough, dyspnea, wheezing, chest pain, palpitations, orthopnea, edema, abdominal pain, nausea, melena, diarrhea, constipation, flank pain, dysuria, hematuria, urinary  Frequency, nocturia, numbness, tingling, seizures,   Focal weakness, Loss of consciousness,  Tremor, insomnia, depression, anxiety, and suicidal ideation.      Objective:  BP (!) 130/98   Pulse 92   Temp 98.6 F (37 C) (Oral)   Ht 5' 7 (1.702 m)   Wt 175 lb 9.6 oz (79.7 kg)   SpO2 94%   BMI 27.50 kg/m   BP Readings from Last 3 Encounters:  04/18/24 (!) 130/98  03/23/24 107/74  03/07/24 136/74    Wt Readings from Last 3 Encounters:  04/18/24 175 lb 9.6 oz (79.7 kg)  03/07/24 173 lb 9.6 oz (78.7 kg)  02/21/24 171 lb 12.8 oz (77.9 kg)    Physical Exam  Lab Results  Component Value Date   HGBA1C 5.8 09/29/2023   HGBA1C 5.5 09/17/2021    Lab Results  Component Value Date   CREATININE 0.82 11/22/2023   CREATININE 0.84 10/08/2023   CREATININE 0.92 09/29/2023    Lab Results  Component Value Date   WBC 9.5 11/22/2023   HGB 11.8 (L) 11/22/2023   HCT 35.5 (L) 11/22/2023   PLT 253 11/22/2023   GLUCOSE 132 (H) 11/22/2023   CHOL 128 10/09/2023   TRIG 98 10/09/2023   HDL 66 10/09/2023   LDLDIRECT 76.0 09/29/2023   LDLCALC 42 10/09/2023   ALT 28 10/08/2023   AST 25 10/08/2023   NA 142 11/22/2023   K 4.1 11/22/2023   CL 105 11/22/2023   CREATININE 0.82 11/22/2023   BUN 12 11/22/2023   CO2 28 11/22/2023   TSH 0.95 09/29/2023   INR 1.0 10/08/2023   HGBA1C 5.8 09/29/2023   MICROALBUR 2.1 (H) 09/29/2023    No results found.  Assessment & Plan:  .There are no diagnoses linked to this encounter.   I spent 34 minutes on the day of this face to face encounter reviewing patient's  most recent visit with cardiology,  nephrology,  and neurology,  prior relevant surgical and non surgical procedures, recent  labs and imaging studies, counseling on weight management,  reviewing the assessment and plan with patient, and post visit ordering and reviewing of  diagnostics and therapeutics with patient  .   Follow-up: No follow-ups on file.   Verneita LITTIE Kettering, MD "

## 2024-04-18 NOTE — Patient Instructions (Signed)
 MRI Knee ordered (left knee)  Tramadol  rx sent for use if hip pain worsens.
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
## 2024-04-19 NOTE — Assessment & Plan Note (Addendum)
 She is smoking 1/2 to 1 pack daily and  vaping.  Counselling given. She is unable to consider quitting at this time

## 2024-04-19 NOTE — Assessment & Plan Note (Signed)
 Home readings have been < 130/80 on current regimen of amlodipine  5 mg, telmisartan  80 mg and carvedilol   12.5 mg bid.  She did not tolerate 10 mg amlodipine  dose due to fluid retention.  Will add hydrochlorothiazide  if needed to lower diastolic readings.

## 2024-05-02 ENCOUNTER — Other Ambulatory Visit: Payer: Self-pay

## 2024-05-02 DIAGNOSIS — M797 Fibromyalgia: Secondary | ICD-10-CM

## 2024-05-02 MED ORDER — SAVELLA 100 MG PO TABS: 100.0000 mg | ORAL_TABLET | Freq: Two times a day (BID) | ORAL | 5 refills | Status: AC

## 2024-05-04 ENCOUNTER — Telehealth: Payer: Self-pay

## 2024-05-04 DIAGNOSIS — G8929 Other chronic pain: Secondary | ICD-10-CM

## 2024-05-04 NOTE — Telephone Encounter (Signed)
 Copied from CRM #[id number]. Topic: Referral - Status >> May 04, 2024 10:02 AM Antony RAMAN wrote: Reason for CRM: [facility] tomorrow 1/23 needs to be authorized with her sara lee

## 2024-05-05 ENCOUNTER — Ambulatory Visit

## 2024-05-06 ENCOUNTER — Other Ambulatory Visit: Payer: Self-pay | Admitting: Internal Medicine

## 2024-05-08 NOTE — Telephone Encounter (Signed)
 LMTCB

## 2024-05-09 ENCOUNTER — Other Ambulatory Visit: Payer: Self-pay | Admitting: Internal Medicine

## 2024-05-09 ENCOUNTER — Ambulatory Visit
Admission: RE | Admit: 2024-05-09 | Discharge: 2024-05-09 | Disposition: A | Source: Ambulatory Visit | Attending: Internal Medicine | Admitting: Internal Medicine

## 2024-05-09 ENCOUNTER — Ambulatory Visit
Admission: RE | Admit: 2024-05-09 | Discharge: 2024-05-09 | Disposition: A | Discharge status: Home / Self Care | Attending: Internal Medicine | Admitting: Internal Medicine

## 2024-05-09 DIAGNOSIS — G8929 Other chronic pain: Secondary | ICD-10-CM

## 2024-05-09 DIAGNOSIS — M25562 Pain in left knee: Secondary | ICD-10-CM | POA: Diagnosis present

## 2024-05-09 NOTE — Telephone Encounter (Signed)
 Spoke with pt to let her know that in order for insurance to cover the MRI she will have to have a new xray and 6 weeks on PT. Pt gave a verbal understanding and stated that she will go get the xray and would like to have the PT done at [facility].

## 2024-05-09 NOTE — Telephone Encounter (Signed)
 Pt is aware and gave a verbal understanding.

## 2024-05-10 ENCOUNTER — Other Ambulatory Visit: Payer: Self-pay | Admitting: Internal Medicine

## 2024-05-11 ENCOUNTER — Ambulatory Visit: Payer: Self-pay | Admitting: Internal Medicine

## 2024-05-17 ENCOUNTER — Encounter: Payer: Self-pay | Admitting: Internal Medicine
